# Patient Record
Sex: Female | Born: 1955 | Race: White | Hispanic: No | Marital: Single | Smoking: Current every day smoker
Health system: Southern US, Community
[De-identification: ages and names within clinical notes are randomized; demographics above are authoritative.]

## PROBLEM LIST (undated history)

## (undated) DIAGNOSIS — D649 Anemia, unspecified: Secondary | ICD-10-CM

## (undated) DIAGNOSIS — N189 Chronic kidney disease, unspecified: Secondary | ICD-10-CM

## (undated) DIAGNOSIS — E119 Type 2 diabetes mellitus without complications: Secondary | ICD-10-CM

## (undated) DIAGNOSIS — M1990 Unspecified osteoarthritis, unspecified site: Secondary | ICD-10-CM

## (undated) DIAGNOSIS — C801 Malignant (primary) neoplasm, unspecified: Secondary | ICD-10-CM

## (undated) DIAGNOSIS — M4800 Spinal stenosis, site unspecified: Secondary | ICD-10-CM

## (undated) DIAGNOSIS — Z87442 Personal history of urinary calculi: Secondary | ICD-10-CM

## (undated) DIAGNOSIS — J449 Chronic obstructive pulmonary disease, unspecified: Secondary | ICD-10-CM

## (undated) DIAGNOSIS — E039 Hypothyroidism, unspecified: Secondary | ICD-10-CM

## (undated) DIAGNOSIS — I639 Cerebral infarction, unspecified: Secondary | ICD-10-CM

## (undated) DIAGNOSIS — F319 Bipolar disorder, unspecified: Secondary | ICD-10-CM

## (undated) DIAGNOSIS — F4310 Post-traumatic stress disorder, unspecified: Secondary | ICD-10-CM

## (undated) DIAGNOSIS — I10 Essential (primary) hypertension: Secondary | ICD-10-CM

## (undated) DIAGNOSIS — F209 Schizophrenia, unspecified: Secondary | ICD-10-CM

## (undated) DIAGNOSIS — R0600 Dyspnea, unspecified: Secondary | ICD-10-CM

## (undated) DIAGNOSIS — E079 Disorder of thyroid, unspecified: Secondary | ICD-10-CM

## (undated) DIAGNOSIS — K219 Gastro-esophageal reflux disease without esophagitis: Secondary | ICD-10-CM

## (undated) DIAGNOSIS — E785 Hyperlipidemia, unspecified: Secondary | ICD-10-CM

## (undated) DIAGNOSIS — J189 Pneumonia, unspecified organism: Secondary | ICD-10-CM

## (undated) HISTORY — DX: Hyperlipidemia, unspecified: E78.5

## (undated) HISTORY — PX: TUBAL LIGATION: SHX77

## (undated) HISTORY — DX: Spinal stenosis, site unspecified: M48.00

## (undated) HISTORY — DX: Type 2 diabetes mellitus without complications: E11.9

## (undated) HISTORY — DX: Essential (primary) hypertension: I10

## (undated) HISTORY — PX: BREAST BIOPSY: SHX20

## (undated) HISTORY — DX: Bipolar disorder, unspecified: F31.9

## (undated) HISTORY — DX: Schizophrenia, unspecified: F20.9

## (undated) HISTORY — DX: Disorder of thyroid, unspecified: E07.9

## (undated) HISTORY — PX: CHOLECYSTECTOMY: SHX55

## (undated) HISTORY — DX: Post-traumatic stress disorder, unspecified: F43.10

## (undated) HISTORY — PX: ABDOMINAL HYSTERECTOMY: SHX81

## (undated) HISTORY — PX: BACK SURGERY: SHX140

---

## 1986-04-23 DIAGNOSIS — Z9889 Other specified postprocedural states: Secondary | ICD-10-CM

## 1986-04-23 HISTORY — PX: BREAST EXCISIONAL BIOPSY: SUR124

## 1986-04-23 HISTORY — DX: Other specified postprocedural states: Z98.890

## 2004-06-09 ENCOUNTER — Emergency Department: Payer: Self-pay | Admitting: Emergency Medicine

## 2004-09-12 ENCOUNTER — Emergency Department: Payer: Self-pay | Admitting: Emergency Medicine

## 2005-05-27 ENCOUNTER — Emergency Department: Payer: Self-pay | Admitting: Emergency Medicine

## 2005-05-31 ENCOUNTER — Ambulatory Visit: Payer: Self-pay | Admitting: Family Medicine

## 2005-08-13 ENCOUNTER — Ambulatory Visit: Payer: Self-pay | Admitting: Unknown Physician Specialty

## 2005-08-21 ENCOUNTER — Ambulatory Visit: Payer: Self-pay | Admitting: Unknown Physician Specialty

## 2006-02-11 ENCOUNTER — Emergency Department: Payer: Self-pay | Admitting: Emergency Medicine

## 2008-06-09 ENCOUNTER — Emergency Department: Payer: Self-pay | Admitting: Unknown Physician Specialty

## 2008-06-16 ENCOUNTER — Ambulatory Visit: Payer: Self-pay | Admitting: General Practice

## 2008-12-16 ENCOUNTER — Inpatient Hospital Stay: Payer: Self-pay | Admitting: Specialist

## 2009-09-20 ENCOUNTER — Ambulatory Visit: Payer: Self-pay | Admitting: Internal Medicine

## 2010-02-25 ENCOUNTER — Emergency Department: Payer: Self-pay | Admitting: Emergency Medicine

## 2013-10-27 ENCOUNTER — Ambulatory Visit: Payer: Self-pay | Admitting: Family Medicine

## 2013-10-27 DIAGNOSIS — I517 Cardiomegaly: Secondary | ICD-10-CM

## 2013-10-28 ENCOUNTER — Encounter: Payer: Self-pay | Admitting: Cardiovascular Disease

## 2013-10-29 ENCOUNTER — Ambulatory Visit: Payer: Self-pay | Admitting: Family Medicine

## 2013-11-04 ENCOUNTER — Ambulatory Visit: Payer: Self-pay | Admitting: Family Medicine

## 2014-01-04 ENCOUNTER — Emergency Department: Payer: Self-pay | Admitting: Emergency Medicine

## 2014-03-23 ENCOUNTER — Ambulatory Visit: Payer: Self-pay

## 2014-09-13 DIAGNOSIS — I10 Essential (primary) hypertension: Secondary | ICD-10-CM | POA: Insufficient documentation

## 2014-09-13 DIAGNOSIS — E039 Hypothyroidism, unspecified: Secondary | ICD-10-CM | POA: Insufficient documentation

## 2014-09-13 DIAGNOSIS — E1140 Type 2 diabetes mellitus with diabetic neuropathy, unspecified: Secondary | ICD-10-CM | POA: Insufficient documentation

## 2014-11-28 DIAGNOSIS — J309 Allergic rhinitis, unspecified: Secondary | ICD-10-CM | POA: Insufficient documentation

## 2014-12-13 ENCOUNTER — Other Ambulatory Visit: Payer: Self-pay | Admitting: Orthopedic Surgery

## 2014-12-13 DIAGNOSIS — M79605 Pain in left leg: Secondary | ICD-10-CM

## 2014-12-13 DIAGNOSIS — G8929 Other chronic pain: Secondary | ICD-10-CM

## 2014-12-13 DIAGNOSIS — M545 Low back pain: Principal | ICD-10-CM

## 2014-12-20 ENCOUNTER — Ambulatory Visit
Admission: RE | Admit: 2014-12-20 | Discharge: 2014-12-20 | Disposition: A | Discharge status: Home / Self Care | Payer: Medicare Other | Source: Ambulatory Visit | Attending: Orthopedic Surgery | Admitting: Orthopedic Surgery

## 2014-12-20 DIAGNOSIS — M419 Scoliosis, unspecified: Secondary | ICD-10-CM | POA: Insufficient documentation

## 2014-12-20 DIAGNOSIS — M545 Low back pain: Secondary | ICD-10-CM

## 2014-12-20 DIAGNOSIS — M79605 Pain in left leg: Secondary | ICD-10-CM

## 2014-12-20 DIAGNOSIS — M47896 Other spondylosis, lumbar region: Secondary | ICD-10-CM | POA: Diagnosis not present

## 2014-12-20 DIAGNOSIS — M5136 Other intervertebral disc degeneration, lumbar region: Secondary | ICD-10-CM | POA: Diagnosis not present

## 2014-12-20 DIAGNOSIS — G8929 Other chronic pain: Secondary | ICD-10-CM

## 2014-12-24 ENCOUNTER — Other Ambulatory Visit: Payer: Self-pay | Admitting: Family Medicine

## 2014-12-24 DIAGNOSIS — Z1231 Encounter for screening mammogram for malignant neoplasm of breast: Secondary | ICD-10-CM

## 2014-12-29 ENCOUNTER — Ambulatory Visit
Admission: RE | Admit: 2014-12-29 | Discharge: 2014-12-29 | Disposition: A | Discharge status: Home / Self Care | Payer: Medicare Other | Source: Ambulatory Visit | Attending: Family Medicine | Admitting: Family Medicine

## 2014-12-29 ENCOUNTER — Other Ambulatory Visit: Payer: Self-pay | Admitting: Family Medicine

## 2014-12-29 DIAGNOSIS — Z1231 Encounter for screening mammogram for malignant neoplasm of breast: Secondary | ICD-10-CM | POA: Insufficient documentation

## 2014-12-29 HISTORY — DX: Malignant (primary) neoplasm, unspecified: C80.1

## 2015-05-04 DIAGNOSIS — M48061 Spinal stenosis, lumbar region without neurogenic claudication: Secondary | ICD-10-CM | POA: Insufficient documentation

## 2015-06-10 ENCOUNTER — Other Ambulatory Visit: Payer: Self-pay | Admitting: Neurosurgery

## 2015-06-10 DIAGNOSIS — M545 Low back pain: Secondary | ICD-10-CM

## 2015-06-10 DIAGNOSIS — M5412 Radiculopathy, cervical region: Secondary | ICD-10-CM

## 2015-06-22 ENCOUNTER — Ambulatory Visit
Admission: RE | Admit: 2015-06-22 | Discharge: 2015-06-22 | Disposition: A | Discharge status: Home / Self Care | Payer: Medicare Other | Source: Ambulatory Visit | Attending: Neurosurgery | Admitting: Neurosurgery

## 2015-06-22 ENCOUNTER — Other Ambulatory Visit: Payer: Self-pay | Admitting: Neurosurgery

## 2015-06-22 DIAGNOSIS — M545 Low back pain: Secondary | ICD-10-CM

## 2015-06-22 DIAGNOSIS — M5412 Radiculopathy, cervical region: Secondary | ICD-10-CM

## 2015-09-23 DIAGNOSIS — K219 Gastro-esophageal reflux disease without esophagitis: Secondary | ICD-10-CM | POA: Insufficient documentation

## 2016-02-08 ENCOUNTER — Other Ambulatory Visit: Payer: Self-pay | Admitting: Family Medicine

## 2016-02-08 DIAGNOSIS — N644 Mastodynia: Secondary | ICD-10-CM

## 2016-02-08 DIAGNOSIS — Z1239 Encounter for other screening for malignant neoplasm of breast: Secondary | ICD-10-CM

## 2018-12-19 DIAGNOSIS — M25542 Pain in joints of left hand: Secondary | ICD-10-CM | POA: Insufficient documentation

## 2018-12-19 DIAGNOSIS — R197 Diarrhea, unspecified: Secondary | ICD-10-CM | POA: Insufficient documentation

## 2018-12-19 DIAGNOSIS — J302 Other seasonal allergic rhinitis: Secondary | ICD-10-CM | POA: Insufficient documentation

## 2018-12-19 DIAGNOSIS — R87619 Unspecified abnormal cytological findings in specimens from cervix uteri: Secondary | ICD-10-CM | POA: Insufficient documentation

## 2018-12-19 DIAGNOSIS — M25541 Pain in joints of right hand: Secondary | ICD-10-CM | POA: Insufficient documentation

## 2018-12-19 DIAGNOSIS — N644 Mastodynia: Secondary | ICD-10-CM | POA: Insufficient documentation

## 2018-12-19 DIAGNOSIS — R1310 Dysphagia, unspecified: Secondary | ICD-10-CM | POA: Insufficient documentation

## 2018-12-19 DIAGNOSIS — R202 Paresthesia of skin: Secondary | ICD-10-CM | POA: Insufficient documentation

## 2019-02-25 LAB — HM COLONOSCOPY

## 2019-05-20 DIAGNOSIS — K644 Residual hemorrhoidal skin tags: Secondary | ICD-10-CM | POA: Insufficient documentation

## 2019-05-20 DIAGNOSIS — K5790 Diverticulosis of intestine, part unspecified, without perforation or abscess without bleeding: Secondary | ICD-10-CM | POA: Insufficient documentation

## 2019-05-22 DIAGNOSIS — M720 Palmar fascial fibromatosis [Dupuytren]: Secondary | ICD-10-CM | POA: Insufficient documentation

## 2019-05-22 DIAGNOSIS — Z8673 Personal history of transient ischemic attack (TIA), and cerebral infarction without residual deficits: Secondary | ICD-10-CM | POA: Insufficient documentation

## 2019-05-22 DIAGNOSIS — G252 Other specified forms of tremor: Secondary | ICD-10-CM | POA: Insufficient documentation

## 2019-07-30 DIAGNOSIS — R200 Anesthesia of skin: Secondary | ICD-10-CM | POA: Insufficient documentation

## 2019-08-13 DIAGNOSIS — M5441 Lumbago with sciatica, right side: Secondary | ICD-10-CM | POA: Insufficient documentation

## 2019-08-13 DIAGNOSIS — M5416 Radiculopathy, lumbar region: Secondary | ICD-10-CM | POA: Insufficient documentation

## 2019-08-13 DIAGNOSIS — G8929 Other chronic pain: Secondary | ICD-10-CM | POA: Insufficient documentation

## 2019-08-13 DIAGNOSIS — M542 Cervicalgia: Secondary | ICD-10-CM | POA: Insufficient documentation

## 2019-08-13 DIAGNOSIS — M5412 Radiculopathy, cervical region: Secondary | ICD-10-CM | POA: Insufficient documentation

## 2019-09-09 DIAGNOSIS — Z90710 Acquired absence of both cervix and uterus: Secondary | ICD-10-CM | POA: Insufficient documentation

## 2019-09-09 DIAGNOSIS — N1832 Chronic kidney disease, stage 3b: Secondary | ICD-10-CM | POA: Insufficient documentation

## 2019-09-16 DIAGNOSIS — R200 Anesthesia of skin: Secondary | ICD-10-CM | POA: Insufficient documentation

## 2019-09-23 ENCOUNTER — Other Ambulatory Visit: Payer: Self-pay | Admitting: Physician Assistant

## 2019-09-23 DIAGNOSIS — Z780 Asymptomatic menopausal state: Secondary | ICD-10-CM

## 2019-11-11 ENCOUNTER — Other Ambulatory Visit: Payer: Self-pay

## 2019-11-11 DIAGNOSIS — I700 Atherosclerosis of aorta: Secondary | ICD-10-CM | POA: Diagnosis present

## 2019-11-11 DIAGNOSIS — E1142 Type 2 diabetes mellitus with diabetic polyneuropathy: Secondary | ICD-10-CM | POA: Diagnosis present

## 2019-11-11 DIAGNOSIS — Z20822 Contact with and (suspected) exposure to covid-19: Secondary | ICD-10-CM | POA: Diagnosis present

## 2019-11-11 DIAGNOSIS — L03311 Cellulitis of abdominal wall: Principal | ICD-10-CM | POA: Diagnosis present

## 2019-11-11 DIAGNOSIS — F209 Schizophrenia, unspecified: Secondary | ICD-10-CM | POA: Diagnosis present

## 2019-11-11 DIAGNOSIS — I10 Essential (primary) hypertension: Secondary | ICD-10-CM | POA: Diagnosis present

## 2019-11-11 DIAGNOSIS — L02211 Cutaneous abscess of abdominal wall: Secondary | ICD-10-CM | POA: Diagnosis not present

## 2019-11-11 DIAGNOSIS — Z90710 Acquired absence of both cervix and uterus: Secondary | ICD-10-CM

## 2019-11-11 DIAGNOSIS — E785 Hyperlipidemia, unspecified: Secondary | ICD-10-CM | POA: Diagnosis present

## 2019-11-11 DIAGNOSIS — F319 Bipolar disorder, unspecified: Secondary | ICD-10-CM | POA: Diagnosis present

## 2019-11-11 DIAGNOSIS — Z833 Family history of diabetes mellitus: Secondary | ICD-10-CM

## 2019-11-11 DIAGNOSIS — E039 Hypothyroidism, unspecified: Secondary | ICD-10-CM | POA: Diagnosis present

## 2019-11-11 DIAGNOSIS — Z9049 Acquired absence of other specified parts of digestive tract: Secondary | ICD-10-CM

## 2019-11-11 DIAGNOSIS — Z8673 Personal history of transient ischemic attack (TIA), and cerebral infarction without residual deficits: Secondary | ICD-10-CM

## 2019-11-11 DIAGNOSIS — E1165 Type 2 diabetes mellitus with hyperglycemia: Secondary | ICD-10-CM | POA: Diagnosis present

## 2019-11-11 DIAGNOSIS — Z8249 Family history of ischemic heart disease and other diseases of the circulatory system: Secondary | ICD-10-CM

## 2019-11-11 DIAGNOSIS — F17210 Nicotine dependence, cigarettes, uncomplicated: Secondary | ICD-10-CM | POA: Diagnosis present

## 2019-11-11 DIAGNOSIS — F4310 Post-traumatic stress disorder, unspecified: Secondary | ICD-10-CM | POA: Diagnosis present

## 2019-11-11 NOTE — ED Triage Notes (Signed)
Pt with daughter who reports pt had several wounds drained on Monday and pt to ED tonight due to wound drainage and possible infection to the right lower abdomen.

## 2019-11-12 ENCOUNTER — Inpatient Hospital Stay
Admission: EM | Admit: 2019-11-12 | Discharge: 2019-11-14 | DRG: 603 | Disposition: A | Discharge status: Home / Self Care | Payer: Medicare Other | Attending: Internal Medicine | Admitting: Internal Medicine

## 2019-11-12 ENCOUNTER — Encounter: Payer: Self-pay | Admitting: Family Medicine

## 2019-11-12 ENCOUNTER — Inpatient Hospital Stay: Payer: Medicare Other

## 2019-11-12 DIAGNOSIS — Z90710 Acquired absence of both cervix and uterus: Secondary | ICD-10-CM | POA: Diagnosis not present

## 2019-11-12 DIAGNOSIS — Z8249 Family history of ischemic heart disease and other diseases of the circulatory system: Secondary | ICD-10-CM | POA: Diagnosis not present

## 2019-11-12 DIAGNOSIS — E1140 Type 2 diabetes mellitus with diabetic neuropathy, unspecified: Secondary | ICD-10-CM | POA: Diagnosis not present

## 2019-11-12 DIAGNOSIS — I10 Essential (primary) hypertension: Secondary | ICD-10-CM

## 2019-11-12 DIAGNOSIS — E039 Hypothyroidism, unspecified: Secondary | ICD-10-CM

## 2019-11-12 DIAGNOSIS — E1142 Type 2 diabetes mellitus with diabetic polyneuropathy: Secondary | ICD-10-CM | POA: Diagnosis present

## 2019-11-12 DIAGNOSIS — E785 Hyperlipidemia, unspecified: Secondary | ICD-10-CM | POA: Diagnosis present

## 2019-11-12 DIAGNOSIS — Z833 Family history of diabetes mellitus: Secondary | ICD-10-CM | POA: Diagnosis not present

## 2019-11-12 DIAGNOSIS — E1165 Type 2 diabetes mellitus with hyperglycemia: Secondary | ICD-10-CM | POA: Diagnosis present

## 2019-11-12 DIAGNOSIS — F209 Schizophrenia, unspecified: Secondary | ICD-10-CM | POA: Diagnosis present

## 2019-11-12 DIAGNOSIS — F319 Bipolar disorder, unspecified: Secondary | ICD-10-CM | POA: Diagnosis present

## 2019-11-12 DIAGNOSIS — L03311 Cellulitis of abdominal wall: Secondary | ICD-10-CM | POA: Diagnosis present

## 2019-11-12 DIAGNOSIS — Z20822 Contact with and (suspected) exposure to covid-19: Secondary | ICD-10-CM | POA: Diagnosis present

## 2019-11-12 DIAGNOSIS — I700 Atherosclerosis of aorta: Secondary | ICD-10-CM | POA: Diagnosis present

## 2019-11-12 DIAGNOSIS — Z8673 Personal history of transient ischemic attack (TIA), and cerebral infarction without residual deficits: Secondary | ICD-10-CM | POA: Diagnosis not present

## 2019-11-12 DIAGNOSIS — L02211 Cutaneous abscess of abdominal wall: Secondary | ICD-10-CM | POA: Insufficient documentation

## 2019-11-12 DIAGNOSIS — F4310 Post-traumatic stress disorder, unspecified: Secondary | ICD-10-CM | POA: Diagnosis present

## 2019-11-12 DIAGNOSIS — Z9049 Acquired absence of other specified parts of digestive tract: Secondary | ICD-10-CM | POA: Diagnosis not present

## 2019-11-12 DIAGNOSIS — F17210 Nicotine dependence, cigarettes, uncomplicated: Secondary | ICD-10-CM | POA: Diagnosis present

## 2019-11-12 LAB — COMPREHENSIVE METABOLIC PANEL
ALT: 21 U/L (ref 0–44)
AST: 18 U/L (ref 15–41)
Albumin: 4.8 g/dL (ref 3.5–5.0)
Alkaline Phosphatase: 109 U/L (ref 38–126)
Anion gap: 8 (ref 5–15)
BUN: 13 mg/dL (ref 8–23)
CO2: 28 mmol/L (ref 22–32)
Calcium: 9.9 mg/dL (ref 8.9–10.3)
Chloride: 101 mmol/L (ref 98–111)
Creatinine, Ser: 0.97 mg/dL (ref 0.44–1.00)
GFR calc Af Amer: >60 mL/min (ref >60)
GFR calc non Af Amer: >60 mL/min (ref >60)
Glucose, Bld: 107 mg/dL — ABNORMAL HIGH (ref 70–99)
Potassium: 3.8 mmol/L (ref 3.5–5.1)
Sodium: 137 mmol/L (ref 135–145)
Total Bilirubin: 0.6 mg/dL (ref 0.3–1.2)
Total Protein: 8.2 g/dL — ABNORMAL HIGH (ref 6.5–8.1)

## 2019-11-12 LAB — BASIC METABOLIC PANEL
Anion gap: 7 (ref 5–15)
BUN: 12 mg/dL (ref 8–23)
CO2: 28 mmol/L (ref 22–32)
Calcium: 9.3 mg/dL (ref 8.9–10.3)
Chloride: 104 mmol/L (ref 98–111)
Creatinine, Ser: 0.84 mg/dL (ref 0.44–1.00)
GFR calc Af Amer: >60 mL/min (ref >60)
GFR calc non Af Amer: >60 mL/min (ref >60)
Glucose, Bld: 113 mg/dL — ABNORMAL HIGH (ref 70–99)
Potassium: 4.2 mmol/L (ref 3.5–5.1)
Sodium: 139 mmol/L (ref 135–145)

## 2019-11-12 LAB — GLUCOSE, CAPILLARY
Glucose-Capillary: 81 mg/dL (ref 70–99)
Glucose-Capillary: 178 mg/dL — ABNORMAL HIGH (ref 70–99)
Glucose-Capillary: 91 mg/dL (ref 70–99)
Glucose-Capillary: 154 mg/dL — ABNORMAL HIGH (ref 70–99)

## 2019-11-12 LAB — CBC WITH DIFFERENTIAL/PLATELET
Abs Immature Granulocytes: 0.02 10*3/uL (ref 0.00–0.07)
Basophils Absolute: 0 10*3/uL (ref 0.0–0.1)
Basophils Relative: 0 %
Eosinophils Absolute: 0 10*3/uL (ref 0.0–0.5)
Eosinophils Relative: 0 %
HCT: 39.5 % (ref 36.0–46.0)
Hemoglobin: 13.6 g/dL (ref 12.0–15.0)
Immature Granulocytes: 0 %
Lymphocytes Relative: 43 %
Lymphs Abs: 3.8 10*3/uL (ref 0.7–4.0)
MCH: 30.4 pg (ref 26.0–34.0)
MCHC: 34.4 g/dL (ref 30.0–36.0)
MCV: 88.2 fL (ref 80.0–100.0)
Monocytes Absolute: 0.6 10*3/uL (ref 0.1–1.0)
Monocytes Relative: 7 %
Neutro Abs: 4.4 10*3/uL (ref 1.7–7.7)
Neutrophils Relative %: 50 %
Platelets: 321 10*3/uL (ref 150–400)
RBC: 4.48 MIL/uL (ref 3.87–5.11)
RDW: 13.9 % (ref 11.5–15.5)
WBC: 8.9 10*3/uL (ref 4.0–10.5)
nRBC: 0 % (ref 0.0–0.2)

## 2019-11-12 LAB — CBC
HCT: 36.8 % (ref 36.0–46.0)
Hemoglobin: 12 g/dL (ref 12.0–15.0)
MCH: 29.7 pg (ref 26.0–34.0)
MCHC: 32.6 g/dL (ref 30.0–36.0)
MCV: 91.1 fL (ref 80.0–100.0)
Platelets: 297 10*3/uL (ref 150–400)
RBC: 4.04 MIL/uL (ref 3.87–5.11)
RDW: 13.7 % (ref 11.5–15.5)
WBC: 7.4 10*3/uL (ref 4.0–10.5)
nRBC: 0 % (ref 0.0–0.2)

## 2019-11-12 LAB — HEMOGLOBIN A1C
Hgb A1c MFr Bld: 8.4 % — ABNORMAL HIGH (ref 4.8–5.6)
Mean Plasma Glucose: 194.38 mg/dL

## 2019-11-12 LAB — LACTIC ACID, PLASMA: Lactic Acid, Venous: 1 mmol/L (ref 0.5–1.9)

## 2019-11-12 LAB — TSH: TSH: 3.352 u[IU]/mL (ref 0.350–4.500)

## 2019-11-12 LAB — HIV ANTIBODY (ROUTINE TESTING W REFLEX): HIV Screen 4th Generation wRfx: NONREACTIVE

## 2019-11-12 LAB — SARS CORONAVIRUS 2 BY RT PCR (HOSPITAL ORDER, PERFORMED IN ~~LOC~~ HOSPITAL LAB): SARS Coronavirus 2: NEGATIVE

## 2019-11-12 LAB — MRSA PCR SCREENING: MRSA by PCR: NEGATIVE

## 2019-11-12 MED ORDER — VANCOMYCIN HCL IN DEXTROSE 1-5 GM/200ML-% IV SOLN: 1000.0000 mg | Freq: Once | INTRAVENOUS | Status: DC

## 2019-11-12 MED ORDER — ENOXAPARIN SODIUM 40 MG/0.4ML ~~LOC~~ SOLN
40.0000 mg | SUBCUTANEOUS | Status: DC
  Administered 2019-11-12 – 2019-11-13 (×2): 40 mg via SUBCUTANEOUS
  Filled 2019-11-12 (×3): qty 0.4

## 2019-11-12 MED ORDER — ACETAMINOPHEN 650 MG RE SUPP: 650.0000 mg | Freq: Four times a day (QID) | RECTAL | Status: DC | PRN

## 2019-11-12 MED ORDER — ATORVASTATIN CALCIUM 20 MG PO TABS
80.0000 mg | ORAL_TABLET | Freq: Every day | ORAL | Status: DC
  Administered 2019-11-12 – 2019-11-14 (×3): 80 mg via ORAL
  Filled 2019-11-12 (×3): qty 4

## 2019-11-12 MED ORDER — TRAZODONE HCL 50 MG PO TABS: 25.0000 mg | ORAL_TABLET | Freq: Every evening | ORAL | Status: DC | PRN

## 2019-11-12 MED ORDER — GLIPIZIDE ER 5 MG PO TB24
5.0000 mg | ORAL_TABLET | Freq: Every day | ORAL | Status: DC
  Administered 2019-11-13 – 2019-11-14 (×2): 5 mg via ORAL
  Filled 2019-11-12 (×2): qty 1

## 2019-11-12 MED ORDER — MORPHINE SULFATE (PF) 2 MG/ML IV SOLN
2.0000 mg | INTRAVENOUS | Status: DC | PRN
  Administered 2019-11-12 – 2019-11-13 (×3): 2 mg via INTRAVENOUS
  Filled 2019-11-12 (×3): qty 1

## 2019-11-12 MED ORDER — ONDANSETRON HCL 4 MG/2ML IJ SOLN: 4.0000 mg | Freq: Four times a day (QID) | INTRAMUSCULAR | Status: DC | PRN

## 2019-11-12 MED ORDER — METOPROLOL SUCCINATE ER 50 MG PO TB24
50.0000 mg | ORAL_TABLET | Freq: Every day | ORAL | Status: DC
  Administered 2019-11-12 – 2019-11-14 (×3): 50 mg via ORAL
  Filled 2019-11-12 (×3): qty 1

## 2019-11-12 MED ORDER — FAMOTIDINE 20 MG PO TABS
40.0000 mg | ORAL_TABLET | Freq: Every day | ORAL | Status: DC
  Administered 2019-11-12 – 2019-11-14 (×3): 40 mg via ORAL
  Filled 2019-11-12 (×3): qty 2

## 2019-11-12 MED ORDER — ACETAMINOPHEN 325 MG PO TABS: 650.0000 mg | ORAL_TABLET | Freq: Four times a day (QID) | ORAL | Status: DC | PRN

## 2019-11-12 MED ORDER — TRAZODONE HCL 100 MG PO TABS
100.0000 mg | ORAL_TABLET | Freq: Every day | ORAL | Status: DC
  Administered 2019-11-12 – 2019-11-13 (×2): 100 mg via ORAL
  Filled 2019-11-12 (×2): qty 1

## 2019-11-12 MED ORDER — PIPERACILLIN-TAZOBACTAM 3.375 G IVPB 30 MIN
3.3750 g | Freq: Once | INTRAVENOUS | Status: AC
  Administered 2019-11-12: 3.375 g via INTRAVENOUS
  Filled 2019-11-12: qty 50

## 2019-11-12 MED ORDER — LAMOTRIGINE 100 MG PO TABS
200.0000 mg | ORAL_TABLET | Freq: Two times a day (BID) | ORAL | Status: DC
  Administered 2019-11-12 – 2019-11-14 (×5): 200 mg via ORAL
  Filled 2019-11-12 (×5): qty 2

## 2019-11-12 MED ORDER — GABAPENTIN 400 MG PO CAPS
800.0000 mg | ORAL_CAPSULE | Freq: Three times a day (TID) | ORAL | Status: DC
  Administered 2019-11-12 – 2019-11-14 (×5): 800 mg via ORAL
  Filled 2019-11-12 (×5): qty 2

## 2019-11-12 MED ORDER — LEVOTHYROXINE SODIUM 100 MCG PO TABS
100.0000 ug | ORAL_TABLET | Freq: Every day | ORAL | Status: DC
  Administered 2019-11-12 – 2019-11-14 (×3): 100 ug via ORAL
  Filled 2019-11-12: qty 2
  Filled 2019-11-12 (×2): qty 1

## 2019-11-12 MED ORDER — VANCOMYCIN HCL IN DEXTROSE 1-5 GM/200ML-% IV SOLN: 1000.0000 mg | INTRAVENOUS | Status: DC

## 2019-11-12 MED ORDER — VENLAFAXINE HCL ER 75 MG PO CP24
75.0000 mg | ORAL_CAPSULE | Freq: Every day | ORAL | Status: DC
  Administered 2019-11-13: 75 mg via ORAL
  Filled 2019-11-12: qty 1

## 2019-11-12 MED ORDER — SODIUM CHLORIDE 0.9 % IV SOLN
1.0000 g | Freq: Once | INTRAVENOUS | Status: AC
  Administered 2019-11-12: 1 g via INTRAVENOUS
  Filled 2019-11-12: qty 10

## 2019-11-12 MED ORDER — VARENICLINE TARTRATE 1 MG PO TABS
1.0000 mg | ORAL_TABLET | Freq: Two times a day (BID) | ORAL | Status: DC
  Administered 2019-11-12 – 2019-11-14 (×4): 1 mg via ORAL
  Filled 2019-11-12 (×5): qty 1

## 2019-11-12 MED ORDER — ONDANSETRON HCL 4 MG/2ML IJ SOLN
4.0000 mg | Freq: Once | INTRAMUSCULAR | Status: AC
  Administered 2019-11-12: 4 mg via INTRAVENOUS
  Filled 2019-11-12: qty 2

## 2019-11-12 MED ORDER — PIPERACILLIN-TAZOBACTAM 3.375 G IVPB
3.3750 g | Freq: Three times a day (TID) | INTRAVENOUS | Status: DC
  Administered 2019-11-12 – 2019-11-14 (×5): 3.375 g via INTRAVENOUS
  Filled 2019-11-12 (×5): qty 50

## 2019-11-12 MED ORDER — INSULIN ASPART 100 UNIT/ML ~~LOC~~ SOLN
0.0000 [IU] | Freq: Three times a day (TID) | SUBCUTANEOUS | Status: DC
  Administered 2019-11-12 (×2): 3 [IU] via SUBCUTANEOUS
  Administered 2019-11-13: 2 [IU] via SUBCUTANEOUS
  Administered 2019-11-13: 3 [IU] via SUBCUTANEOUS
  Administered 2019-11-14: 2 [IU] via SUBCUTANEOUS
  Filled 2019-11-12 (×5): qty 1

## 2019-11-12 MED ORDER — VANCOMYCIN HCL 1250 MG/250ML IV SOLN
1250.0000 mg | INTRAVENOUS | Status: DC
  Administered 2019-11-12 – 2019-11-13 (×2): 1250 mg via INTRAVENOUS
  Filled 2019-11-12 (×3): qty 250

## 2019-11-12 MED ORDER — MORPHINE SULFATE (PF) 4 MG/ML IV SOLN
4.0000 mg | Freq: Once | INTRAVENOUS | Status: AC
  Administered 2019-11-12: 4 mg via INTRAVENOUS
  Filled 2019-11-12: qty 1

## 2019-11-12 MED ORDER — SODIUM CHLORIDE 0.9 % IV SOLN: INTRAVENOUS | Status: DC

## 2019-11-12 MED ORDER — ONDANSETRON HCL 4 MG PO TABS: 4.0000 mg | ORAL_TABLET | Freq: Four times a day (QID) | ORAL | Status: DC | PRN

## 2019-11-12 MED ORDER — ALBUTEROL SULFATE (2.5 MG/3ML) 0.083% IN NEBU: 3.0000 mL | INHALATION_SOLUTION | Freq: Four times a day (QID) | RESPIRATORY_TRACT | Status: DC | PRN

## 2019-11-12 MED ORDER — FLUTICASONE PROPIONATE 50 MCG/ACT NA SUSP
2.0000 | Freq: Every day | NASAL | Status: DC
  Administered 2019-11-12 – 2019-11-14 (×3): 2 via NASAL
  Filled 2019-11-12: qty 16

## 2019-11-12 MED ORDER — IOHEXOL 300 MG/ML  SOLN
100.0000 mL | Freq: Once | INTRAMUSCULAR | Status: AC | PRN
  Administered 2019-11-12: 100 mL via INTRAVENOUS
  Filled 2019-11-12: qty 100

## 2019-11-12 MED ORDER — BUSPIRONE HCL 15 MG PO TABS
15.0000 mg | ORAL_TABLET | Freq: Three times a day (TID) | ORAL | Status: DC
  Administered 2019-11-12 – 2019-11-14 (×6): 15 mg via ORAL
  Filled 2019-11-12 (×6): qty 1
  Filled 2019-11-12: qty 3
  Filled 2019-11-12 (×2): qty 1

## 2019-11-12 MED ORDER — MAGNESIUM HYDROXIDE 400 MG/5ML PO SUSP: 30.0000 mL | Freq: Every day | ORAL | Status: DC | PRN

## 2019-11-12 MED ORDER — VANCOMYCIN HCL IN DEXTROSE 1-5 GM/200ML-% IV SOLN
1000.0000 mg | Freq: Once | INTRAVENOUS | Status: AC
  Administered 2019-11-12: 1000 mg via INTRAVENOUS
  Filled 2019-11-12: qty 200

## 2019-11-12 MED ORDER — OXYCODONE HCL 5 MG PO TABS
5.0000 mg | ORAL_TABLET | ORAL | Status: DC | PRN
  Administered 2019-11-12 – 2019-11-14 (×3): 5 mg via ORAL
  Filled 2019-11-12 (×3): qty 1

## 2019-11-12 NOTE — H&P (Addendum)
[place] [facility] [place] NAME: Mary Nicholson    MR#:  [id number]  DATE OF BIRTH:  [birth year]  DATE OF ADMISSION:  11/12/2019  PRIMARY CARE PHYSICIAN: [facility]   REQUESTING/REFERRING PHYSICIAN: Gonzella Lex, MD  CHIEF COMPLAINT:   Chief Complaint  Patient presents with  . Wound Infection    HISTORY OF PRESENT ILLNESS:  Mary Nicholson  is a 64 y.o. Caucasian female with a known history of type diabetes mellitus, hypertension, dyslipidemia and TIA, presented to the emergency room with acute onset of worsening abdominal wall cellulitis with erythema, warmth, tenderness, pain which have started several days ago and she underwent incision and drainage of an associated abscess on 7/19 with wound packing.  She denies any fever or chills.  She is given prescription for Bactrim DS that she has been taking.  No nausea or vomiting or diarrhea.  No chest pain or dyspnea palpitations or cough or wheezing.  Upon presentation to the emergency room, blood pressure was 188/87 and otherwise vital signs are within normal.  CMP is unremarkable.  COVID-19 PCR came back negative.  Blood cultures were drawn.  The patient was given IV ceftriaxone and vancomycin as well as 4 mg of IV morphine sulfate and 4 mg IV Zofran.  She will be admitted to a medical bed for further evaluation and management. PAST MEDICAL HISTORY:   Past Medical History:  Diagnosis Date  . Cancer    cervical  Type 2 diabetes mellitus, TIA, hypertension, dyslipidemia, hypothyroidism, PTSD, bipolar disorder and schizophrenia.  PAST SURGICAL HISTORY:   Past Surgical History:  Procedure Laterality Date  . BREAST BIOPSY Right    negative 1988  Bilateral tubal ligation, hysterectomy, cholecystectomy and back surgery.  SOCIAL HISTORY:   Social History   Tobacco Use  . Smoking status: Not on file  Substance Use Topics  . Alcohol use: Not on file  She occasionally  drinks beer.  She smokes 1 to 2 cigarettes/day using Chantix for cessation  FAMILY HISTORY:   Positive  for hypertension, diabetes mellitus, CVA, MI and cancer.  DRUG ALLERGIES:  No Known Allergies  REVIEW OF SYSTEMS:   ROS As per history of present illness. All pertinent systems were reviewed above. Constitutional, HEENT, cardiovascular, respiratory, GI, GU, musculoskeletal, neuro, psychiatric, endocrine, integumentary and hematologic systems were reviewed and are otherwise negative/unremarkable except for positive findings mentioned above in the HPI.   MEDICATIONS AT HOME:   Prior to Admission medications   Not on File      VITAL SIGNS:  Blood pressure (!) 188/87, pulse 78, temperature 97.7 F (36.5 C), temperature source Oral, resp. rate 16, SpO2 99 %.  PHYSICAL EXAMINATION:  Physical Exam  GENERAL:  64 y.o.-year-old Caucasian female patient lying in the bed with no acute distress.  EYES: Pupils equal, round, reactive to light and accommodation. No scleral icterus. Extraocular muscles intact.  HEENT: Head atraumatic, normocephalic. Oropharynx and nasopharynx clear.  NECK:  Supple, no jugular venous distention. No thyroid enlargement, no tenderness.  LUNGS: Normal breath sounds bilaterally, no wheezing, rales, rhonchi or crepitation. No use of accessory muscles of respiration.  CARDIOVASCULAR: Regular rate and rhythm, S1, S2 normal. No murmurs, rubs, or gallops.  ABDOMEN/skin: Soft, with lower abdominal wall erythema, induration, warmth and tenderness with abdominal wound status post I&D of an abscess that is currently packed.  She continues to have significant induration and tenderness around her abscess wound.  Bowel sounds present.  No organomegaly or mass.  EXTREMITIES: No pedal edema, cyanosis, or clubbing.  NEUROLOGIC: Cranial nerves II through XII are intact. Muscle strength 5/5 in all extremities. Sensation intact. Gait not checked.  PSYCHIATRIC: The patient is alert  and oriented x 3.  Normal affect and good eye contact. SKIN: As above.    LABORATORY PANEL:   CBC Recent Labs  Lab 11/12/19 0015  WBC 8.9  HGB 13.6  HCT 39.5  PLT 321   ------------------------------------------------------------------------------------------------------------------  Chemistries  Recent Labs  Lab 11/12/19 0015  NA 137  K 3.8  CL 101  CO2 28  GLUCOSE 107*  BUN 13  CREATININE 0.97  CALCIUM 9.9  AST 18  ALT 21  ALKPHOS 109  BILITOT 0.6   ------------------------------------------------------------------------------------------------------------------  Cardiac Enzymes No results for input(s): TROPONINI in the last 168 hours. ------------------------------------------------------------------------------------------------------------------  RADIOLOGY:  No results found.    IMPRESSION AND PLAN:   1.  Abdominal wall nonpurulent cellulitis which has been extending despite outpatient treatment with associated abscess status post incision and drainage. -The patient will be admitted to a medical bed. -Continue antibiotic therapy with IV vancomycin and Zosyn given her diabetes and immunosuppression. -Pain management to be provided. -Warm compresses will be utilized. -Wound care consult to be obtained. -General surgery consultation will be obtained. -I notified Dr. Celine Ahr about the patient.  2.  Type 2 diabetes mellitus with peripheral neuropathy. -We will continue Glucotrol XL and place the patient on supplemental coverage with NovoLog. -We will continue Neurontin. -Metformin will be held off.  3.  Hypertension. -We will continue her antihypertensives.  4.  Hypothyroidism. -Continue Synthroid and check TSH level.  5.  Bipolar disorder. -We will continue Lamictal.  6.  DVT prophylaxis. -Subtenons Lovenox   All the records are reviewed and case discussed with ED provider. The plan of care was discussed in details with the patient (and  family). I answered all questions. The patient agreed to proceed with the above mentioned plan. Further management will depend upon hospital course.   CODE STATUS: Full code  Status is: Inpatient  Remains inpatient appropriate because:Ongoing active pain requiring inpatient pain management, Ongoing diagnostic testing needed not appropriate for outpatient work up, Unsafe d/c plan, IV treatments appropriate due to intensity of illness or inability to take PO and Inpatient level of care appropriate due to severity of illness   Dispo: The patient is from: Home              Anticipated d/c is to: Home              Anticipated d/c date is: 2 days              Patient currently is not medically stable to d/c.   TOTAL TIME TAKING CARE OF THIS PATIENT: 55 minutes.    Christel Mormon M.D on 11/12/2019 at 3:33 AM  [facility]   From 7 PM-7 AM, contact night-coverage [email or web address]  CC: Primary care physician; Shari Prows, [facility]   Note: This dictation was prepared with Dragon dictation along with smaller phrase technology. Any transcriptional typo errors that result from this process are unintentional.

## 2019-11-12 NOTE — Progress Notes (Addendum)
Pharmacy Antibiotic Note  Mary Nicholson is a 64 y.o. female admitted on 11/12/2019 with cellulitis.  Pharmacy has been consulted for Vancomycin and Zosyn dosing.  Plan:  Continue Zosyn 3.375g IV q8h (4 hour infusion).   Will start Vancomycin 1250 mg IV Q 24 hrs based on [place] vancomycin nomogram.  Height: 5' (152.4 cm) Weight: 65.9 kg (145 lb 4.5 oz) IBW/kg (Calculated) : 45.5  Temp (24hrs), Avg:97.7 F (36.5 C), Min:97.7 F (36.5 C), Max:97.7 F (36.5 C)  Recent Labs  Lab 11/12/19 0015 11/12/19 0441  WBC 8.9 7.4  CREATININE 0.97 0.84  LATICACIDVEN 1.0  --     Estimated Creatinine Clearance: 55.8 mL/min (by C-G formula based on SCr of 0.84 mg/dL).    No Known Allergies  Antimicrobials this admission:  7/22 vancomycin >>   7/22 Zosyn >>   7/22 ceftriaxone x 1  Dose adjustments this admission:   Microbiology results:  BCx: NGTD  Thank you for allowing pharmacy to be a part of this patient's care.  Benn Moulder, PharmD Pharmacy Resident  11/12/2019 8:46 AM

## 2019-11-12 NOTE — ED Notes (Signed)
PT assisted to the bathroom. Able to stand with assistance.

## 2019-11-12 NOTE — ED Notes (Signed)
Family showed RN pictures of wound infection. Redness around lower abd appears similar to when wound was opened and packed. Immediately around wound opening appears a darker color of red with yellow drainage that was not present yesterday. Pt reports she has been compliant with her antibiotics. No fevers at home but pain has been worsening.

## 2019-11-12 NOTE — ED Notes (Signed)
Pharmacy tech informed of need for med rec.

## 2019-11-12 NOTE — Progress Notes (Signed)
Patient ID: Mary Nicholson, female   DOB: 03-23-1956, 64 y.o.   MRN: [id number] [facility] PROGRESS NOTE  Mary Nicholson WUJ:[id number] DOB: February 28, 1956 DOA: 11/12/2019 PCP: Hortencia Pilar, MD  HPI/Subjective: The patient coming in for worsening pain and redness spreading around the initial site of incision and drainage done on the 19th.  Patient stated this started off as a single bump and they tried to take care of her at home for a few weeks but then got worse and had it drained at the urgent care center.  It worsened over the last couple days and came into the hospital last night.  IV antibiotics were started.  Patient also noticed a couple spots under her breast and on her neck.  Objective: Vitals:   11/12/19 0730 11/12/19 0758  BP: (!) 124/56 (!) 128/49  Pulse: 75 72  Resp:    Temp:  97.7 F (36.5 C)  SpO2: 94% 95%    Intake/Output Summary (Last 24 hours) at 11/12/2019 1241 Last data filed at 11/12/2019 0420 Gross per 24 hour  Intake 291.01 ml  Output --  Net 291.01 ml   Filed Weights   11/12/19 0500  Weight: 65.9 kg    ROS: Review of Systems  Respiratory: Negative for shortness of breath.   Cardiovascular: Negative for chest pain.  Gastrointestinal: Negative for abdominal pain.  Musculoskeletal: Positive for joint pain.   Exam: Physical Exam HENT:     Nose: No mucosal edema.     Mouth/Throat:     Pharynx: No oropharyngeal exudate.  Eyes:     General: Lids are normal.     Conjunctiva/sclera: Conjunctivae normal.     Pupils: Pupils are equal, round, and reactive to light.  Cardiovascular:     Rate and Rhythm: Normal rate and regular rhythm.     Heart sounds: Normal heart sounds, S1 normal and S2 normal.  Pulmonary:     Breath sounds: No decreased breath sounds, wheezing, rhonchi or rales.  Abdominal:     Palpations: Abdomen is soft.     Tenderness: There is no abdominal tenderness.  Musculoskeletal:     Right ankle: No swelling.     Left  ankle: No swelling.  Skin:    General: Skin is warm.     Comments: Right lower abdomen area of incision and drainage with surrounding erythema.  Erythema inside the purple outline. One small little boil on the back of her neck.  Quite a few small boils on under her left breast but very small.  Neurological:     Mental Status: She is alert and oriented to person, place, and time.       Data Reviewed: Basic Metabolic Panel: Recent Labs  Lab 11/12/19 0015 11/12/19 0441  NA 137 139  K 3.8 4.2  CL 101 104  CO2 28 28  GLUCOSE 107* 113*  BUN 13 12  CREATININE 0.97 0.84  CALCIUM 9.9 9.3   Liver Function Tests: Recent Labs  Lab 11/12/19 0015  AST 18  ALT 21  ALKPHOS 109  BILITOT 0.6  PROT 8.2*  ALBUMIN 4.8   CBC: Recent Labs  Lab 11/12/19 0015 11/12/19 0441  WBC 8.9 7.4  NEUTROABS 4.4  --   HGB 13.6 12.0  HCT 39.5 36.8  MCV 88.2 91.1  PLT 321 297    CBG: Recent Labs  Lab 11/12/19 0806 11/12/19 1221  GLUCAP 81 178*    Recent Results (from the past 240 hour(s))  Blood culture (routine x  2)     Status: None (Preliminary result)   Collection Time: 11/12/19 12:15 AM   Specimen: Right Antecubital; Blood  Result Value Ref Range Status   Specimen Description RIGHT ANTECUBITAL  Final   Special Requests   Final    BOTTLES DRAWN AEROBIC ONLY Blood Culture adequate volume   Culture   Final    NO GROWTH <12 HOURS Performed at [facility], [address] [place], [place] [address]    Report Status PENDING  Incomplete  SARS Coronavirus 2 by RT PCR (hospital order, performed in [place] hospital lab) Nasopharyngeal Nasopharyngeal Swab     Status: None   Collection Time: 11/12/19  4:41 AM   Specimen: Nasopharyngeal Swab  Result Value Ref Range Status   SARS Coronavirus 2 NEGATIVE NEGATIVE Final    Comment: (NOTE) SARS-CoV-2 target nucleic acids are NOT DETECTED.  The SARS-CoV-2 RNA is generally detectable in upper and lower respiratory specimens  during the acute phase of infection. The lowest concentration of SARS-CoV-2 viral copies this assay can detect is 250 copies / mL. A negative result does not preclude SARS-CoV-2 infection and should not be used as the sole basis for treatment or other patient management decisions.  A negative result may occur with improper specimen collection / handling, submission of specimen other than nasopharyngeal swab, presence of viral mutation(s) within the areas targeted by this assay, and inadequate number of viral copies (<250 copies / mL). A negative result must be combined with clinical observations, patient history, and epidemiological information.  Fact Sheet for Patients:   StrictlyIdeas.no  Fact Sheet for Healthcare Providers: BankingDealers.co.za  This test is not yet approved or  cleared by the [place] FDA and has been authorized for detection and/or diagnosis of SARS-CoV-2 by FDA under an Emergency Use Authorization (EUA).  This EUA will remain in effect (meaning this test can be used) for the duration of the COVID-19 declaration under Section 564(b)(1) of the Act, 21 U.S.C. section 360bbb-3(b)(1), unless the authorization is terminated or revoked sooner.  Performed at [facility], [address], [place], [place] [address]      Studies: CT ABDOMEN PELVIS W CONTRAST  Result Date: 11/12/2019 CLINICAL DATA:  Abdominal pain. Wound infection EXAM: CT ABDOMEN AND PELVIS WITH CONTRAST TECHNIQUE: Multidetector CT imaging of the abdomen and pelvis was performed using the standard protocol following bolus administration of intravenous contrast. CONTRAST:  161mL OMNIPAQUE IOHEXOL 300 MG/ML  SOLN COMPARISON:  None. FINDINGS: Lower chest: The lung bases are clear of an acute process. No pleural effusions or pulmonary lesions. The heart is normal in size. No pericardial effusion. Hepatobiliary: No hepatic lesions are identified.  The gallbladder is surgically absent. No intra or extrahepatic biliary dilatation. Pancreas: No mass, inflammation or ductal dilatation. Spleen: Normal size. No focal lesions. Adrenals/Urinary Tract: Adrenal glands and kidneys are unremarkable. The bladder is unremarkable. Stomach/Bowel: The stomach, duodenum, small bowel and colon are unremarkable. No acute inflammatory changes, mass lesions or obstructive findings. The terminal ileum and appendix are normal. Moderate colonic diverticulosis but no findings for acute diverticulitis. Vascular/Lymphatic: Age advanced atherosclerotic calcifications involving the aorta and iliac arteries. No aneurysm. The branch vessels are patent. The major venous structures are patent. No mesenteric or retroperitoneal mass or adenopathy. Reproductive: Surgically absent. Other: No pelvic mass or adenopathy. No free pelvic fluid collections. No inguinal mass or adenopathy. No abdominal wall hernia or subcutaneous lesions. There is an open wound noted involving the lower right abdominal wall at  the level of the mid iliac crest. Associated surrounding skin thickening and underlying cellulitis. No discrete rim enhancing fluid collection to suggest a drainable abscess. I do not see any obvious changes of myofasciitis involving the underlying oblique abdominal muscles. Musculoskeletal: No significant bony findings. 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 IMPRESSION: 1. Open wound involving the lower right abdominal wall at the level of the mid iliac crest with surrounding skin thickening and underlying cellulitis. No discrete rim enhancing fluid collection to suggest a drainable abscess. 2. No acute abdominal/pelvic findings, mass lesions or adenopathy. 3. Status post cholecystectomy. No biliary dilatation. 4. Age advanced atherosclerotic calcifications involving the aorta and iliac arteries. Aortic Atherosclerosis (ICD10-I70.0). Electronically  Signed   By: Marijo Sanes M.D.   On: 11/12/2019 09:18    Scheduled Meds: . atorvastatin  80 mg Oral Daily  . busPIRone  15 mg Oral TID  . enoxaparin (LOVENOX) injection  40 mg Subcutaneous Q24H  . famotidine  40 mg Oral Daily  . fluticasone  2 spray Each Nare Daily  . gabapentin  800 mg Oral TID  . insulin aspart  0-15 Units Subcutaneous TID PC & HS  . lamoTRIgine  200 mg Oral BID  . levothyroxine  100 mcg Oral Daily  . metoprolol succinate  50 mg Oral Daily  . traZODone  100 mg Oral QHS  . varenicline  1 mg Oral BID  . venlafaxine XR  75 mg Oral Q breakfast   Continuous Infusions: . sodium chloride 100 mL/hr at 11/12/19 0907  . piperacillin-tazobactam (ZOSYN)  IV    . vancomycin      Assessment/Plan:  1. Abdominal wall abscess and cellulitis.  Failed outpatient treatment.  Since the patient does have some smaller lesions on her neck and under her left breast I am thinking that this may be MRSA.  Patient on vancomycin and Zosyn.  Continue to monitor wounds.  Appreciate surgical consultation.  2. Type 2 diabetes mellitus with peripheral neuropathy.  Patient on sliding scale insulin.  We will continue Glucotrol.  Patient also on gabapentin. 3. Hyperlipidemia unspecified on atorvastatin 4. Hypothyroidism unspecified on levothyroxine 5. Bipolar disorder continue psychiatric medication     Code Status:     Code Status Orders  (From admission, onward)         Start     Ordered   11/12/19 0331  Full code  Continuous        11/12/19 0333        Code Status History    This patient has a current code status but no historical code status.   Advance Care Planning Activity     Family Communication: Family at bedside Disposition Plan: Status is: Inpatient  Dispo: The patient is from: Home              Anticipated d/c is to: Home              Anticipated d/c date is: Likely will need another day or so of IV antibiotics since failed outpatient therapy              Patient  currently receiving IV antibiotics for abscess and cellulitis.  Consultants:  General surgery  Antibiotics:  Vancomycin  Zosyn  Time spent: 27 minutes  [facility]

## 2019-11-12 NOTE — ED Provider Notes (Addendum)
[facility] Emergency Department Provider Note  ____________________________________________  Time seen: Approximately 3:07 AM  I have reviewed the triage vital signs and the nursing notes.   HISTORY  Chief Complaint Wound Infection   HPI Mary Nicholson is a 64 y.o. female with a history of type 2 diabetes who presents for evaluation of cellulitis.  Patient reports having an abscess in her abdominal wall for 2-1/2 weeks  which was drained 2 days ago at urgent care.  Patient was placed on Bactrim.  She reports that the infection has gotten worse, the redness is spreading, she is complaining of 10 out of 10 sharp pain that is constant and nonradiating.  Her sugars have been normal, no fever or chills, no nausea or vomiting.  Past Medical History:  Diagnosis Date  . Cancer    cervical    There are no problems to display for this patient.   Past Surgical History:  Procedure Laterality Date  . BREAST BIOPSY Right    negative 1988    Prior to Admission medications   Not on File    Allergies Patient has no known allergies.  No family history on file.  Social History Social History   Tobacco Use  . Smoking status: Not on file  Substance Use Topics  . Alcohol use: Not on file  . Drug use: Not on file    Review of Systems  Constitutional: Negative for fever. Eyes: Negative for visual changes. ENT: Negative for sore throat. Neck: No neck pain  Cardiovascular: Negative for chest pain. Respiratory: Negative for shortness of breath. Gastrointestinal: Negative for abdominal pain, vomiting or diarrhea. Genitourinary: Negative for dysuria. Musculoskeletal: Negative for back pain. Skin: Negative for rash. + abscess Neurological: Negative for headaches, weakness or numbness. Psych: No SI or HI  ____________________________________________   PHYSICAL EXAM:  VITAL SIGNS: ED Triage Vitals [11/12/19 0241]  Enc Vitals Group     BP  (!) 188/87     Pulse Rate 78     Resp 16     Temp 97.7 F (36.5 C)     Temp Source Oral     SpO2 99 %     Weight      Height      Head Circumference      Peak Flow      Pain Score      Pain Loc      Pain Edu?      Excl. in [place]?     Constitutional: Alert and oriented. Well appearing and in no apparent distress. HEENT:      Head: Normocephalic and atraumatic.         Eyes: Conjunctivae are normal. Sclera is non-icteric.       Mouth/Throat: Mucous membranes are moist.       Neck: Supple with no signs of meningismus. Cardiovascular: Regular rate and rhythm. No murmurs, gallops, or rubs. 2+ symmetrical distal pulses are present in all extremities. No JVD. Respiratory: Normal respiratory effort. Lungs are clear to auscultation bilaterally. No wheezes, crackles, or rhonchi.  Gastrointestinal: Soft, and non distended. There is a large area of erythema, induration, and warmth on the lower abdomen, there is a packed drained abscess in the center, very tender to palpation, no crepitus or bullae. Musculoskeletal: No edema, cyanosis, or erythema of extremities. Neurologic: Normal speech and language. Face is symmetric. Moving all extremities. No gross focal neurologic deficits are appreciated. Skin: Skin is warm, dry and intact. No rash noted. Psychiatric:  Mood and affect are normal. Speech and behavior are normal.  ____________________________________________   LABS (all labs ordered are listed, but only abnormal results are displayed)  Labs Reviewed  COMPREHENSIVE METABOLIC PANEL - Abnormal; Notable for the following components:      Result Value   Glucose, Bld 107 (*)    Total Protein 8.2 (*)    All other components within normal limits  CULTURE, BLOOD (ROUTINE X 2)  CULTURE, BLOOD (ROUTINE X 2)  LACTIC ACID, PLASMA  CBC WITH DIFFERENTIAL/PLATELET   ____________________________________________  EKG  none  ____________________________________________  RADIOLOGY  none   ____________________________________________   PROCEDURES  Procedure(s) performed:yes .1-3 Lead EKG Interpretation Performed by: Rudene Re, MD Authorized by: Rudene Re, MD     Interpretation: normal     ECG rate assessment: normal     Rhythm: sinus rhythm     Ectopy: none     Critical Care performed:  None ____________________________________________   INITIAL IMPRESSION / ASSESSMENT AND PLAN / ED COURSE   64 y.o. female with a history of type 2 diabetes who presents for evaluation of abdominal wall cellulitis which resulted from an abscess which was drained 2 days ago.  Patient has failed outpatient antibiotics and the infection is getting progressively worse.  No signs of sepsis with no tachycardia, no fever, no leukocytosis and normal lactate.  Area was demarcated.  No fluid collection requiring drainage at this time.  The previous abscess is still packed.  There is no crepitus or bulla with no signs of necrotizing infection.  Will cover broadly with Rocephin and vancomycin.  Patient given IV morphine for pain.  Will discuss with the hospitalist for admission.  History gathered from patient and her daughter who is at bedside.  Plan discussed with both of them.  Old medical records reviewed.      _____________________________________________ Please note:  Patient was evaluated in Emergency Department today for the symptoms described in the history of present illness. Patient was evaluated in the context of the global COVID-19 pandemic, which necessitated consideration that the patient might be at risk for infection with the SARS-CoV-2 virus that causes COVID-19. Institutional protocols and algorithms that pertain to the evaluation of patients at risk for COVID-19 are in a state of rapid change based on information released by regulatory bodies including the CDC and federal and state organizations. These policies and algorithms were followed during the patient's care  in the ED.  Some ED evaluations and interventions may be delayed as a result of limited staffing during the pandemic.   [place] Controlled Substance Database was reviewed by me. ____________________________________________   FINAL CLINICAL IMPRESSION(S) / ED DIAGNOSES   Final diagnoses:  Abdominal wall cellulitis  Abdominal wall abscess      NEW MEDICATIONS STARTED DURING THIS VISIT:  ED Discharge Orders    None       Note:  This document was prepared using Dragon voice recognition software and may include unintentional dictation errors.    Alfred Levins, [place], MD 11/12/19 [place], [place], MD 11/12/19 [phone]

## 2019-11-12 NOTE — Consult Note (Addendum)
WOC consult requested for abd abscess site prior to surgical team involvement. Plan: Topical treatment orders provided for bedside nurses to perform daily as follows until further input is available for the surgical team: Apply moist gauze packing to abd wound Q day, using swab to fill, then cover with ABD and and tape. Surgical team is now following for assessment and plan of care; please refer to their team for further questions. Please re-consult if further assistance is needed.  Thank-you,  Julien Girt MSN, [place], [place], [place], [place]

## 2019-11-12 NOTE — Progress Notes (Signed)
°   11/12/19 1800  Clinical Encounter Type  Visited With Patient and family together  Visit Type Initial  Referral From Nurse  Consult/Referral To Chaplain  Spiritual Encounters  Spiritual Needs Brochure  Chaplain received OR for AD and visit patient. Family member was at bedside. Chaplain educated patient and ask patient to have Chaplain page if she wanted to complete the AD.

## 2019-11-12 NOTE — ED Notes (Signed)
No second culture needed, per Alfred Levins MD.

## 2019-11-12 NOTE — Progress Notes (Signed)
Pharmacy Antibiotic Note  Carah Barrientes is a 64 y.o. female admitted on 11/12/2019 with cellulitis.  Pharmacy has been consulted for Vancomycin and Zosyn dosing.  Plan: Zosyn 3.375g IV q8h (4 hour infusion).   Vancomycin 1000 mg IV Q 24 hrs. Goal AUC 400-550. Expected AUC: 482.5, Css min 11.9 SCr used: 0.84      Temp (24hrs), Avg:97.7 F (36.5 C), Min:97.7 F (36.5 C), Max:97.7 F (36.5 C)  Recent Labs  Lab 11/12/19 0015 11/12/19 0441  WBC 8.9 7.4  CREATININE 0.97 0.84  LATICACIDVEN 1.0  --     CrCl cannot be calculated (Unknown ideal weight.).    No Known Allergies  Antimicrobials this admission:   >>    >>   Dose adjustments this admission:   Microbiology results:  BCx:   UCx:    Sputum:    MRSA PCR:   Thank you for allowing pharmacy to be a part of this patient's care.  Hart Robinsons A 11/12/2019 5:38 AM

## 2019-11-12 NOTE — ED Notes (Signed)
MD at bedside. 

## 2019-11-12 NOTE — Consult Note (Addendum)
ADDENDUM 9:21 AM  CT Abdomen/Pelvis personally reviewed, inflammatory changes to right abdominal wall, no abscess or evidence of necrotizing infection present, and radiologist report reviewed below:  IMPRESSION: 1. Open wound involving the lower right abdominal wall at the level of the mid iliac crest with surrounding skin thickening and underlying cellulitis. No discrete rim enhancing fluid collection to suggest a drainable abscess. 2. No acute abdominal/pelvic findings, mass lesions or adenopathy. 3. Status post cholecystectomy. No biliary dilatation. 4. Age advanced atherosclerotic calcifications involving the aorta and iliac arteries.  Updated Plan:  -- Reviewed CT with patient and family -- No need for emergent surgical intervention -- Okay for diet -- I will obtain wound culture, irrigate with NS, and pack with 1/2 inch iodoform gauze. This should be done daily + prn -- Continue ABx -- Okay to resume DVT prophylaxis     [facility] SURGICAL CONSULTATION NOTE (initial) - cpt: [id number]  HISTORY OF PRESENT ILLNESS (HPI):  64 y.o. female presented to [facility] ED today for evaluation of abdominal wound infection. Patient reports that she has had a persistent abscess in her abdominal wall over the last ~2 weeks. This seemed to gradually progress over that time frame. Nothing seemed to make this better. No fever at home. She was seen at an UC for this on 07/19. Incision and drainage was preformed there and she was started on 10 days of Bactrim and Doxycycline. No cultures were obtained. However, she feels this area has continued to worsen and progress. She still reports significant 10/10 sharp pain and progressive erythema. No other associated symptoms. She does have a history of T2DM but reports sugars are controlled. Work up in the ED was reassuring and she was without fever, leukocytosis, lactic acidosis, or significant hyperglycemia. BCx were obtained and there was no growth to  date. She was admitted to the medicine service and started on Vancomycin/Zosyn.   Surgery is consulted by hospitalist physician Dr. Eugenie Norrie, MD in this context for evaluation and management of abdominal wound infection.   PAST MEDICAL HISTORY ([facility]):  Past Medical History:  Diagnosis Date   Cancer    cervical     PAST SURGICAL HISTORY ([place]):  Past Surgical History:  Procedure Laterality Date   BREAST BIOPSY Right    negative 1988     MEDICATIONS:  Prior to Admission medications   Not on File     ALLERGIES:  No Known Allergies   SOCIAL HISTORY:  Social History   Socioeconomic History   Marital status: Single    Spouse name: Not on file   Number of children: Not on file   Years of education: Not on file   Highest education level: Not on file  Occupational History   Not on file  Tobacco Use   Smoking status: Not on file  Substance and Sexual Activity   Alcohol use: Not on file   Drug use: Not on file   Sexual activity: Not on file  Other Topics Concern   Not on file  Social History Narrative   Not on file   Social Determinants of Health   Financial Resource Strain:    Difficulty of Paying Living Expenses:   Food Insecurity:    Worried About Running Out of Food in the Last Year:    Ran Out of Food in the Last Year:   Transportation Needs:    Lack of Transportation (Medical):    Lack of Transportation (Non-Medical):   Physical Activity:  Days of Exercise per Week:    Minutes of Exercise per Session:   Stress:    Feeling of Stress :   Social Connections:    Frequency of Communication with Friends and Family:    Frequency of Social Gatherings with Friends and Family:    Attends Religious Services:    Active Member of Clubs or Organizations:    Attends Music therapist:    Marital Status:   Intimate Partner Violence:    Fear of Current or Ex-Partner:    Emotionally Abused:    Physically Abused:    Sexually Abused:      FAMILY  HISTORY:  No family history on file.    REVIEW OF SYSTEMS:  Review of Systems  Constitutional: Negative for chills and fever.  HENT: Negative for congestion and sore throat.   Respiratory: Negative for cough and shortness of breath.   Cardiovascular: Negative for chest pain and palpitations.  Gastrointestinal: Negative for abdominal pain, diarrhea, nausea and vomiting.  Genitourinary: Negative for dysuria and urgency.  Skin:       + Abdominal Wound  All other systems reviewed and are negative.   VITAL SIGNS:  Temp:  [97.7 F (36.5 C)] 97.7 F (36.5 C) (07/22 0241) Pulse Rate:  [76-78] 76 (07/22 0530) Resp:  [16] 16 (07/22 0530) BP: (142-188)/(75-87) 142/75 (07/22 0530) SpO2:  [94 %-99 %] 94 % (07/22 0530) Weight:  [65.9 kg] 65.9 kg (07/22 0500)     Height: 5' (152.4 cm) Weight: 65.9 kg BMI (Calculated): 28.37   INTAKE/OUTPUT:  07/21 0701 - 07/22 0700 In: 291 [IV Piggyback:291] Out: -   PHYSICAL EXAM:  Physical Exam Vitals and nursing note reviewed. Exam conducted with a chaperone present.  Constitutional:      General: She is not in acute distress.    Appearance: Normal appearance. She is obese. She is not ill-appearing.  HENT:     Head: Normocephalic and atraumatic.  Eyes:     General: No scleral icterus.    Conjunctiva/sclera: Conjunctivae normal.  Cardiovascular:     Rate and Rhythm: Normal rate and regular rhythm.     Pulses: Normal pulses.     Heart sounds: No murmur heard.   Pulmonary:     Effort: Pulmonary effort is normal. No respiratory distress.     Breath sounds: Normal breath sounds.  Abdominal:       Comments: Incision and Drainage site to the right mid-abdomen, there is marked surrounding blanchable erythema which is outline, there is surrounding induration, I do not appreciate any areas of gross crepitus or fluctuance. The tissue in the wound bed does appear somewhat compromised but no gross necrosis appreciable.   Genitourinary:    Comments:  Deferred Musculoskeletal:     Right lower leg: No edema.     Left lower leg: No edema.  Skin:    General: Skin is warm and dry.     Coloration: Skin is not pale.     Findings: Erythema (Right mid abdomen extending medially) present.  Neurological:     General: No focal deficit present.     Mental Status: She is alert and oriented to person, place, and time.  Psychiatric:        Mood and Affect: Mood normal.        Behavior: Behavior normal.      Labs:  CBC Latest Ref Rng & Units 11/12/2019 11/12/2019  WBC 4.0 - 10.5 K/uL 7.4 8.9  Hemoglobin 12.0 - 15.0 g/dL  12.0 13.6  Hematocrit 36 - 46 % 36.8 39.5  Platelets 150 - 400 K/uL 297 321   CMP Latest Ref Rng & Units 11/12/2019 11/12/2019  Glucose 70 - 99 mg/dL 113(H) 107(H)  BUN 8 - 23 mg/dL 12 13  Creatinine 0.44 - 1.00 mg/dL 0.84 0.97  Sodium 135 - 145 mmol/L 139 137  Potassium 3.5 - 5.1 mmol/L 4.2 3.8  Chloride 98 - 111 mmol/L 104 101  CO2 22 - 32 mmol/L 28 28  Calcium 8.9 - 10.3 mg/dL 9.3 9.9  Total Protein 6.5 - 8.1 g/dL - 8.2(H)  Total Bilirubin 0.3 - 1.2 mg/dL - 0.6  Alkaline Phos 38 - 126 U/L - 109  AST 15 - 41 U/L - 18  ALT 0 - 44 U/L - 21     Imaging studies:  No new pertinent imaging studies   Assessment/Plan: (ICD-10's: L03.311) 64 y.o. female with right abdominal wall cellulitis s/p I&D at urgent care on 07/19 who has failed outpatient ABx and continues to have progressively worsening erythema and pain, complicated by pertinent comorbidities including T2DM.   - I will order a CT Abdomen/Pelvis with IV contrast to better evaluate this wound for more severe infection  - I will tentatively make her NPO pending CT results  - She may require more formal debridement in the OR pending clinical condition and CT result  - Continue broad spectrum ABx (Vancomycin/Zosyn); follow up BCx  - Continue to monitor for leukocytosis / fever curve  - Hold DVT prophylaxis for now   - Further management per primary service; we will  follow  All of the above findings and recommendations were discussed with the patient and her family, and all of their questions were answered to their expressed satisfaction.  Thank you for the opportunity to participate in this patient's care.   -- Edison Simon, PA-C [facility] 11/12/2019, 7:33 AM [phone] M-F: 7am - 4pm   I saw and evaluated the patient.  I agree with the above documentation, exam, and plan, which I have edited where appropriate. Fredirick Maudlin  12:39 PM

## 2019-11-13 DIAGNOSIS — E1140 Type 2 diabetes mellitus with diabetic neuropathy, unspecified: Secondary | ICD-10-CM

## 2019-11-13 LAB — BASIC METABOLIC PANEL
Anion gap: 13 (ref 5–15)
BUN: 14 mg/dL (ref 8–23)
CO2: 25 mmol/L (ref 22–32)
Calcium: 9.7 mg/dL (ref 8.9–10.3)
Chloride: 103 mmol/L (ref 98–111)
Creatinine, Ser: 0.96 mg/dL (ref 0.44–1.00)
GFR calc Af Amer: >60 mL/min (ref >60)
GFR calc non Af Amer: >60 mL/min (ref >60)
Glucose, Bld: 206 mg/dL — ABNORMAL HIGH (ref 70–99)
Potassium: 4.2 mmol/L (ref 3.5–5.1)
Sodium: 141 mmol/L (ref 135–145)

## 2019-11-13 LAB — GLUCOSE, CAPILLARY
Glucose-Capillary: 149 mg/dL — ABNORMAL HIGH (ref 70–99)
Glucose-Capillary: 116 mg/dL — ABNORMAL HIGH (ref 70–99)
Glucose-Capillary: 116 mg/dL — ABNORMAL HIGH (ref 70–99)
Glucose-Capillary: 169 mg/dL — ABNORMAL HIGH (ref 70–99)

## 2019-11-13 LAB — CBC
HCT: 37.9 % (ref 36.0–46.0)
Hemoglobin: 12.3 g/dL (ref 12.0–15.0)
MCH: 29.9 pg (ref 26.0–34.0)
MCHC: 32.5 g/dL (ref 30.0–36.0)
MCV: 92 fL (ref 80.0–100.0)
Platelets: 318 10*3/uL (ref 150–400)
RBC: 4.12 MIL/uL (ref 3.87–5.11)
RDW: 14.3 % (ref 11.5–15.5)
WBC: 6.8 10*3/uL (ref 4.0–10.5)
nRBC: 0 % (ref 0.0–0.2)

## 2019-11-13 MED ORDER — VENLAFAXINE HCL ER 75 MG PO CP24
150.0000 mg | ORAL_CAPSULE | Freq: Two times a day (BID) | ORAL | Status: DC
  Administered 2019-11-13 – 2019-11-14 (×2): 150 mg via ORAL
  Filled 2019-11-13 (×2): qty 2

## 2019-11-13 NOTE — Progress Notes (Addendum)
[facility] SURGICAL PROGRESS NOTE (cpt [phone])  Hospital Day(s): 1.   Interval History: Patient seen and examined, no acute events or new complaints overnight. Patient reports she feels somewhat better but she is still having soreness in her RLQ, denies fever, or chills. No leukocytosis, WBC is 6.8K and labs are otherwise reassuring aside from hyperglycemia consistent with history of T2DM. Wound cultures growing gram (+) cocci in pairs. Continues on Vancomycin and Zosyn  Review of Systems:  Constitutional: denies fever, chills  HEENT: denies cough or congestion  Respiratory: denies any shortness of breath  Cardiovascular: denies chest pain or palpitations  Gastrointestinal: denies abdominal pain, N/V, or diarrhea/and bowel function as per interval history Genitourinary: denies burning with urination or urinary frequency Musculoskeletal: denies pain, decreased motor or sensation Integumentary: + RLQ abdominal wall cellulitis   Vital signs in last 24 hours: [min-max] current  Temp:  [97.9 F (36.6 C)-98.9 F (37.2 C)] 98.9 F (37.2 C) (07/23 0424) Pulse Rate:  [66-76] 76 (07/23 0424) Resp:  [16] 16 (07/23 0424) BP: (109-139)/(52-72) 109/52 (07/23 0424) SpO2:  [91 %-93 %] 92 % (07/23 0424)     Height: 5' (152.4 cm) Weight: 65.9 kg BMI (Calculated): 28.37   Intake/Output last 2 shifts:  07/22 0701 - 07/23 0700 In: -  Out: 1400 [Urine:1400]   Physical Exam:  Constitutional: alert, cooperative and no distress  HENT: normocephalic without obvious abnormality  Eyes: PERRL, EOM's grossly intact and symmetric  Respiratory: breathing non-labored at rest  Cardiovascular: regular rate and sinus rhythm  Gastrointestinal: Soft, soreness over RLQ wound, and non-distended Musculoskeletal: no edema or wounds, motor and sensation grossly intact, NT  Integumentary: Previous I&D site to the RLQ is healing well, surrounding erythema is markedly improved this morning, still somewhat  tender as expected, but no fluctuance or crepitus. Multiple pustules under the left breast, no drainage, no fluctuance.    Labs:  CBC Latest Ref Rng & Units 11/13/2019 11/12/2019 11/12/2019  WBC 4.0 - 10.5 K/uL 6.8 7.4 8.9  Hemoglobin 12.0 - 15.0 g/dL 12.3 12.0 13.6  Hematocrit 36 - 46 % 37.9 36.8 39.5  Platelets 150 - 400 K/uL 318 297 321   CMP Latest Ref Rng & Units 11/13/2019 11/12/2019 11/12/2019  Glucose 70 - 99 mg/dL 206(H) 113(H) 107(H)  BUN 8 - 23 mg/dL 14 12 13   Creatinine 0.44 - 1.00 mg/dL 0.96 0.84 0.97  Sodium 135 - 145 mmol/L 141 139 137  Potassium 3.5 - 5.1 mmol/L 4.2 4.2 3.8  Chloride 98 - 111 mmol/L 103 104 101  CO2 22 - 32 mmol/L 25 28 28   Calcium 8.9 - 10.3 mg/dL 9.7 9.3 9.9  Total Protein 6.5 - 8.1 g/dL - - 8.2(H)  Total Bilirubin 0.3 - 1.2 mg/dL - - 0.6  Alkaline Phos 38 - 126 U/L - - 109  AST 15 - 41 U/L - - 18  ALT 0 - 44 U/L - - 21     Imaging studies: No new pertinent imaging studies   Assessment/Plan: (ICD-10's: ICD-10's: L03.311) 64 y.o. female with improving right abdominal wall cellulitis s/p I&D at urgent care on 07/19 who has failed outpatient ABx and continues to have progressively worsening erythema and pain, complicated by pertinent comorbidities including T2DM   - Recommend at least 48 hours broad spectrum IV ABx (Vancomycin/Zosyn); follow up Wound Cx; growing gram (+) cocci in pairs  - Continue local wound care; pack with iodoform gauze daily; cover and secure  - She should consider chlorhexidine  washes at home             - Continue to monitor for leukocytosis / fever curve   - No surgical intervention indicated  - Further management per primary service   All of the above findings and recommendations were discussed with the patient, and the medical team, and all of patient's questions were answered to her expressed satisfaction.   -- Edison Simon, PA-C [facility] 11/13/2019, 8:28 AM [phone] M-F: 7am - 4pm  I saw  and evaluated the patient.  I agree with the above documentation, exam, and plan, which I have edited where appropriate. Fredirick Maudlin  11:43 AM

## 2019-11-13 NOTE — Progress Notes (Signed)
Pharmacy Antibiotic Note  Mary Nicholson is a 64 y.o. female admitted on 11/12/2019 with abdominal wall cellulitis s/p I&D at urgent care on 07/19 who has failed outpatient ABx . Wound cultures growing gram (+) cocci in pairs (pending final results). Pharmacy was consulted for Vancomycin and Zosyn dosing. This is day # 2 of broad-spectrum IV antibiotics, renal function has been stable at her apparent baseline.  Plan:  1) Zosyn   Continue 3.375 grams IV every 8 hours (4 hour infusion).   2) vancomycin   Continue vancomycin 1000 mg IV every 24 hrs  T1/2: 17.9 h, Ke 0.039 h-1  Css (calculated): 34.7/14.2 mcg/mL  Daily SCr to assess renal function  Vancomycin level as clinically indicated  Height: 5' (152.4 cm) Weight: 65.9 kg (145 lb 4.5 oz) IBW/kg (Calculated) : 45.5  Temp (24hrs), Avg:98.2 F (36.8 C), Min:97.7 F (36.5 C), Max:98.9 F (37.2 C)  Recent Labs  Lab 11/12/19 0015 11/12/19 0441 11/13/19 0447  WBC 8.9 7.4 6.8  CREATININE 0.97 0.84 0.96  LATICACIDVEN 1.0  --   --     Estimated Creatinine Clearance: 48.9 mL/min (by C-G formula based on SCr of 0.96 mg/dL).    No Known Allergies  Antimicrobials this admission: 7/22 Zosyn  >>  7/22 vancomycin  >>   Microbiology results: 7/22 BCx: NG x 1 day 7/22 WCx: S aureus (pending final results)  7/22 SARS CoV-2: negative   7/22 MRSA PCR: negative   Thank you for allowing pharmacy to be a part of this patient's care.  Dallie Piles 11/13/2019 7:05 AM

## 2019-11-13 NOTE — Progress Notes (Addendum)
Patient ID: Mary Nicholson, female   DOB: [birth year], 64 y.o.   MRN: [id number] [facility] PROGRESS NOTE  Mary Nicholson BSW:[id number] DOB: 1955/11/06 DOA: 11/12/2019 PCP: Mary Pilar, MD  HPI/Subjective: The patient is feeling a little bit better.  Still having some discomfort in her right abdomen.  The pain medications have helped.  The redness has improved from yesterday.  The spots under her left breast and neck have also improved a little bit.  Objective: Vitals:   11/13/19 0424 11/13/19 1126  BP: (!) 109/52 (!) 149/77  Pulse: 76 61  Resp: 16 16  Temp: 98.9 F (37.2 C) 98.3 F (36.8 C)  SpO2: 92% 97%    Intake/Output Summary (Last 24 hours) at 11/13/2019 1330 Last data filed at 11/13/2019 0618 Gross per 24 hour  Intake --  Output 1400 ml  Net -1400 ml   Filed Weights   11/12/19 0500  Weight: 65.9 kg    ROS: Review of Systems  Respiratory: Negative for shortness of breath.   Cardiovascular: Negative for chest pain.  Gastrointestinal: Positive for abdominal pain.   Exam: Physical Exam HENT:     Head: Normocephalic.     Mouth/Throat:     Pharynx: No oropharyngeal exudate.  Eyes:     General: Lids are normal.     Conjunctiva/sclera: Conjunctivae normal.     Pupils: Pupils are equal, round, and reactive to light.  Cardiovascular:     Rate and Rhythm: Normal rate and regular rhythm.     Heart sounds: Normal heart sounds, S1 normal and S2 normal.  Pulmonary:     Breath sounds: No decreased breath sounds, wheezing, rhonchi or rales.  Abdominal:     Palpations: Abdomen is soft.     Tenderness: There is abdominal tenderness in the right lower quadrant.  Musculoskeletal:     Right ankle: No swelling.     Left ankle: No swelling.  Skin:    General: Skin is warm.     Comments: Right lower abdominal wound packed.  Still has some surrounding erythema but less than yesterday.  The erythema as a lot better than the initial marker to  outline. Small areas in the left breast and neck have improved since yesterday also.  Just slight erythema.  Neurological:     Mental Status: She is alert and oriented to person, place, and time.       Data Reviewed: Basic Metabolic Panel: Recent Labs  Lab 11/12/19 0015 11/12/19 0441 11/13/19 0447  NA 137 139 141  K 3.8 4.2 4.2  CL 101 104 103  CO2 28 28 25   GLUCOSE 107* 113* 206*  BUN 13 12 14   CREATININE 0.97 0.84 0.96  CALCIUM 9.9 9.3 9.7   Liver Function Tests: Recent Labs  Lab 11/12/19 0015  AST 18  ALT 21  ALKPHOS 109  BILITOT 0.6  PROT 8.2*  ALBUMIN 4.8   CBC: Recent Labs  Lab 11/12/19 0015 11/12/19 0441 11/13/19 0447  WBC 8.9 7.4 6.8  NEUTROABS 4.4  --   --   HGB 13.6 12.0 12.3  HCT 39.5 36.8 37.9  MCV 88.2 91.1 92.0  PLT 321 297 318    CBG: Recent Labs  Lab 11/12/19 1221 11/12/19 1730 11/12/19 2113 11/13/19 0737 11/13/19 1123  GLUCAP 178* 91 154* 149* 116*    Recent Results (from the past 240 hour(s))  Blood culture (routine x 2)     Status: None (Preliminary result)   Collection Time: 11/12/19 12:15 AM  Specimen: Right Antecubital; Blood  Result Value Ref Range Status   Specimen Description RIGHT ANTECUBITAL  Final   Special Requests   Final    BOTTLES DRAWN AEROBIC ONLY Blood Culture adequate volume   Culture   Final    NO GROWTH 1 DAY Performed at [facility], [address] [place], [place] [address]    Report Status PENDING  Incomplete  SARS Coronavirus 2 by RT PCR (hospital order, performed in [facility] hospital lab) Nasopharyngeal Nasopharyngeal Swab     Status: None   Collection Time: 11/12/19  4:41 AM   Specimen: Nasopharyngeal Swab  Result Value Ref Range Status   SARS Coronavirus 2 NEGATIVE NEGATIVE Final    Comment: (NOTE) SARS-CoV-2 target nucleic acids are NOT DETECTED.  The SARS-CoV-2 RNA is generally detectable in upper and lower respiratory specimens during the acute phase of infection. The  lowest concentration of SARS-CoV-2 viral copies this assay can detect is 250 copies / mL. A negative result does not preclude SARS-CoV-2 infection and should not be used as the sole basis for treatment or other patient management decisions.  A negative result may occur with improper specimen collection / handling, submission of specimen other than nasopharyngeal swab, presence of viral mutation(s) within the areas targeted by this assay, and inadequate number of viral copies (<250 copies / mL). A negative result must be combined with clinical observations, patient history, and epidemiological information.  Fact Sheet for Patients:   StrictlyIdeas.no  Fact Sheet for Healthcare Providers: BankingDealers.co.za  This test is not yet approved or  cleared by the [place] FDA and has been authorized for detection and/or diagnosis of SARS-CoV-2 by FDA under an Emergency Use Authorization (EUA).  This EUA will remain in effect (meaning this test can be used) for the duration of the COVID-19 declaration under Section 564(b)(1) of the Act, 21 U.S.C. section 360bbb-3(b)(1), unless the authorization is terminated or revoked sooner.  Performed at [facility], [address] [place], [place] [address]   Aerobic/Anaerobic Culture (surgical/deep wound)     Status: None (Preliminary result)   Collection Time: 11/12/19  9:36 AM   Specimen: Abdomen; Wound  Result Value Ref Range Status   Specimen Description   Final    ABDOMEN Performed at [facility], [address] [place], [place] [address]    Special Requests   Final    NONE Performed at [facility], [place], [place] [address]    Gram Stain NO WBC SEEN RARE GRAM POSITIVE COCCI IN PAIRS   Final   Culture   Final    ABUNDANT STAPHYLOCOCCUS AUREUS SUSCEPTIBILITIES TO FOLLOW Performed at [facility], [address], [place],  [place] [address]    Report Status PENDING  Incomplete  MRSA PCR Screening     Status: None   Collection Time: 11/12/19 12:40 PM   Specimen: Nasal Mucosa; Nasopharyngeal  Result Value Ref Range Status   MRSA by PCR NEGATIVE NEGATIVE Final    Comment:        The GeneXpert MRSA Assay (FDA approved for NASAL specimens only), is one component of a comprehensive MRSA colonization surveillance program. It is not intended to diagnose MRSA infection nor to guide or monitor treatment for MRSA infections. Performed at [facility], [address], [place], [place] [address]      Studies: CT ABDOMEN PELVIS W CONTRAST  Result Date: 11/12/2019 CLINICAL DATA:  Abdominal pain. Wound infection EXAM: CT  ABDOMEN AND PELVIS WITH CONTRAST TECHNIQUE: Multidetector CT imaging of the abdomen and pelvis was performed using the standard protocol following bolus administration of intravenous contrast. CONTRAST:  168mL OMNIPAQUE IOHEXOL 300 MG/ML  SOLN COMPARISON:  None. FINDINGS: Lower chest: The lung bases are clear of an acute process. No pleural effusions or pulmonary lesions. The heart is normal in size. No pericardial effusion. Hepatobiliary: No hepatic lesions are identified. The gallbladder is surgically absent. No intra or extrahepatic biliary dilatation. Pancreas: No mass, inflammation or ductal dilatation. Spleen: Normal size. No focal lesions. Adrenals/Urinary Tract: Adrenal glands and kidneys are unremarkable. The bladder is unremarkable. Stomach/Bowel: The stomach, duodenum, small bowel and colon are unremarkable. No acute inflammatory changes, mass lesions or obstructive findings. The terminal ileum and appendix are normal. Moderate colonic diverticulosis but no findings for acute diverticulitis. Vascular/Lymphatic: Age advanced atherosclerotic calcifications involving the aorta and iliac arteries. No aneurysm. The branch vessels are patent. The major venous structures are patent. No mesenteric or  retroperitoneal mass or adenopathy. Reproductive: Surgically absent. Other: No pelvic mass or adenopathy. No free pelvic fluid collections. No inguinal mass or adenopathy. No abdominal wall hernia or subcutaneous lesions. There is an open wound noted involving the lower right abdominal wall at the level of the mid iliac crest. Associated surrounding skin thickening and underlying cellulitis. No discrete rim enhancing fluid collection to suggest a drainable abscess. I do not see any obvious changes of myofasciitis involving the underlying oblique abdominal muscles. Musculoskeletal: No significant bony findings. 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 IMPRESSION: 1. Open wound involving the lower right abdominal wall at the level of the mid iliac crest with surrounding skin thickening and underlying cellulitis. No discrete rim enhancing fluid collection to suggest a drainable abscess. 2. No acute abdominal/pelvic findings, mass lesions or adenopathy. 3. Status post cholecystectomy. No biliary dilatation. 4. Age advanced atherosclerotic calcifications involving the aorta and iliac arteries. Aortic Atherosclerosis (ICD10-I70.0). Electronically Signed   By: Marijo Sanes M.D.   On: 11/12/2019 09:18    Scheduled Meds: . atorvastatin  80 mg Oral Daily  . busPIRone  15 mg Oral TID  . enoxaparin (LOVENOX) injection  40 mg Subcutaneous Q24H  . famotidine  40 mg Oral Daily  . fluticasone  2 spray Each Nare Daily  . gabapentin  800 mg Oral TID  . glipiZIDE  5 mg Oral Q breakfast  . insulin aspart  0-15 Units Subcutaneous TID PC & HS  . lamoTRIgine  200 mg Oral BID  . levothyroxine  100 mcg Oral Daily  . metoprolol succinate  50 mg Oral Daily  . traZODone  100 mg Oral QHS  . varenicline  1 mg Oral BID  . venlafaxine XR  150 mg Oral BID   Continuous Infusions: . sodium chloride 50 mL/hr at 11/12/19 1300  . piperacillin-tazobactam (ZOSYN)  IV 3.375 g (11/13/19 0549)   . vancomycin 1,250 mg (11/12/19 2021)    Assessment/Plan:  1. Abdominal wall abscess and cellulitis.  Patient had an incision and drainage as outpatient at the acute care.  Failed outpatient treatment.  Continue IV antibiotics today.  Hopefully things will continue to improve tomorrow and be ready for disposition.  Patient on vancomycin and Zosyn.  Continue to follow wound culture.  Will prescribe chlorhexidine rinse upon disposition also 2. 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  Continue Glucotrol and gabapentin.  Hemoglobin A1c elevated 8.4. 3. Hyperlipidemia unspecified on atorvastatin.   4.  Hypothyroidism unspecified on levothyroxine.  5. Bipolar disorder.  Continue psychiatric medication.     Code Status:     Code Status Orders  (From admission, onward)         Start     Ordered   11/12/19 0331  Full code  Continuous        11/12/19 0333        Code Status History    This patient has a current code status but no historical code status.   Advance Care Planning Activity     Family Communication: Left message for daughter. Disposition Plan: Status is: Inpatient  Dispo: The patient is from: Home              Anticipated d/c is to: Home              Anticipated d/c date is: Potential disposition 11/14/2019              Patient currently receiving IV antibiotics for abscess and cellulitis.  Consultants:  General surgery  Antibiotics:  Vancomycin  Zosyn  Time spent: 26 minutes, case discussed with general surgery  [place]

## 2019-11-14 LAB — GLUCOSE, CAPILLARY: Glucose-Capillary: 133 mg/dL — ABNORMAL HIGH (ref 70–99)

## 2019-11-14 LAB — CREATININE, SERUM
Creatinine, Ser: 0.93 mg/dL (ref 0.44–1.00)
GFR calc Af Amer: >60 mL/min (ref >60)
GFR calc non Af Amer: >60 mL/min (ref >60)

## 2019-11-14 MED ORDER — OXYCODONE HCL 5 MG PO TABS: 5.0000 mg | ORAL_TABLET | Freq: Four times a day (QID) | ORAL | 0 refills | Status: DC | PRN

## 2019-11-14 MED ORDER — CHLORHEXIDINE GLUCONATE 4 % EX LIQD: Freq: Every day | CUTANEOUS | 0 refills | Status: DC | PRN

## 2019-11-14 MED ORDER — DOXYCYCLINE HYCLATE 100 MG PO TABS
100.0000 mg | ORAL_TABLET | Freq: Two times a day (BID) | ORAL | Status: DC
  Administered 2019-11-14: 100 mg via ORAL
  Filled 2019-11-14: qty 1

## 2019-11-14 MED ORDER — DOXYCYCLINE HYCLATE 100 MG PO TABS: 100.0000 mg | ORAL_TABLET | Freq: Two times a day (BID) | ORAL | 0 refills | Status: AC

## 2019-11-14 NOTE — Discharge Instructions (Signed)
Pack open wound daily with xeroform and cover with gauze  Skin Abscess  A skin abscess is an infected area of your skin that contains pus and other material. An abscess can happen in any part of your body. Some abscesses break open (rupture) on their own. Most continue to get worse unless they are treated. The infection can spread deeper into the body and into your blood, which can make you feel sick. A skin abscess is caused by germs that enter the skin through a cut or scrape. It can also be caused by blocked oil and sweat glands or infected hair follicles. This condition is usually treated by:  Draining the pus.  Taking antibiotic medicines.  Placing a warm, wet washcloth over the abscess. Follow these instructions at home: Medicines   Take over-the-counter and prescription medicines only as told by your doctor.  If you were prescribed an antibiotic medicine, take it as told by your doctor. Do not stop taking the antibiotic even if you start to feel better. Abscess care   If you have an abscess that has not drained, place a warm, clean, wet washcloth over the abscess several times a day. Do this as told by your doctor.  Follow instructions from your doctor about how to take care of your abscess. Make sure you: ? Cover the abscess with a bandage (dressing). ? Change your bandage or gauze as told by your doctor. ? Wash your hands with soap and water before you change the bandage or gauze. If you cannot use soap and water, use hand sanitizer.  Check your abscess every day for signs that the infection is getting worse. Check for: ? More redness, swelling, or pain. ? More fluid or blood. ? Warmth. ? More pus or a bad smell. General instructions  To avoid spreading the infection: ? Do not share personal care items, towels, or hot tubs with others. ? Avoid making skin-to-skin contact with other people.  Keep all follow-up visits as told by your doctor. This is important. Contact  a doctor if:  You have more redness, swelling, or pain around your abscess.  You have more fluid or blood coming from your abscess.  Your abscess feels warm when you touch it.  You have more pus or a bad smell coming from your abscess.  You have a fever.  Your muscles ache.  You have chills.  You feel sick. Get help right away if:  You have very bad (severe) pain.  You see red streaks on your skin spreading away from the abscess. Summary  A skin abscess is an infected area of your skin that contains pus and other material.  The abscess is caused by germs that enter the skin through a cut or scrape. It can also be caused by blocked oil and sweat glands or infected hair follicles.  Follow your doctor's instructions on caring for your abscess, taking medicines, preventing infections, and keeping follow-up visits. This information is not intended to replace advice given to you by your health care provider. Make sure you discuss any questions you have with your health care provider. Document Revised: 11/13/2018 Document Reviewed: 05/23/2017 Elsevier Patient Education  2020 [employer].

## 2019-11-14 NOTE — Progress Notes (Signed)
[place] Lucianne Lei Nostrand  A and O x 4. VSS. Pt tolerating diet well. No complaints of pain or nausea. IV removed intact, prescriptions given. Pt voiced understanding of discharge instructions with no further questions. Pt discharged via wheelchair with RN.   Allergies as of 11/14/2019   No Known Allergies     Medication List    STOP taking these medications   hydrochlorothiazide 25 MG tablet Commonly known as: HYDRODIURIL   sulfamethoxazole-trimethoprim 800-160 MG tablet Commonly known as: BACTRIM DS     TAKE these medications   albuterol 108 (90 Base) MCG/ACT inhaler Commonly known as: VENTOLIN HFA Inhale into the lungs every 6 (six) hours as needed for wheezing or shortness of breath.   aspirin 81 MG chewable tablet Chew by mouth.   atorvastatin 80 MG tablet Commonly known as: LIPITOR Take 80 mg by mouth daily.   busPIRone 15 MG tablet Commonly known as: BUSPAR Take 15 mg by mouth 3 (three) times daily.   Chantix 1 MG tablet Generic drug: varenicline Take 1 mg by mouth 2 (two) times daily.   chlorhexidine 4 % external liquid Commonly known as: HIBICLENS Apply topically daily as needed.   cyanocobalamin 1000 MCG/ML injection Commonly known as: (VITAMIN B-12) Inject into the muscle.   doxycycline 100 MG tablet Commonly known as: VIBRA-TABS Take 1 tablet (100 mg total) by mouth every 12 (twelve) hours for 8 days.   famotidine 40 MG tablet Commonly known as: PEPCID Take 40 mg by mouth daily.   fluticasone 50 MCG/ACT nasal spray Commonly known as: FLONASE Place 2 sprays into both nostrils daily.   gabapentin 800 MG tablet Commonly known as: NEURONTIN Take 800 mg by mouth 3 (three) times daily.   glipiZIDE 5 MG 24 hr tablet Commonly known as: GLUCOTROL XL Take 5 mg by mouth daily.   lamoTRIgine 200 MG tablet Commonly known as: LAMICTAL Take 200 mg by mouth 2 (two) times daily.   levothyroxine 100 MCG tablet Commonly known as: SYNTHROID Take 100 mcg by  mouth daily.   meloxicam 15 MG tablet Commonly known as: MOBIC Take 15 mg by mouth daily.   metFORMIN 500 MG 24 hr tablet Commonly known as: GLUCOPHAGE-XR Take 1,000 mg by mouth 2 (two) times daily.   metoprolol succinate 50 MG 24 hr tablet Commonly known as: TOPROL-XL Take 50 mg by mouth daily.   oxyCODONE 5 MG immediate release tablet Commonly known as: Oxy IR/ROXICODONE Take 1 tablet (5 mg total) by mouth every 6 (six) hours as needed for severe pain.   traZODone 100 MG tablet Commonly known as: DESYREL Take 100 mg by mouth at bedtime as needed.   venlafaxine XR 75 MG 24 hr capsule Commonly known as: EFFEXOR-XR Take 150 mg by mouth in the morning and at bedtime.       Vitals:   11/13/19 2122 11/14/19 0853  BP: (!) 132/73 (!) 122/55  Pulse: 67 70  Resp: 20 16  Temp: 98 F (36.7 C) 98 F (36.7 C)  SpO2: 95% 95%    Francesco Sor

## 2019-11-14 NOTE — Progress Notes (Signed)
[place] Hospital Day(s): 2.   Post op day(s):  Marland Kitchen   Interval History: Patient seen and examined, no acute events or new complaints overnight. Patient reports feeling great today.  She reported that the pain has significantly resolved.  She says that she feels 100% better than when she came here.  She feels more comfortable.  The patient reports minimal pain around the wound area.  There is no pain radiation.  There is no alleviating or aggravating factor.  Denies any fever or chills.  Vital signs in last 24 hours: [min-max] current  Temp:  [98 F (36.7 C)-98.3 F (36.8 C)] 98 F (36.7 C) (07/24 0853) Pulse Rate:  [61-70] 70 (07/24 0853) Resp:  [16-20] 16 (07/24 0853) BP: (122-149)/(55-77) 122/55 (07/24 0853) SpO2:  [95 %-97 %] 95 % (07/24 0853)     Height: 5' (152.4 cm) Weight: 65.9 kg BMI (Calculated): 28.37   Physical Exam:  Constitutional: alert, cooperative and no distress  Respiratory: breathing non-labored at rest  Cardiovascular: regular rate and sinus rhythm  Gastrointestinal: soft, non-tender, and non-distended. Minimal redness around the open wound without purulent discharge.  Iodoform packing in place changing this morning.  Labs:  CBC Latest Ref Rng & Units 11/13/2019 11/12/2019 11/12/2019  WBC 4.0 - 10.5 K/uL 6.8 7.4 8.9  Hemoglobin 12.0 - 15.0 g/dL 12.3 12.0 13.6  Hematocrit 36 - 46 % 37.9 36.8 39.5  Platelets 150 - 400 K/uL 318 297 321   CMP Latest Ref Rng & Units 11/14/2019 11/13/2019 11/12/2019  Glucose 70 - 99 mg/dL - 206(H) 113(H)  BUN 8 - 23 mg/dL - 14 12  Creatinine 0.44 - 1.00 mg/dL 0.93 0.96 0.84  Sodium 135 - 145 mmol/L - 141 139  Potassium 3.5 - 5.1 mmol/L - 4.2 4.2  Chloride 98 - 111 mmol/L - 103 104  CO2 22 - 32 mmol/L - 25 28  Calcium 8.9 - 10.3 mg/dL - 9.7 9.3  Total Protein 6.5 - 8.1 g/dL - - -  Total Bilirubin 0.3 - 1.2 mg/dL - - -  Alkaline Phos 38 - 126 U/L - - -  AST 15 - 41 U/L - - -  ALT 0 - 44 U/L - - -    Imaging  studies: No new pertinent imaging studies   Assessment/Plan:  64 y.o. female with abdominal wall abscess and cellulitis s/p incision and drainage, complicated by pertinent comorbidities including diabetes, hypertension, dyslipidemia.  Patient responding adequately to IV antibiotic therapy.  The cellulitis is almost resolved.  There is no purulent drainage and no fluctuance area for need of more incision and drainage.  I agree the patient can be transition to oral antibiotic therapy and discharged home.  Patient will be follow at the surgery clinic by Dr. Celine Ahr as outpatient for reassurance that the patient continue healing adequately.  Arnold Long, MD

## 2019-11-14 NOTE — Discharge Summary (Signed)
[facility] [place] NAME: Mary Nicholson    MR#:  [id number]  DATE OF BIRTH:  Mar 08, 1956  DATE OF ADMISSION:  11/12/2019 ADMITTING PHYSICIAN: Christel Mormon, MD  DATE OF DISCHARGE: 11/14/2019 12:40 PM  PRIMARY CARE PHYSICIAN: Hortencia Pilar, MD    ADMISSION DIAGNOSIS:  Abdominal wall abscess [L02.211] Abdominal wall cellulitis [U63.335]  DISCHARGE DIAGNOSIS:  Active Problems:   Abdominal wall cellulitis   Type 2 diabetes mellitus with diabetic neuropathy, without long-term current use of insulin ([place])   SECONDARY DIAGNOSIS:   Past Medical History:  Diagnosis Date  . Cancer [facility])    cervical    HOSPITAL COURSE:   1.  Abdominal wall abscess and cellulitis.  Patient had an incision and drainage as outpatient at the acute care center.  Failed outpatient treatment with Bactrim.  Cellulitis had worsened on the abdominal wall.  The patient was initially started on vancomycin and Zosyn.  Culture sent off growing staph aureus which is sensitive.  Patient was switched over to doxycycline.  The patient did have some other areas of small pustules one on her neck and quite a few under her left breast.  Hibiclens chlorhexidine solution prescribed.  Small amount of pain medication prescribed.  Xeroform packing for wounds on a daily basis until wound closed.  This will close from the inside out.  Family okay doing dressing changes.  Keep wound covered while open. 2.  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  Continue Glucotrol, Metformin and gabapentin.  Hemoglobin A1c elevated 8.4.  Consider third agent as outpatient. 3.  Hyperlipidemia unspecified on atorvastatin 4.  Hypothyroidism unspecified on levothyroxine 5.  Bipolar disorder.  Continue psychiatric medication  DISCHARGE CONDITIONS:  Satisfactory  CONSULTS OBTAINED:  Treatment Team:  Fredirick Maudlin, MD  DRUG ALLERGIES:  No Known Allergies  DISCHARGE  MEDICATIONS:   Allergies as of 11/14/2019   No Known Allergies     Medication List    STOP taking these medications   hydrochlorothiazide 25 MG tablet Commonly known as: HYDRODIURIL   sulfamethoxazole-trimethoprim 800-160 MG tablet Commonly known as: BACTRIM DS     TAKE these medications   albuterol 108 (90 Base) MCG/ACT inhaler Commonly known as: VENTOLIN HFA Inhale into the lungs every 6 (six) hours as needed for wheezing or shortness of breath.   aspirin 81 MG chewable tablet Chew by mouth.   atorvastatin 80 MG tablet Commonly known as: LIPITOR Take 80 mg by mouth daily.   busPIRone 15 MG tablet Commonly known as: BUSPAR Take 15 mg by mouth 3 (three) times daily.   Chantix 1 MG tablet Generic drug: varenicline Take 1 mg by mouth 2 (two) times daily.   chlorhexidine 4 % external liquid Commonly known as: HIBICLENS Apply topically daily as needed.   cyanocobalamin 1000 MCG/ML injection Commonly known as: (VITAMIN B-12) Inject into the muscle.   doxycycline 100 MG tablet Commonly known as: VIBRA-TABS Take 1 tablet (100 mg total) by mouth every 12 (twelve) hours for 8 days.   famotidine 40 MG tablet Commonly known as: PEPCID Take 40 mg by mouth daily.   fluticasone 50 MCG/ACT nasal spray Commonly known as: FLONASE Place 2 sprays into both nostrils daily.   gabapentin 800 MG tablet Commonly known as: NEURONTIN Take 800 mg by mouth 3 (three) times daily.   glipiZIDE 5 MG 24 hr tablet Commonly known as: GLUCOTROL XL Take 5 mg by mouth daily.   lamoTRIgine 200 MG  tablet Commonly known as: LAMICTAL Take 200 mg by mouth 2 (two) times daily.   levothyroxine 100 MCG tablet Commonly known as: SYNTHROID Take 100 mcg by mouth daily.   meloxicam 15 MG tablet Commonly known as: MOBIC Take 15 mg by mouth daily.   metFORMIN 500 MG 24 hr tablet Commonly known as: GLUCOPHAGE-XR Take 1,000 mg by mouth 2 (two) times daily.   metoprolol succinate 50 MG 24 hr  tablet Commonly known as: TOPROL-XL Take 50 mg by mouth daily.   oxyCODONE 5 MG immediate release tablet Commonly known as: Oxy IR/ROXICODONE Take 1 tablet (5 mg total) by mouth every 6 (six) hours as needed for severe pain.   traZODone 100 MG tablet Commonly known as: DESYREL Take 100 mg by mouth at bedtime as needed.   venlafaxine XR 75 MG 24 hr capsule Commonly known as: EFFEXOR-XR Take 150 mg by mouth in the morning and at bedtime.        DISCHARGE INSTRUCTIONS:   Follow-up PMD 5 days Follow-up Dr. Celine Ahr general surgery as outpatient  If you experience worsening of your admission symptoms, develop shortness of breath, life threatening emergency, suicidal or homicidal thoughts you must seek medical attention immediately by calling 911 or calling your MD immediately  if symptoms less severe.  You Must read complete instructions/literature along with all the possible adverse reactions/side effects for all the Medicines you take and that have been prescribed to you. Take any new Medicines after you have completely understood and accept all the possible adverse reactions/side effects.   Please note  You were cared for by a hospitalist during your hospital stay. If you have any questions about your discharge medications or the care you received while you were in the hospital after you are discharged, you can call the unit and asked to speak with the hospitalist on call if the hospitalist that took care of you is not available. Once you are discharged, your primary care physician will handle any further medical issues. Please note that NO REFILLS for any discharge medications will be authorized once you are discharged, as it is imperative that you return to your primary care physician (or establish a relationship with a primary care physician if you do not have one) for your aftercare needs so that they can reassess your need for medications and monitor your lab values.    Today    CHIEF COMPLAINT:   Chief Complaint  Patient presents with  . Wound Infection    HISTORY OF PRESENT ILLNESS:  Mary Nicholson  is a 64 y.o. female came in with wound infection.   VITAL SIGNS:  Blood pressure (!) 122/55, pulse 70, temperature 98 F (36.7 C), temperature source Oral, resp. rate 16, height 5' (1.524 m), weight 65.9 kg, SpO2 95 %.  I/O:    Intake/Output Summary (Last 24 hours) at 11/14/2019 1410 Last data filed at 11/14/2019 1043 Gross per 24 hour  Intake 1318.3 ml  Output 700 ml  Net 618.3 ml    PHYSICAL EXAMINATION:  GENERAL:  64 y.o.-year-old patient lying in the bed with no acute distress.  EYES: Pupils equal, round, reactive to light and accommodation. No scleral icterus.  HEENT: Head atraumatic, normocephalic. Oropharynx and nasopharynx clear.  LUNGS: Normal breath sounds bilaterally, no wheezing, rales,rhonchi or crepitation. No use of accessory muscles of respiration.  CARDIOVASCULAR: S1, S2 normal. No murmurs, rubs, or gallops.  ABDOMEN: Soft, right abdomen tender.  EXTREMITIES: No pedal edema.  NEUROLOGIC: Cranial nerves II  through XII are intact. Muscle strength 5/5 in all extremities. Sensation intact. Gait not checked.  PSYCHIATRIC: The patient is alert and oriented x 3.  SKIN: No obvious rash, lesion, or ulcer.   DATA REVIEW:   CBC Recent Labs  Lab 11/13/19 0447  WBC 6.8  HGB 12.3  HCT 37.9  PLT 318    Chemistries  Recent Labs  Lab 11/12/19 0015 11/12/19 0441 11/13/19 0447 11/13/19 0447 11/14/19 0603  NA 137   < > 141  --   --   K 3.8   < > 4.2  --   --   CL 101   < > 103  --   --   CO2 28   < > 25  --   --   GLUCOSE 107*   < > 206*  --   --   BUN 13   < > 14  --   --   CREATININE 0.97   < > 0.96   < > 0.93  CALCIUM 9.9   < > 9.7  --   --   AST 18  --   --   --   --   ALT 21  --   --   --   --   ALKPHOS 109  --   --   --   --   BILITOT 0.6  --   --   --   --    < > = values in this interval not displayed.      Microbiology Results  Results for orders placed or performed during the hospital encounter of 11/12/19  Blood culture (routine x 2)     Status: None (Preliminary result)   Collection Time: 11/12/19 12:15 AM   Specimen: Right Antecubital; Blood  Result Value Ref Range Status   Specimen Description RIGHT ANTECUBITAL  Final   Special Requests   Final    BOTTLES DRAWN AEROBIC ONLY Blood Culture adequate volume   Culture   Final    NO GROWTH 2 DAYS Performed at [facility], [address] [place], [place] [address]    Report Status PENDING  Incomplete  SARS Coronavirus 2 by RT PCR (hospital order, performed in [place] hospital lab) Nasopharyngeal Nasopharyngeal Swab     Status: None   Collection Time: 11/12/19  4:41 AM   Specimen: Nasopharyngeal Swab  Result Value Ref Range Status   SARS Coronavirus 2 NEGATIVE NEGATIVE Final    Comment: (NOTE) SARS-CoV-2 target nucleic acids are NOT DETECTED.  The SARS-CoV-2 RNA is generally detectable in upper and lower respiratory specimens during the acute phase of infection. The lowest concentration of SARS-CoV-2 viral copies this assay can detect is 250 copies / mL. A negative result does not preclude SARS-CoV-2 infection and should not be used as the sole basis for treatment or other patient management decisions.  A negative result may occur with improper specimen collection / handling, submission of specimen other than nasopharyngeal swab, presence of viral mutation(s) within the areas targeted by this assay, and inadequate number of viral copies (<250 copies / mL). A negative result must be combined with clinical observations, patient history, and epidemiological information.  Fact Sheet for Patients:   StrictlyIdeas.no  Fact Sheet for Healthcare Providers: BankingDealers.co.za  This test is not yet approved or  cleared by the [place] FDA and has been authorized  for detection and/or diagnosis of SARS-CoV-2 by FDA under an Emergency Use Authorization (EUA).  This EUA will  remain in effect (meaning this test can be used) for the duration of the COVID-19 declaration under Section 564(b)(1) of the Act, 21 U.S.C. section 360bbb-3(b)(1), unless the authorization is terminated or revoked sooner.  Performed at [facility], [address] [place], [place] [address]   Aerobic/Anaerobic Culture (surgical/deep wound)     Status: None (Preliminary result)   Collection Time: 11/12/19  9:36 AM   Specimen: Abdomen; Wound  Result Value Ref Range Status   Specimen Description   Final    ABDOMEN Performed at [facility], [address] [place], [place] [address]    Special Requests   Final    NONE Performed at [facility], [address] [place], [place] [address]    Gram Stain   Final    NO WBC SEEN RARE GRAM POSITIVE COCCI IN PAIRS Performed at [facility], [place] [address], [place], [place] [address]    Culture   Final    ABUNDANT STAPHYLOCOCCUS AUREUS NO ANAEROBES ISOLATED; CULTURE IN PROGRESS FOR 5 DAYS    Report Status PENDING  Incomplete   Organism ID, Bacteria STAPHYLOCOCCUS AUREUS  Final      Susceptibility   Staphylococcus aureus - MIC*    CIPROFLOXACIN >=8 RESISTANT Resistant     ERYTHROMYCIN >=8 RESISTANT Resistant     GENTAMICIN <=0.5 SENSITIVE Sensitive     OXACILLIN 0.5 SENSITIVE Sensitive     TETRACYCLINE <=1 SENSITIVE Sensitive     VANCOMYCIN <=0.5 SENSITIVE Sensitive     TRIMETH/SULFA <=10 SENSITIVE Sensitive     CLINDAMYCIN <=0.25 SENSITIVE Sensitive     RIFAMPIN <=0.5 SENSITIVE Sensitive     Inducible Clindamycin NEGATIVE Sensitive     * ABUNDANT STAPHYLOCOCCUS AUREUS  MRSA PCR Screening     Status: None   Collection Time: 11/12/19 12:40 PM   Specimen: Nasal Mucosa; Nasopharyngeal  Result Value Ref Range Status   MRSA by PCR NEGATIVE NEGATIVE Final    Comment:        The GeneXpert  MRSA Assay (FDA approved for NASAL specimens only), is one component of a comprehensive MRSA colonization surveillance program. It is not intended to diagnose MRSA infection nor to guide or monitor treatment for MRSA infections. Performed at [facility], [address] [place], [place] [address]     Management plans discussed with the patient, family and they are in agreement.  CODE STATUS:     Code Status Orders  (From admission, onward)         Start     Ordered   11/12/19 0331  Full code  Continuous        11/12/19 0333        Code Status History    This patient has a current code status but no historical code status.   Advance Care Planning Activity      TOTAL TIME TAKING CARE OF THIS PATIENT: 34 minutes.    Loletha Grayer M.D on 11/14/2019 at 2:10 PM  Between 7am to 6pm - Pager - [phone]  After 6pm go to [email or web address] - password [email or web address]  [facility]  CC: Primary care physician; Hortencia Pilar, MD

## 2019-11-17 LAB — CULTURE, BLOOD (ROUTINE X 2)
Culture: NO GROWTH
Special Requests: ADEQUATE

## 2019-11-17 LAB — AEROBIC/ANAEROBIC CULTURE W GRAM STAIN (SURGICAL/DEEP WOUND): Gram Stain: NONE SEEN

## 2019-11-24 ENCOUNTER — Inpatient Hospital Stay: Admission: RE | Admit: 2019-11-24 | Payer: Medicare Other | Source: Ambulatory Visit

## 2019-11-26 ENCOUNTER — Other Ambulatory Visit: Payer: Self-pay

## 2019-11-26 ENCOUNTER — Encounter: Payer: Self-pay | Admitting: General Surgery

## 2019-11-26 ENCOUNTER — Ambulatory Visit (INDEPENDENT_AMBULATORY_CARE_PROVIDER_SITE_OTHER): Payer: Medicare Other | Admitting: General Surgery

## 2019-11-26 VITALS — BP 176/83 | HR 99 | Temp 99.0°F | Resp 16 | Ht 60.0 in | Wt 145.0 lb

## 2019-11-26 DIAGNOSIS — J449 Chronic obstructive pulmonary disease, unspecified: Secondary | ICD-10-CM | POA: Insufficient documentation

## 2019-11-26 DIAGNOSIS — L02211 Cutaneous abscess of abdominal wall: Secondary | ICD-10-CM

## 2019-11-26 DIAGNOSIS — F4310 Post-traumatic stress disorder, unspecified: Secondary | ICD-10-CM | POA: Insufficient documentation

## 2019-11-26 DIAGNOSIS — F3160 Bipolar disorder, current episode mixed, unspecified: Secondary | ICD-10-CM | POA: Insufficient documentation

## 2019-11-26 DIAGNOSIS — F209 Schizophrenia, unspecified: Secondary | ICD-10-CM | POA: Insufficient documentation

## 2019-11-26 NOTE — Progress Notes (Signed)
Patient ID: Mary Nicholson, female   DOB: 04-08-1956, 64 y.o.   MRN: [id number]  Chief Complaint  Patient presents with  . Follow-up    staph infection    HPI Mary Nicholson is a 64 y.o. female.   She is here today to follow-up from her recent hospital admission.  She had undergone I&D of a cutaneous abscess on her abdomen, however it failed to improve and she had spreading erythema.  She was admitted to the hospitalist service with general surgery in consultation.  She did not require any further surgical debridement, responding well to IV antibiotics.  She is here today to have the site evaluated.  She states that wound care is no longer packing the site.  They are washing the area and applying a dry dressing.  She said that this morning, when the dressing was removed, there was a fair amount of bleeding.  This stopped with direct pressure applied by her daughter.  She denies any fevers or chills.  No nausea or vomiting.  She is not experiencing any significant pain.  She has completed her outpatient course of oral antibiotics.   Past Medical History:  Diagnosis Date  . Cancer ([place])    cervical  . Diabetes mellitus without complication [facility])     Past Surgical History:  Procedure Laterality Date  . ABDOMINAL HYSTERECTOMY    . BACK SURGERY    . BREAST BIOPSY Right    negative 1988  . CHOLECYSTECTOMY      Family History  Problem Relation Age of Onset  . Diabetes Mother   . Heart failure Mother   . Alzheimer's disease Father   . COPD Father     Social History Social History   Tobacco Use  . Smoking status: Former Smoker    Quit date: 10/26/2019    Years since quitting: 0.0  . Smokeless tobacco: Never Used  Substance Use Topics  . Alcohol use: Never  . Drug use: Never    No Known Allergies  Current Outpatient Medications  Medication Sig Dispense Refill  . albuterol (VENTOLIN HFA) 108 (90 Base) MCG/ACT inhaler Inhale into the lungs every 6 (six) hours as  needed for wheezing or shortness of breath.    Marland Kitchen aspirin 81 MG chewable tablet Chew by mouth.    Marland Kitchen atorvastatin (LIPITOR) 80 MG tablet Take 80 mg by mouth daily.    . busPIRone (BUSPAR) 15 MG tablet Take 15 mg by mouth 3 (three) times daily.    . chlorhexidine (HIBICLENS) 4 % external liquid Apply topically daily as needed. 946 mL 0  . CREON 24000-76000 units CPEP Take by mouth.    . cyanocobalamin (,VITAMIN B-12,) 1000 MCG/ML injection Inject into the muscle.    . cyclobenzaprine (FLEXERIL) 10 MG tablet Take 10 mg by mouth 3 (three) times daily as needed.    . famotidine (PEPCID) 40 MG tablet Take 40 mg by mouth daily.    . fluconazole (DIFLUCAN) 150 MG tablet Take 150 mg by mouth once.    . fluticasone (FLONASE) 50 MCG/ACT nasal spray Place 2 sprays into both nostrils daily.     . Fluticasone-Umeclidin-Vilant (TRELEGY ELLIPTA) 100-62.5-25 MCG/INH AEPB Inhale into the lungs.    . gabapentin (NEURONTIN) 800 MG tablet Take 800 mg by mouth 3 (three) times daily.    Marland Kitchen glipiZIDE (GLUCOTROL XL) 5 MG 24 hr tablet Take 5 mg by mouth daily.    . hydrOXYzine (VISTARIL) 25 MG capsule Take by mouth.    Marland Kitchen  lamoTRIgine (LAMICTAL) 200 MG tablet Take 200 mg by mouth 2 (two) times daily.    Marland Kitchen levothyroxine (SYNTHROID) 100 MCG tablet Take 100 mcg by mouth daily.    . meloxicam (MOBIC) 15 MG tablet Take 15 mg by mouth daily.    . metFORMIN (GLUCOPHAGE-XR) 500 MG 24 hr tablet Take 1,000 mg by mouth 2 (two) times daily.    . metoprolol succinate (TOPROL-XL) 50 MG 24 hr tablet Take 50 mg by mouth daily.    Glory Rosebush Delica Lancets 65L MISC Apply 1 each topically 3 (three) times daily.    Glory Rosebush ULTRA test strip 3 (three) times daily.    Marland Kitchen SM IRON 325 (65 Fe) MG tablet Take by mouth.    . traZODone (DESYREL) 100 MG tablet Take 100 mg by mouth at bedtime as needed.     . TRELEGY ELLIPTA 100-62.5-25 MCG/INH AEPB Inhale 1 puff into the lungs daily.    Marland Kitchen venlafaxine XR (EFFEXOR-XR) 75 MG 24 hr capsule Take 150 mg  by mouth in the morning and at bedtime.      No current facility-administered medications for this visit.    Review of Systems Review of Systems  HENT: Positive for tinnitus.   All other systems reviewed and are negative.   Blood pressure (!) 176/83, pulse 99, temperature 99 F (37.2 C), temperature source Oral, resp. rate 16, height 5' (1.524 m), weight 145 lb (65.8 kg), SpO2 94 %. Body mass index is 28.32 kg/m.  Physical Exam Physical Exam Constitutional:      General: She is not in acute distress.    Appearance: Normal appearance.  HENT:     Head: Normocephalic and atraumatic.     Nose:     Comments: Covered with a mask    Mouth/Throat:     Comments: Covered with a mask Eyes:     General:        Right eye: No discharge.        Left eye: No discharge.  Cardiovascular:     Rate and Rhythm: Normal rate.  Pulmonary:     Effort: Pulmonary effort is normal. No respiratory distress.  Abdominal:       Comments: Superficial wound that has started to scab over in the area of the prior abscess.  All of the previously present surrounding erythema and induration has completely resolved.  Genitourinary:    Comments: Deferred Musculoskeletal:        General: No deformity or signs of injury.  Skin:    General: Skin is warm and dry.  Neurological:     General: No focal deficit present.     Mental Status: She is alert and oriented to person, place, and time.  Psychiatric:        Mood and Affect: Mood normal.        Behavior: Behavior normal.     Data Reviewed The culture taken during her hospitalization demonstrated methicillin sensitive staph aureus.  Assessment This is a 64 year old woman whom I saw in the emergency department due to cellulitis.  She did not require any surgical intervention.  Plan At this point, I think her wound can simply be covered with a Band-Aid to prevent any soilage of her clothing.  She should continue to wash it daily, pat it dry, and cover.   I will see her on an as-needed basis.    Fredirick Maudlin 11/26/2019, 11:39 AM

## 2019-11-26 NOTE — Patient Instructions (Signed)
Please continue to wash the area with soap and water and rinse well. You may place a large band-aid over the area. Please call if you have question or concerns.

## 2020-01-13 ENCOUNTER — Other Ambulatory Visit: Payer: Medicare Other

## 2020-03-24 LAB — HEMOGLOBIN A1C: Hemoglobin A1C: 7.4

## 2020-04-20 ENCOUNTER — Emergency Department
Admission: EM | Admit: 2020-04-20 | Discharge: 2020-04-20 | Disposition: A | Discharge status: Home / Self Care | Payer: Medicare Other | Attending: Emergency Medicine | Admitting: Emergency Medicine

## 2020-04-20 ENCOUNTER — Other Ambulatory Visit: Payer: Self-pay

## 2020-04-20 ENCOUNTER — Emergency Department: Payer: Medicare Other

## 2020-04-20 DIAGNOSIS — J449 Chronic obstructive pulmonary disease, unspecified: Secondary | ICD-10-CM | POA: Diagnosis not present

## 2020-04-20 DIAGNOSIS — Z8541 Personal history of malignant neoplasm of cervix uteri: Secondary | ICD-10-CM | POA: Diagnosis not present

## 2020-04-20 DIAGNOSIS — U071 COVID-19: Secondary | ICD-10-CM

## 2020-04-20 DIAGNOSIS — Z7982 Long term (current) use of aspirin: Secondary | ICD-10-CM | POA: Diagnosis not present

## 2020-04-20 DIAGNOSIS — E039 Hypothyroidism, unspecified: Secondary | ICD-10-CM | POA: Diagnosis not present

## 2020-04-20 DIAGNOSIS — Z87891 Personal history of nicotine dependence: Secondary | ICD-10-CM | POA: Insufficient documentation

## 2020-04-20 DIAGNOSIS — Z79899 Other long term (current) drug therapy: Secondary | ICD-10-CM | POA: Diagnosis not present

## 2020-04-20 DIAGNOSIS — Z716 Tobacco abuse counseling: Secondary | ICD-10-CM

## 2020-04-20 DIAGNOSIS — E860 Dehydration: Secondary | ICD-10-CM

## 2020-04-20 DIAGNOSIS — N1832 Chronic kidney disease, stage 3b: Secondary | ICD-10-CM | POA: Diagnosis not present

## 2020-04-20 DIAGNOSIS — Z7984 Long term (current) use of oral hypoglycemic drugs: Secondary | ICD-10-CM | POA: Diagnosis not present

## 2020-04-20 DIAGNOSIS — E1122 Type 2 diabetes mellitus with diabetic chronic kidney disease: Secondary | ICD-10-CM | POA: Insufficient documentation

## 2020-04-20 DIAGNOSIS — E1140 Type 2 diabetes mellitus with diabetic neuropathy, unspecified: Secondary | ICD-10-CM | POA: Diagnosis not present

## 2020-04-20 DIAGNOSIS — Z7951 Long term (current) use of inhaled steroids: Secondary | ICD-10-CM | POA: Diagnosis not present

## 2020-04-20 DIAGNOSIS — I129 Hypertensive chronic kidney disease with stage 1 through stage 4 chronic kidney disease, or unspecified chronic kidney disease: Secondary | ICD-10-CM | POA: Diagnosis not present

## 2020-04-20 DIAGNOSIS — R059 Cough, unspecified: Secondary | ICD-10-CM | POA: Diagnosis present

## 2020-04-20 LAB — CBC WITH DIFFERENTIAL/PLATELET
Abs Immature Granulocytes: 0.03 10*3/uL (ref 0.00–0.07)
Basophils Absolute: 0 10*3/uL (ref 0.0–0.1)
Basophils Relative: 0 %
Eosinophils Absolute: 0.1 10*3/uL (ref 0.0–0.5)
Eosinophils Relative: 1 %
HCT: 40.9 % (ref 36.0–46.0)
Hemoglobin: 13.6 g/dL (ref 12.0–15.0)
Immature Granulocytes: 0 %
Lymphocytes Relative: 33 %
Lymphs Abs: 2.9 10*3/uL (ref 0.7–4.0)
MCH: 29.8 pg (ref 26.0–34.0)
MCHC: 33.3 g/dL (ref 30.0–36.0)
MCV: 89.5 fL (ref 80.0–100.0)
Monocytes Absolute: 0.7 10*3/uL (ref 0.1–1.0)
Monocytes Relative: 8 %
Neutro Abs: 5.1 10*3/uL (ref 1.7–7.7)
Neutrophils Relative %: 58 %
Platelets: 317 10*3/uL (ref 150–400)
RBC: 4.57 MIL/uL (ref 3.87–5.11)
RDW: 14.6 % (ref 11.5–15.5)
WBC: 8.9 10*3/uL (ref 4.0–10.5)
nRBC: 0 % (ref 0.0–0.2)

## 2020-04-20 LAB — COMPREHENSIVE METABOLIC PANEL
ALT: 15 U/L (ref 0–44)
AST: 18 U/L (ref 15–41)
Albumin: 4.3 g/dL (ref 3.5–5.0)
Alkaline Phosphatase: 95 U/L (ref 38–126)
Anion gap: 10 (ref 5–15)
BUN: 13 mg/dL (ref 8–23)
CO2: 23 mmol/L (ref 22–32)
Calcium: 9.6 mg/dL (ref 8.9–10.3)
Chloride: 103 mmol/L (ref 98–111)
Creatinine, Ser: 0.85 mg/dL (ref 0.44–1.00)
GFR, Estimated: >60 mL/min (ref >60)
Glucose, Bld: 121 mg/dL — ABNORMAL HIGH (ref 70–99)
Potassium: 3.9 mmol/L (ref 3.5–5.1)
Sodium: 136 mmol/L (ref 135–145)
Total Bilirubin: 0.6 mg/dL (ref 0.3–1.2)
Total Protein: 7.6 g/dL (ref 6.5–8.1)

## 2020-04-20 LAB — TROPONIN I (HIGH SENSITIVITY): Troponin I (High Sensitivity): 5 ng/L (ref <18)

## 2020-04-20 LAB — MAGNESIUM: Magnesium: 1.8 mg/dL (ref 1.7–2.4)

## 2020-04-20 LAB — LIPASE, BLOOD: Lipase: 30 U/L (ref 11–51)

## 2020-04-20 MED ORDER — ONDANSETRON HCL 4 MG PO TABS: 4.0000 mg | ORAL_TABLET | Freq: Three times a day (TID) | ORAL | 0 refills | Status: DC | PRN

## 2020-04-20 MED ORDER — LACTATED RINGERS IV BOLUS
1000.0000 mL | Freq: Once | INTRAVENOUS | Status: AC
  Administered 2020-04-20: 15:00:00 1000 mL via INTRAVENOUS

## 2020-04-20 NOTE — ED Provider Notes (Signed)
[facility] Emergency Department Provider Note  ____________________________________________   Event Date/Time   First MD Initiated Contact with Patient 04/20/20 1454     (approximate)  I have reviewed the triage vital signs and the nursing notes.   HISTORY  Chief Complaint URI   HPI Mary Nicholson is a 64 y.o. female with past medical history of COPD, ongoing tobacco abuse, DM, GERD, PTSD, acquired hypothyroidism, HTN, bipolar disorder, schizophrenia, and recent diagnosis of COVID-19 on 12/18 who presents for assessment of persistent symptoms that she feels have not improved including cough, chest tightness that is primarily posttussive, shortness of breath with exertion, nausea, diarrhea, aches, headache and sore throat.  She states she has had very poor appetite and minimal p.o. intake.  Has been taking over-the-counter cold medicines without significant relief of her symptoms.  No acute changes in symptoms today although patient is frustrated she is not getting better.  Denies EtOH or illicit drug use         Past Medical History:  Diagnosis Date  . Cancer (HCC)    cervical  . Diabetes mellitus without complication [facility])     Patient Active Problem List   Diagnosis Date Noted  . Bipolar affective disorder, mixed (HCC) 11/26/2019  . COPD (chronic obstructive pulmonary disease) (HCC) 11/26/2019  . PTSD (post-traumatic stress disorder) 11/26/2019  . Schizophrenia (HCC) 11/26/2019  . Abdominal wall cellulitis 11/12/2019  . Abdominal wall abscess   . Type 2 diabetes mellitus with diabetic neuropathy, without long-term current use of insulin (HCC)   . Hyperlipidemia   . Bilateral hand numbness 09/16/2019  . S/P hysterectomy 09/09/2019  . Stage 3b chronic kidney disease (HCC) 09/09/2019  . Cervical radiculitis 08/13/2019  . Chronic bilateral low back pain with right-sided sciatica 08/13/2019  . Lumbar disc herniation with radiculopathy  08/13/2019  . Neck pain 08/13/2019  . Numbness and tingling of both feet 07/30/2019  . Dupuytren contracture 05/22/2019  . Personal history of transient ischemic attack (TIA), and cerebral infarction without residual deficits 05/22/2019  . Resting tremor 05/22/2019  . Diverticulosis 05/20/2019  . External hemorrhoid 05/20/2019  . Abnormal cervical Papanicolaou smear 12/19/2018  . Arthralgia of both hands 12/19/2018  . Breast pain, right 12/19/2018  . Diarrhea 12/19/2018  . Dysphagia 12/19/2018  . Paresthesias 12/19/2018  . Seasonal allergies 12/19/2018  . Gastroesophageal reflux disease 09/23/2015  . Spinal stenosis of lumbar region 05/04/2015  . Allergic rhinitis 11/28/2014  . Acquired hypothyroidism 09/13/2014  . Essential hypertension 09/13/2014  . Type 2 diabetes mellitus with diabetic neuropathy (HCC) 09/13/2014    Past Surgical History:  Procedure Laterality Date  . ABDOMINAL HYSTERECTOMY    . BACK SURGERY    . BREAST BIOPSY Right    negative 1988  . CHOLECYSTECTOMY      Prior to Admission medications   Medication Sig Start Date End Date Taking? Authorizing Provider  ondansetron (ZOFRAN) 4 MG tablet Take 1 tablet (4 mg total) by mouth every 8 (eight) hours as needed for up to 10 doses for nausea or vomiting. 04/20/20  Yes Gilles Chiquito, MD  albuterol (VENTOLIN HFA) 108 (90 Base) MCG/ACT inhaler Inhale into the lungs every 6 (six) hours as needed for wheezing or shortness of breath.    [provider]  aspirin 81 MG chewable tablet Chew by mouth.    [provider]  atorvastatin (LIPITOR) 80 MG tablet Take 80 mg by mouth daily. 10/23/19   [provider]  busPIRone (BUSPAR)  15 MG tablet Take 15 mg by mouth 3 (three) times daily. 08/27/19   [provider]  chlorhexidine (HIBICLENS) 4 % external liquid Apply topically daily as needed. 11/14/19   Alford HighlandWieting, Richard, MD  CREON 24000-76000 units CPEP Take by mouth. 06/24/19   [provider]  cyclobenzaprine (FLEXERIL) 10 MG tablet Take 10 mg by mouth 3 (three) times daily as needed. 07/31/19   [provider]  famotidine (PEPCID) 40 MG tablet Take 40 mg by mouth daily. 08/27/19   [provider]  fluconazole (DIFLUCAN) 150 MG tablet Take 150 mg by mouth once. 11/19/19   [provider]  fluticasone (FLONASE) 50 MCG/ACT nasal spray Place 2 sprays into both nostrils daily.  08/27/19   [provider]  Fluticasone-Umeclidin-Vilant (TRELEGY ELLIPTA) 100-62.5-25 MCG/INH AEPB Inhale into the lungs. 11/19/19   [provider]  gabapentin (NEURONTIN) 800 MG tablet Take 800 mg by mouth 3 (three) times daily. 08/13/19   [provider]  glipiZIDE (GLUCOTROL XL) 5 MG 24 hr tablet Take 5 mg by mouth daily. 10/29/19   [provider]  hydrOXYzine (VISTARIL) 25 MG capsule Take by mouth. 06/24/19   [provider]  lamoTRIgine (LAMICTAL) 200 MG tablet Take 200 mg by mouth 2 (two) times daily. 10/30/19   [provider]  levothyroxine (SYNTHROID) 100 MCG tablet Take 100 mcg by mouth daily. 08/27/19   [provider]  meloxicam (MOBIC) 15 MG tablet Take 15 mg by mouth daily. 11/09/19   [provider]  metFORMIN (GLUCOPHAGE-XR) 500 MG 24 hr tablet Take 1,000 mg by mouth 2 (two) times daily. 09/16/19   [provider]  metoprolol succinate (TOPROL-XL) 50 MG 24 hr tablet Take 50 mg by mouth daily. 10/17/19   [provider]  OneTouch Delica Lancets 33G MISC Apply 1 each topically 3 (three) times daily. 10/15/19   [provider]  [facility] Corp[facility] ULTRA test strip 3 (three) times daily. 11/01/19   [provider]  SM IRON 325 (65 Fe) MG tablet Take by mouth. 06/24/19   [provider]  traZODone (DESYREL) 100 MG tablet Take 100 mg by mouth at bedtime as needed.  11/11/19   [provider]  TRELEGY ELLIPTA 100-62.5-25 MCG/INH AEPB Inhale 1 puff into the lungs daily.  11/19/19   [provider]  venlafaxine XR (EFFEXOR-XR) 75 MG 24 hr capsule Take 150 mg by mouth in the morning and at bedtime.  10/02/19   [provider]    Allergies Patient has no known allergies.  Family History  Problem Relation Age of Onset  . Diabetes Mother   . Heart failure Mother   . Alzheimer's disease Father   . COPD Father     Social History Social History   Tobacco Use  . Smoking status: Former Smoker    Quit date: 10/26/2019    Years since quitting: 0.4  . Smokeless tobacco: Never Used  Substance Use Topics  . Alcohol use: Never  . Drug use: Never    Review of Systems  Review of Systems  Constitutional: Positive for malaise/fatigue. Negative for chills and fever.  HENT: Positive for congestion. Negative for sore throat.   Eyes: Negative for pain.  Respiratory: Positive for cough. Negative for stridor.   Cardiovascular: Positive for chest pain ( when coughing).  Gastrointestinal: Positive for diarrhea and nausea. Negative for vomiting.  Genitourinary: Negative for dysuria.  Musculoskeletal: Positive for myalgias.  Skin: Negative for rash.  Neurological: Positive for headaches.  Negative for seizures and loss of consciousness.  Psychiatric/Behavioral: Negative for suicidal ideas.  All other systems reviewed and are negative.     ____________________________________________   PHYSICAL EXAM:  VITAL SIGNS: ED Triage Vitals  Enc Vitals Group     BP 04/20/20 1412 (!) 150/78     Pulse Rate 04/20/20 1412 91     Resp 04/20/20 1412 18     Temp 04/20/20 1412 99.6 F (37.6 C)     Temp Source 04/20/20 1412 Oral     SpO2 04/20/20 1412 98 %     Weight 04/20/20 1413 146 lb (66.2 kg)     Height 04/20/20 1413 5' (1.524 m)     Head Circumference --      Peak Flow --      Pain Score 04/20/20 1412 9     Pain Loc --      Pain Edu? --      Excl. in [place]? --    Vitals:   04/20/20 1412  BP: (!) 150/78  Pulse: 91  Resp: 18  Temp: 99.6 F  (37.6 C)  SpO2: 98%   Physical Exam Vitals and nursing note reviewed.  Constitutional:      General: She is not in acute distress.    Appearance: She is well-developed and well-nourished.  HENT:     Head: Normocephalic and atraumatic.     Right Ear: External ear normal.     Left Ear: External ear normal.     Nose: Nose normal.     Mouth/Throat:     Mouth: Mucous membranes are dry.  Eyes:     Conjunctiva/sclera: Conjunctivae normal.  Cardiovascular:     Rate and Rhythm: Normal rate and regular rhythm.     Heart sounds: No murmur heard.   Pulmonary:     Effort: Pulmonary effort is normal. No respiratory distress.     Breath sounds: Normal breath sounds.  Abdominal:     Palpations: Abdomen is soft.     Tenderness: There is no abdominal tenderness.  Musculoskeletal:        General: No edema.     Cervical back: Neck supple. No rigidity.     Right lower leg: No edema.     Left lower leg: No edema.  Skin:    General: Skin is warm and dry.     Capillary Refill: Capillary refill takes 2 to 3 seconds.  Neurological:     Mental Status: She is alert and oriented to person, place, and time.  Psychiatric:        Mood and Affect: Mood and affect and mood normal.     Cranial nerves II through XII grossly intact.  Patient has symmetric strength in all extremities.  Sensation intact light touch of all extremities. ____________________________________________   LABS (all labs ordered are listed, but only abnormal results are displayed)  Labs Reviewed  COMPREHENSIVE METABOLIC PANEL - Abnormal; Notable for the following components:      Result Value   Glucose, Bld 121 (*)    All other components within normal limits  CBC WITH DIFFERENTIAL/PLATELET  MAGNESIUM  LIPASE, BLOOD  TROPONIN I (HIGH SENSITIVITY)   ____________________________________________  EKG  Sinus rhythm with ventricular rate of 80, normal axis, unremarkable intervals, some artifact in leads I and lead II  without any clear evidence of acute ischemia or other significant delay arrhythmia ____________________________________________  RADIOLOGY  ED MD interpretation: No focal consolidation, large effusion, overt edema, already mediastinum, pneumothorax or other acute  intrathoracic process.   Official radiology report(s): DG Chest 2 View  Result Date: 04/20/2020 CLINICAL DATA:  Cough, shortness of breath. EXAM: CHEST - 2 VIEW COMPARISON:  None. FINDINGS: The heart size and mediastinal contours are within normal limits. Both lungs are clear. No pneumothorax or pleural effusion is noted. The visualized skeletal structures are unremarkable. IMPRESSION: No active cardiopulmonary disease. Electronically Signed   By: Marijo Conception M.D.   On: 04/20/2020 14:43    ____________________________________________   PROCEDURES  Procedure(s) performed (including Critical Care):  Procedures   ____________________________________________   INITIAL IMPRESSION / ASSESSMENT AND PLAN / ED COURSE      Patient presents with above to history exam for assessment of constellation of symptoms including nausea, diarrhea, myalgias, poor appetite, cough, posttussive chest tightness and general malaise and weakness that have not been getting better after she was recently diagnosed with COVID-19 on 12/18.  On arrival she is afebrile and hemodynamically stable.  She is slightly hypertensive with BP of 150/78 and I advised her to have this rechecked by her PCP in the next couple of days.  Suspect patient symptoms are likely from persistent COVID-19 infection.  Lipase of 30 not consistent with acute pancreatitis.  CMP shows no significant electrolyte or metabolic derangements.  Kidney function is within normal limits.  No evidence of cholestasis or hepatitis.  Exam is not consistent with acute cholecystitis.  EKG and troponin are not consistent with ACS or arthritis.  Presentation is not consistent with dissection and  chest x-ray shows no evidence of bacterial pneumonia, heart failure, or symptomatic effusion.  Very low suspicion for PE at this time patient has no acute symptoms and she is not hypoxic, tachycardic or tachypneic and has no evidence of right heart strain on EKG or other concerning historical exam factors.  Magnesium is within normal notes.  Patient was given some IV fluids and Zofran for some mild dehydration.  Counseled extensively importance of tobacco cessation.  Discharged stable condition.  Strict return precautions advised discussed.  Rx written for Zofran.       ____________________________________________   FINAL CLINICAL IMPRESSION(S) / ED DIAGNOSES  Final diagnoses:  [id number]  Encounter for tobacco use cessation counseling  Mild dehydration    Medications  lactated ringers bolus 1,000 mL (1,000 mLs Intravenous New Bag/Given 04/20/20 1523)     ED Discharge Orders         Ordered    ondansetron (ZOFRAN) 4 MG tablet  Every 8 hours PRN        04/20/20 1558           Note:  This document was prepared using Dragon voice recognition software and may include unintentional dictation errors.   Lucrezia Starch, MD 04/20/20 [phone]

## 2020-04-20 NOTE — ED Triage Notes (Signed)
Pt states she was tested positive for covid 12/18 and is not feeling any better, having cough with chest congestion and HA. States she sleeps sitting up due to the facial pain/pressure.

## 2020-07-14 ENCOUNTER — Encounter: Payer: Self-pay | Admitting: Family Medicine

## 2020-07-14 ENCOUNTER — Ambulatory Visit (INDEPENDENT_AMBULATORY_CARE_PROVIDER_SITE_OTHER): Payer: Medicare Other | Admitting: Family Medicine

## 2020-07-14 ENCOUNTER — Other Ambulatory Visit: Payer: Self-pay

## 2020-07-14 VITALS — BP 146/70 | HR 85 | Ht 60.0 in | Wt 148.0 lb

## 2020-07-14 DIAGNOSIS — Z9889 Other specified postprocedural states: Secondary | ICD-10-CM | POA: Diagnosis not present

## 2020-07-14 DIAGNOSIS — E1140 Type 2 diabetes mellitus with diabetic neuropathy, unspecified: Secondary | ICD-10-CM

## 2020-07-14 DIAGNOSIS — F209 Schizophrenia, unspecified: Secondary | ICD-10-CM

## 2020-07-14 DIAGNOSIS — F4310 Post-traumatic stress disorder, unspecified: Secondary | ICD-10-CM | POA: Diagnosis not present

## 2020-07-14 DIAGNOSIS — G8929 Other chronic pain: Secondary | ICD-10-CM

## 2020-07-14 DIAGNOSIS — I10 Essential (primary) hypertension: Secondary | ICD-10-CM

## 2020-07-14 DIAGNOSIS — M5441 Lumbago with sciatica, right side: Secondary | ICD-10-CM

## 2020-07-14 DIAGNOSIS — Z7689 Persons encountering health services in other specified circumstances: Secondary | ICD-10-CM | POA: Diagnosis not present

## 2020-07-14 DIAGNOSIS — F3160 Bipolar disorder, current episode mixed, unspecified: Secondary | ICD-10-CM | POA: Diagnosis not present

## 2020-07-14 NOTE — Progress Notes (Signed)
Date:  07/14/2020   Name:  Mary Nicholson   DOB:  1955-07-25   MRN:  [id number]   Chief Complaint: Establish Care  Patient is a 65 year old femle who presents for a establish care exam. The patient reports the following problems: multiple medical concerns. Health maintenance has been reviewed colonoscopy/mammogram/    Lab Results  Component Value Date   CREATININE 0.85 04/20/2020   BUN 13 04/20/2020   NA 136 04/20/2020   K 3.9 04/20/2020   CL 103 04/20/2020   CO2 23 04/20/2020   No results found for: CHOL, HDL, LDLCALC, LDLDIRECT, TRIG, CHOLHDL Lab Results  Component Value Date   TSH 3.352 11/12/2019   Lab Results  Component Value Date   HGBA1C 7.4 03/24/2020   Lab Results  Component Value Date   WBC 8.9 04/20/2020   HGB 13.6 04/20/2020   HCT 40.9 04/20/2020   MCV 89.5 04/20/2020   PLT 317 04/20/2020   Lab Results  Component Value Date   ALT 15 04/20/2020   AST 18 04/20/2020   ALKPHOS 95 04/20/2020   BILITOT 0.6 04/20/2020     Review of Systems  Constitutional: Negative.  Negative for chills, fatigue, fever and unexpected weight change.  HENT: Negative for congestion, ear discharge, ear pain, rhinorrhea, sinus pressure, sneezing and sore throat.   Eyes: Negative for photophobia, pain, discharge, redness and itching.  Respiratory: Negative for cough, shortness of breath, wheezing and stridor.   Gastrointestinal: Negative for abdominal pain, blood in stool, constipation, diarrhea, nausea and vomiting.  Endocrine: Negative for cold intolerance, heat intolerance, polydipsia, polyphagia and polyuria.  Genitourinary: Negative for dysuria, flank pain, frequency, hematuria, menstrual problem, pelvic pain, urgency, vaginal bleeding and vaginal discharge.  Musculoskeletal: Negative for arthralgias, back pain and myalgias.  Skin: Negative for rash.  Allergic/Immunologic: Negative for environmental allergies and food allergies.  Neurological: Negative for  dizziness, weakness, light-headedness, numbness and headaches.  Hematological: Negative for adenopathy. Does not bruise/bleed easily.  Psychiatric/Behavioral: Negative for dysphoric mood. The patient is not nervous/anxious.     Patient Active Problem List   Diagnosis Date Noted  . Bipolar affective disorder, mixed ([place]) 11/26/2019  . COPD (chronic obstructive pulmonary disease) ([place]) 11/26/2019  . PTSD (post-traumatic stress disorder) 11/26/2019  . Schizophrenia ([place]) 11/26/2019  . Abdominal wall cellulitis 11/12/2019  . Abdominal wall abscess   . Type 2 diabetes mellitus with diabetic neuropathy, without long-term current use of insulin ([place])   . Hyperlipidemia   . Bilateral hand numbness 09/16/2019  . S/P hysterectomy 09/09/2019  . Stage 3b chronic kidney disease ([place]) 09/09/2019  . Cervical radiculitis 08/13/2019  . Chronic bilateral low back pain with right-sided sciatica 08/13/2019  . 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 Neck pain 08/13/2019  . Numbness and tingling of both feet 07/30/2019  . Dupuytren contracture 05/22/2019  . Personal history of transient ischemic attack (TIA), and cerebral infarction without residual deficits 05/22/2019  . Resting tremor 05/22/2019  . Diverticulosis 05/20/2019  . External hemorrhoid 05/20/2019  . Abnormal cervical Papanicolaou smear 12/19/2018  . Arthralgia of both hands 12/19/2018  . Breast pain, right 12/19/2018  . Diarrhea 12/19/2018  . Dysphagia 12/19/2018  . Paresthesias 12/19/2018  . Seasonal allergies 12/19/2018  . Gastroesophageal reflux disease 09/23/2015  . Spinal stenosis of lumbar region 05/04/2015  . Allergic rhinitis 11/28/2014  . Acquired hypothyroidism 09/13/2014  . Essential hypertension 09/13/2014  . Type 2 diabetes mellitus with diabetic neuropathy ([place]) 09/13/2014  No Known Allergies  Past Surgical History:  Procedure Laterality Date  . ABDOMINAL HYSTERECTOMY    . BACK SURGERY    .  BREAST BIOPSY Right    negative 1988  . CHOLECYSTECTOMY      Social History   Tobacco Use  . Smoking status: Former Smoker    Quit date: 10/26/2019    Years since quitting: 0.7  . Smokeless tobacco: Never Used  Substance Use Topics  . Alcohol use: Never  . Drug use: Never     Medication list has been reviewed and updated.  Current Meds  Medication Sig  . Acetaminophen (TYLENOL ARTHRITIS EXT RELIEF PO) Take 1 capsule by mouth 3 (three) times daily.  Marland Kitchen albuterol (VENTOLIN HFA) 108 (90 Base) MCG/ACT inhaler Inhale into the lungs every 6 (six) hours as needed for wheezing or shortness of breath.  Marland Kitchen aspirin 81 MG chewable tablet Chew by mouth.  Marland Kitchen atorvastatin (LIPITOR) 80 MG tablet Take 80 mg by mouth daily.  Marland Kitchen b complex vitamins capsule Take 1 capsule by mouth daily.  . Biotin (BIOTIN 5000) 5 MG CAPS Take 1 capsule by mouth daily.  . busPIRone (BUSPAR) 15 MG tablet Take 15 mg by mouth 3 (three) times daily.  . Cetirizine HCl (ALLERGY RELIEF) 10 MG CAPS Take 1 capsule by mouth daily.  . cyclobenzaprine (FLEXERIL) 10 MG tablet Take 10 mg by mouth 3 (three) times daily as needed.  . famotidine (PEPCID) 40 MG tablet Take 40 mg by mouth daily.  . Fluticasone-Umeclidin-Vilant (TRELEGY ELLIPTA) 100-62.5-25 MCG/INH AEPB Inhale into the lungs.  . gabapentin (NEURONTIN) 800 MG tablet Take 800 mg by mouth 3 (three) times daily.  Marland Kitchen glipiZIDE (GLUCOTROL XL) 10 MG 24 hr tablet Take 10 mg by mouth daily.  . hydrochlorothiazide (HYDRODIURIL) 25 MG tablet Take 1 tablet by mouth daily.  Marland Kitchen levothyroxine (SYNTHROID) 100 MCG tablet Take 100 mcg by mouth daily.  . meloxicam (MOBIC) 15 MG tablet Take 15 mg by mouth daily.  . metFORMIN (GLUCOPHAGE-XR) 500 MG 24 hr tablet Take 1,000 mg by mouth 2 (two) times daily.  . metoprolol succinate (TOPROL-XL) 50 MG 24 hr tablet Take 50 mg by mouth daily.  . Multiple Vitamins-Minerals (CENTRUM SILVER PO) Take 1 each by mouth daily.  . Multiple Vitamins-Minerals  (PRESERVISION AREDS) CAPS Take 1 capsule by mouth 2 (two) times daily.  . ondansetron (ZOFRAN) 4 MG tablet Take 1 tablet (4 mg total) by mouth every 8 (eight) hours as needed for up to 10 doses for nausea or vomiting.  Glory Rosebush Delica Lancets 18E MISC Apply 1 each topically 3 (three) times daily.  Glory Rosebush ULTRA test strip 3 (three) times daily.  Marland Kitchen SM IRON 325 (65 Fe) MG tablet Take by mouth.  . traZODone (DESYREL) 50 MG tablet Take 50 mg by mouth at bedtime as needed.  . TRELEGY ELLIPTA 100-62.5-25 MCG/INH AEPB Inhale 1 puff into the lungs daily.  Marland Kitchen venlafaxine XR (EFFEXOR-XR) 75 MG 24 hr capsule Take 150 mg by mouth in the morning and at bedtime.     PHQ 2/9 Scores 07/14/2020  PHQ - 2 Score 1  PHQ- 9 Score 5    GAD 7 : Generalized Anxiety Score 07/14/2020  Nervous, Anxious, on Edge 2  Control/stop worrying 3  Worry too much - different things 3  Trouble relaxing 2  Restless 1  Easily annoyed or irritable 2  Afraid - awful might happen 0  Total GAD 7 Score 13  Anxiety Difficulty Very  difficult    BP Readings from Last 3 Encounters:  07/14/20 (!) 146/70  04/20/20 (!) 150/78  11/26/19 (!) 176/83    Physical Exam Vitals and nursing note reviewed.  Constitutional:      Appearance: She is well-developed.  HENT:     Head: Normocephalic.     Right Ear: External ear normal.     Left Ear: External ear normal.  Eyes:     General: Lids are everted, no foreign bodies appreciated. No scleral icterus.       Left eye: No foreign body or hordeolum.     Conjunctiva/sclera: Conjunctivae normal.     Right eye: Right conjunctiva is not injected.     Left eye: Left conjunctiva is not injected.     Pupils: Pupils are equal, round, and reactive to light.  Neck:     Thyroid: No thyromegaly.     Vascular: No JVD.     Trachea: No tracheal deviation.  Cardiovascular:     Rate and Rhythm: Normal rate and regular rhythm.     Heart sounds: Normal heart sounds. No murmur heard. No friction  rub. No gallop.   Pulmonary:     Effort: Pulmonary effort is normal. No respiratory distress.     Breath sounds: Normal breath sounds. No wheezing or rales.  Abdominal:     General: Bowel sounds are normal.     Palpations: Abdomen is soft. There is no mass.     Tenderness: There is no abdominal tenderness. There is no guarding or rebound.  Musculoskeletal:        General: No tenderness. Normal range of motion.     Cervical back: Normal range of motion and neck supple.  Lymphadenopathy:     Cervical: No cervical adenopathy.  Skin:    General: Skin is warm.     Findings: No rash.  Neurological:     Mental Status: She is alert and oriented to person, place, and time.     Cranial Nerves: No cranial nerve deficit.     Deep Tendon Reflexes: Reflexes normal.  Psychiatric:        Mood and Affect: Mood is not anxious or depressed.     Wt Readings from Last 3 Encounters:  07/14/20 148 lb (67.1 kg)  04/20/20 146 lb (66.2 kg)  11/26/19 145 lb (65.8 kg)    BP (!) 146/70   Pulse 85   Ht 5' (1.524 m)   Wt 148 lb (67.1 kg)   SpO2 96%   BMI 28.90 kg/m   Assessment and Plan:  1. Establishing care with new doctor, encounter for Patient establishing care with new physician.  Patient has multiple medical concerns including diabetes, hypertension, hyperlipidemia, hypothyroid, and allergic rhinitis among others.  Below will be several specialties that we have consulted.  2. Schizophrenia, unspecified type ([place]) She has a history of schizophrenia which is on multiple medications and will refer to psychiatry for evaluation and continuing care - Ambulatory referral to Psychiatry  3. PTSD (post-traumatic stress disorder) She has a history of PTSD and patient is on multiple medications and will be followed by psychiatry as well. - Ambulatory referral to Psychiatry  4. Bipolar affective disorder, mixed ([place]) Patient has bipolar affective disorder for which she is on multiple medications and  this will be referred to psychiatry for evaluation and continued therapy. - Ambulatory referral to Psychiatry  5. Chronic bilateral low back pain with right-sided sciatica Patient is on high-dose gabapentin for her spinal stenosis.  We will refer to orthopedics for her spinal stenosis and continued pain management. - Ambulatory referral to Orthopedic Surgery  6. Type 2 diabetes mellitus with diabetic neuropathy, without long-term current use of insulin (HCC) Chronic.  Uncontrolled.  A1c on last evaluation was 7.4 previously that was greater than 8.  Patient will return in 4 weeks and will do evaluation for diabetes and continuance of her medical regimen.  7. Essential hypertension Chronic.  Uncontrolled.  Blood pressure is 146/70.  Patient will return in 4 weeks for recheck and readjustment of medication if necessary.  8. History of gynecologic surgery Patient desires to be referred to gynecology for her Pap smears and mammograms and continuance of well woman care.  - Ambulatory referral to Gynecology

## 2020-07-22 ENCOUNTER — Other Ambulatory Visit: Payer: Self-pay

## 2020-07-22 ENCOUNTER — Encounter: Payer: Self-pay | Admitting: Family Medicine

## 2020-07-22 ENCOUNTER — Ambulatory Visit (INDEPENDENT_AMBULATORY_CARE_PROVIDER_SITE_OTHER): Payer: Medicare Other | Admitting: Family Medicine

## 2020-07-22 VITALS — BP 120/80 | HR 72 | Ht 60.0 in | Wt 146.0 lb

## 2020-07-22 DIAGNOSIS — I10 Essential (primary) hypertension: Secondary | ICD-10-CM

## 2020-07-22 DIAGNOSIS — S30860A Insect bite (nonvenomous) of lower back and pelvis, initial encounter: Secondary | ICD-10-CM

## 2020-07-22 DIAGNOSIS — E1140 Type 2 diabetes mellitus with diabetic neuropathy, unspecified: Secondary | ICD-10-CM | POA: Diagnosis not present

## 2020-07-22 DIAGNOSIS — E7801 Familial hypercholesterolemia: Secondary | ICD-10-CM | POA: Diagnosis not present

## 2020-07-22 DIAGNOSIS — R1319 Other dysphagia: Secondary | ICD-10-CM | POA: Diagnosis not present

## 2020-07-22 DIAGNOSIS — E039 Hypothyroidism, unspecified: Secondary | ICD-10-CM

## 2020-07-22 DIAGNOSIS — J449 Chronic obstructive pulmonary disease, unspecified: Secondary | ICD-10-CM | POA: Diagnosis not present

## 2020-07-22 DIAGNOSIS — K219 Gastro-esophageal reflux disease without esophagitis: Secondary | ICD-10-CM

## 2020-07-22 DIAGNOSIS — F17210 Nicotine dependence, cigarettes, uncomplicated: Secondary | ICD-10-CM

## 2020-07-22 MED ORDER — METFORMIN HCL ER 500 MG PO TB24: 1000.0000 mg | ORAL_TABLET | Freq: Two times a day (BID) | ORAL | 0 refills | Status: DC

## 2020-07-22 MED ORDER — ATORVASTATIN CALCIUM 80 MG PO TABS: 80.0000 mg | ORAL_TABLET | Freq: Every day | ORAL | 1 refills | Status: DC

## 2020-07-22 MED ORDER — LEVOTHYROXINE SODIUM 100 MCG PO TABS: 100.0000 ug | ORAL_TABLET | Freq: Every day | ORAL | 1 refills | Status: DC

## 2020-07-22 MED ORDER — ALBUTEROL SULFATE HFA 108 (90 BASE) MCG/ACT IN AERS: 1.0000 | INHALATION_SPRAY | Freq: Four times a day (QID) | RESPIRATORY_TRACT | 1 refills | Status: DC | PRN

## 2020-07-22 MED ORDER — METOPROLOL SUCCINATE ER 50 MG PO TB24: 50.0000 mg | ORAL_TABLET | Freq: Every day | ORAL | 1 refills | Status: DC

## 2020-07-22 MED ORDER — FAMOTIDINE 40 MG PO TABS
40.0000 mg | ORAL_TABLET | Freq: Every day | ORAL | 1 refills | Status: DC
Start: 2020-07-22 — End: 2021-01-21

## 2020-07-22 MED ORDER — HYDROCHLOROTHIAZIDE 25 MG PO TABS: 25.0000 mg | ORAL_TABLET | Freq: Every day | ORAL | 1 refills | Status: DC

## 2020-07-22 MED ORDER — GLIPIZIDE ER 10 MG PO TB24: 10.0000 mg | ORAL_TABLET | Freq: Every day | ORAL | 0 refills | Status: DC

## 2020-07-22 MED ORDER — TRELEGY ELLIPTA 100-62.5-25 MCG/INH IN AEPB: 1.0000 | INHALATION_SPRAY | Freq: Every day | RESPIRATORY_TRACT | 3 refills | Status: DC

## 2020-07-22 MED ORDER — MUPIROCIN 2 % EX OINT: 1.0000 "application " | TOPICAL_OINTMENT | Freq: Two times a day (BID) | CUTANEOUS | 0 refills | Status: DC

## 2020-07-22 NOTE — Progress Notes (Signed)
Date:  07/22/2020   Name:  Mary Nicholson   DOB:  [birth year]   MRN:  [id number]   Chief Complaint: COPD, Diabetes, Hyperlipidemia, and Hypertension  COPD There is no chest tightness, cough, difficulty breathing, frequent throat clearing, hemoptysis, hoarse voice, shortness of breath, sputum production or wheezing. This is a chronic problem. The current episode started more than 1 year ago. The problem occurs daily. The problem has been waxing and waning. Pertinent negatives include no chest pain, ear pain, fever, headaches, myalgias, rhinorrhea, sneezing, sore throat or weight loss. Her symptoms are alleviated by beta-agonist, ipratropium and steroid inhaler. Her past medical history is significant for COPD.  Diabetes She presents for her follow-up diabetic visit. She has type 2 diabetes mellitus. Her disease course has been stable. There are no hypoglycemic associated symptoms. Pertinent negatives for hypoglycemia include no dizziness, headaches or nervousness/anxiousness. Pertinent negatives for diabetes include no blurred vision, no chest pain, no fatigue, no foot paresthesias, no foot ulcerations, no polydipsia, no polyphagia, no polyuria, no visual change, no weakness and no weight loss. There are no hypoglycemic complications. Symptoms are stable. There are no diabetic complications. Pertinent negatives for diabetic complications include no CVA, PVD or retinopathy. Risk factors for coronary artery disease include hypertension and dyslipidemia. Current diabetic treatment includes oral agent (dual therapy) (metformen/glipizide). She is compliant with treatment some of the time.  Hyperlipidemia This is a chronic problem. The current episode started more than 1 year ago. Exacerbating diseases include diabetes and hypothyroidism. She has no history of chronic renal disease. Pertinent negatives include no chest pain, focal sensory loss, focal weakness, myalgias or shortness of breath. Current  antihyperlipidemic treatment includes statins.  Hypertension This is a chronic problem. The current episode started more than 1 year ago. The problem is unchanged. The problem is controlled. Pertinent negatives include no blurred vision, chest pain, headaches, palpitations or shortness of breath. Past treatments include diuretics and beta blockers. The current treatment provides moderate improvement. There is no history of angina, kidney disease, CAD/MI, CVA, heart failure, left ventricular hypertrophy, PVD or retinopathy. Identifiable causes of hypertension include a thyroid problem. There is no history of chronic renal disease, a hypertension causing med or renovascular disease.  Thyroid Problem Presents for follow-up visit. Symptoms include depressed mood, heat intolerance and nail problem. Patient reports no anxiety, cold intolerance, constipation, diarrhea, dry skin, fatigue, hair loss, hoarse voice, leg swelling, menstrual problem, palpitations, visual change, weight gain or weight loss. Her past medical history is significant for diabetes and hyperlipidemia. There is no history of heart failure.    Lab Results  Component Value Date   CREATININE 0.85 04/20/2020   BUN 13 04/20/2020   NA 136 04/20/2020   K 3.9 04/20/2020   CL 103 04/20/2020   CO2 23 04/20/2020   No results found for: CHOL, HDL, LDLCALC, LDLDIRECT, TRIG, CHOLHDL Lab Results  Component Value Date   TSH 3.352 11/12/2019   Lab Results  Component Value Date   HGBA1C 7.4 03/24/2020   Lab Results  Component Value Date   WBC 8.9 04/20/2020   HGB 13.6 04/20/2020   HCT 40.9 04/20/2020   MCV 89.5 04/20/2020   PLT 317 04/20/2020   Lab Results  Component Value Date   ALT 15 04/20/2020   AST 18 04/20/2020   ALKPHOS 95 04/20/2020   BILITOT 0.6 04/20/2020     Review of Systems  Constitutional: Negative.  Negative for chills, fatigue, fever, unexpected weight change, weight gain and  weight loss.  HENT: Negative for  congestion, ear discharge, ear pain, hoarse voice, rhinorrhea, sinus pressure, sneezing and sore throat.   Eyes: Negative for blurred vision, photophobia, pain, discharge, redness and itching.  Respiratory: Negative for cough, hemoptysis, sputum production, shortness of breath, wheezing and stridor.   Cardiovascular: Negative for chest pain and palpitations.  Gastrointestinal: Negative for abdominal pain, blood in stool, constipation, diarrhea, nausea and vomiting.  Endocrine: Positive for heat intolerance. Negative for cold intolerance, polydipsia, polyphagia and polyuria.  Genitourinary: Negative for dysuria, flank pain, frequency, hematuria, menstrual problem, pelvic pain, urgency, vaginal bleeding and vaginal discharge.  Musculoskeletal: Negative for arthralgias, back pain and myalgias.  Skin: Negative for rash.  Allergic/Immunologic: Negative for environmental allergies and food allergies.  Neurological: Negative for dizziness, focal weakness, weakness, light-headedness, numbness and headaches.  Hematological: Negative for adenopathy. Does not bruise/bleed easily.  Psychiatric/Behavioral: Negative for dysphoric mood. The patient is not nervous/anxious.     Patient Active Problem List   Diagnosis Date Noted  . Bipolar affective disorder, mixed ([place]) 11/26/2019  . COPD (chronic obstructive pulmonary disease) ([place]) 11/26/2019  . PTSD (post-traumatic stress disorder) 11/26/2019  . Schizophrenia ([place]) 11/26/2019  . Abdominal wall cellulitis 11/12/2019  . Abdominal wall abscess   . Type 2 diabetes mellitus with diabetic neuropathy, without long-term current use of insulin ([place])   . Hyperlipidemia   . Bilateral hand numbness 09/16/2019  . S/P hysterectomy 09/09/2019  . Stage 3b chronic kidney disease ([place]) 09/09/2019  . Cervical radiculitis 08/13/2019  . Chronic bilateral low back pain with right-sided sciatica 08/13/2019  . 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 Neck  pain 08/13/2019  . Numbness and tingling of both feet 07/30/2019  . Dupuytren contracture 05/22/2019  . Personal history of transient ischemic attack (TIA), and cerebral infarction without residual deficits 05/22/2019  . Resting tremor 05/22/2019  . Diverticulosis 05/20/2019  . External hemorrhoid 05/20/2019  . Abnormal cervical Papanicolaou smear 12/19/2018  . Arthralgia of both hands 12/19/2018  . Breast pain, right 12/19/2018  . Diarrhea 12/19/2018  . Dysphagia 12/19/2018  . Paresthesias 12/19/2018  . Seasonal allergies 12/19/2018  . Gastroesophageal reflux disease 09/23/2015  . Spinal stenosis of lumbar region 05/04/2015  . Allergic rhinitis 11/28/2014  . Acquired hypothyroidism 09/13/2014  . Essential hypertension 09/13/2014  . Type 2 diabetes mellitus with diabetic neuropathy ([place]) 09/13/2014    No Known Allergies  Past Surgical History:  Procedure Laterality Date  . ABDOMINAL HYSTERECTOMY    . BACK SURGERY    . BREAST BIOPSY Right    negative 1988  . CHOLECYSTECTOMY      Social History   Tobacco Use  . Smoking status: Former Smoker    Quit date: 10/26/2019    Years since quitting: 0.7  . Smokeless tobacco: Never Used  Substance Use Topics  . Alcohol use: Never  . Drug use: Never     Medication list has been reviewed and updated.  Current Meds  Medication Sig  . Acetaminophen (TYLENOL ARTHRITIS EXT RELIEF PO) Take 1 capsule by mouth 3 (three) times daily.  Marland Kitchen albuterol (VENTOLIN HFA) 108 (90 Base) MCG/ACT inhaler Inhale into the lungs every 6 (six) hours as needed for wheezing or shortness of breath.  Marland Kitchen aspirin 81 MG chewable tablet Chew by mouth.  Marland Kitchen atorvastatin (LIPITOR) 80 MG tablet Take 80 mg by mouth daily.  Marland Kitchen b complex vitamins capsule Take 1 capsule by mouth daily.  . Biotin 5 MG CAPS Take 1 capsule by mouth  daily.  . busPIRone (BUSPAR) 15 MG tablet Take 15 mg by mouth 3 (three) times daily.  . Cetirizine HCl (ALLERGY RELIEF) 10 MG CAPS Take 1  capsule by mouth daily.  . chlorhexidine (HIBICLENS) 4 % external liquid Apply topically daily as needed.  Marland Kitchen CREON 24000-76000 units CPEP Take by mouth.  . cyclobenzaprine (FLEXERIL) 10 MG tablet Take 10 mg by mouth 3 (three) times daily as needed.  . famotidine (PEPCID) 40 MG tablet Take 40 mg by mouth daily. otc  . fluticasone (FLONASE) 50 MCG/ACT nasal spray Place 2 sprays into both nostrils daily.   . Fluticasone-Umeclidin-Vilant (TRELEGY ELLIPTA) 100-62.5-25 MCG/INH AEPB Inhale into the lungs.  . gabapentin (NEURONTIN) 800 MG tablet Take 800 mg by mouth 3 (three) times daily.  Marland Kitchen glipiZIDE (GLUCOTROL XL) 10 MG 24 hr tablet Take 10 mg by mouth daily.  . hydrochlorothiazide (HYDRODIURIL) 25 MG tablet Take 1 tablet by mouth daily.  Marland Kitchen levothyroxine (SYNTHROID) 100 MCG tablet Take 100 mcg by mouth daily.  . meloxicam (MOBIC) 15 MG tablet Take 15 mg by mouth daily.  . metFORMIN (GLUCOPHAGE-XR) 500 MG 24 hr tablet Take 1,000 mg by mouth 2 (two) times daily.  . metoprolol succinate (TOPROL-XL) 50 MG 24 hr tablet Take 50 mg by mouth daily.  . Multiple Vitamins-Minerals (CENTRUM SILVER PO) Take 1 each by mouth daily.  . Multiple Vitamins-Minerals (PRESERVISION AREDS) CAPS Take 1 capsule by mouth 2 (two) times daily.  . ondansetron (ZOFRAN) 4 MG tablet Take 1 tablet (4 mg total) by mouth every 8 (eight) hours as needed for up to 10 doses for nausea or vomiting.  Glory Rosebush Delica Lancets 81X MISC Apply 1 each topically 3 (three) times daily.  Glory Rosebush ULTRA test strip 3 (three) times daily.  Marland Kitchen SM IRON 325 (65 Fe) MG tablet Take by mouth.  . traZODone (DESYREL) 50 MG tablet Take 50 mg by mouth at bedtime as needed.  . TRELEGY ELLIPTA 100-62.5-25 MCG/INH AEPB Inhale 1 puff into the lungs daily.  Marland Kitchen venlafaxine XR (EFFEXOR-XR) 75 MG 24 hr capsule Take 150 mg by mouth in the morning and at bedtime.   . [DISCONTINUED] fluconazole (DIFLUCAN) 150 MG tablet Take 150 mg by mouth once.    PHQ 2/9 Scores  07/14/2020  PHQ - 2 Score 1  PHQ- 9 Score 5    GAD 7 : Generalized Anxiety Score 07/14/2020  Nervous, Anxious, on Edge 2  Control/stop worrying 3  Worry too much - different things 3  Trouble relaxing 2  Restless 1  Easily annoyed or irritable 2  Afraid - awful might happen 0  Total GAD 7 Score 13  Anxiety Difficulty Very difficult    BP Readings from Last 3 Encounters:  07/22/20 120/80  07/14/20 (!) 146/70  04/20/20 (!) 150/78    Physical Exam Vitals and nursing note reviewed.  Constitutional:      General: She is not in acute distress.    Appearance: She is not diaphoretic.  HENT:     Head: Normocephalic and atraumatic.     Right Ear: External ear normal.     Left Ear: External ear normal.     Nose: Nose normal.  Eyes:     General:        Right eye: No discharge.        Left eye: No discharge.     Conjunctiva/sclera: Conjunctivae normal.     Pupils: Pupils are equal, round, and reactive to light.  Neck:  Thyroid: No thyromegaly.     Vascular: No JVD.  Cardiovascular:     Rate and Rhythm: Normal rate and regular rhythm.     Heart sounds: Normal heart sounds. No murmur heard. No friction rub. No gallop.   Pulmonary:     Effort: Pulmonary effort is normal.     Breath sounds: Normal breath sounds.  Abdominal:     General: Bowel sounds are normal.     Palpations: Abdomen is soft. There is no mass.     Tenderness: There is no abdominal tenderness. There is no guarding.  Musculoskeletal:        General: Normal range of motion.     Cervical back: Normal range of motion and neck supple.  Lymphadenopathy:     Cervical: No cervical adenopathy.  Skin:    General: Skin is warm and dry.     Findings: Erythema present.       Neurological:     Mental Status: She is alert.     Deep Tendon Reflexes: Reflexes are normal and symmetric.     Wt Readings from Last 3 Encounters:  07/22/20 146 lb (66.2 kg)  07/14/20 148 lb (67.1 kg)  04/20/20 146 lb (66.2 kg)     BP 120/80   Pulse 72   Ht 5' (1.524 m)   Wt 146 lb (66.2 kg)   BMI 28.51 kg/m   Assessment and Plan:  1. Essential hypertension Chronic.  Controlled.  Stable.  Blood pressure today is 120/80.  Patient will continue hydrochlorothiazide 25 mg once a day and metoprolol XL 50 mg 1 a day.  Will check CMP for electrolytes and GFR. - hydrochlorothiazide (HYDRODIURIL) 25 MG tablet; Take 1 tablet (25 mg total) by mouth daily.  Dispense: 90 tablet; Refill: 1 - metoprolol succinate (TOPROL-XL) 50 MG 24 hr tablet; Take 1 tablet (50 mg total) by mouth daily.  Dispense: 90 tablet; Refill: 1 - Comprehensive Metabolic Panel (CMET)  2. Type 2 diabetes mellitus with diabetic neuropathy, without long-term current use of insulin (HCC) Chronic.  Controlled.  Stable.  Patient will continue glipizide XL 10 mg and Metformin XR 500 mg 2 tablets daily for a total of 1 g.  Will check A1c and CMP and microalbuminuria at this time. - glipiZIDE (GLUCOTROL XL) 10 MG 24 hr tablet; Take 1 tablet (10 mg total) by mouth daily.  Dispense: 90 tablet; Refill: 0 - metFORMIN (GLUCOPHAGE-XR) 500 MG 24 hr tablet; Take 2 tablets (1,000 mg total) by mouth 2 (two) times daily.  Dispense: 360 tablet; Refill: 0 - HgB A1c - Comprehensive Metabolic Panel (CMET) - Microalbumin, urine  3. Chronic obstructive pulmonary disease, unspecified COPD type (HCC) Chronic.  Controlled.  Stable.  Continue albuterol and Trelegy for current COPD maintenance. - albuterol (VENTOLIN HFA) 108 (90 Base) MCG/ACT inhaler; Inhale 1-2 puffs into the lungs every 6 (six) hours as needed for wheezing or shortness of breath.  Dispense: 18 g; Refill: 1  4. Familial hypercholesterolemia Chronic.  Controlled.  Stable continue atorvastatin 80 mg once a day.  Will check lipid panel.  Check will check lipid panel for current status of LDL. - atorvastatin (LIPITOR) 80 MG tablet; Take 1 tablet (80 mg total) by mouth daily.  Dispense: 90 tablet; Refill: 1 -  Fluticasone-Umeclidin-Vilant (TRELEGY ELLIPTA) 100-62.5-25 MCG/INH AEPB; Inhale 1 puff into the lungs daily.  Dispense: 60 each; Refill: 3 - Lipid Panel With LDL/HDL Ratio  5. Acquired hypothyroidism Chronic.  Controlled.  Stable.  Continue at current dosing  levothyroxine pending TSH level. - TSH - levothyroxine (SYNTHROID) 100 MCG tablet; Take 1 tablet (100 mcg total) by mouth daily.  Dispense: 90 tablet; Refill: 1  6. Gastroesophageal reflux disease without esophagitis Chronic.  Controlled.  Stable.  Continue Pepcid 40 mg once a day.  Will refer to gastroenterology particularly for problem #7 - famotidine (PEPCID) 40 MG tablet; Take 1 tablet (40 mg total) by mouth daily. otc  Dispense: 90 tablet; Refill: 1 - Ambulatory referral to Gastroenterology  7. Esophageal dysphagia Chronic.  Episodic.  Recurrent.  Patient several years ago had endoscopy with dilation of stricture.  Patient is currently on Pepcid however she is starting to have some symptoms of dysphagia in the esophageal area. - famotidine (PEPCID) 40 MG tablet; Take 1 tablet (40 mg total) by mouth daily. otc  Dispense: 90 tablet; Refill: 1 - Ambulatory referral to Gastroenterology  8. Cigarette nicotine dependence without complication Patient has been advised of the health risks of smoking and counseled concerning cessation of tobacco products. I spent over 3 minutes for discussion and to answer questions.  9. Tick bite of lower back, initial encounter Patient has a tick bite that had a what sounds like a deer tick removed last week by her son.  There is no evidence of residual foreign body but there is some erythema to suggest local infection.  Patient will be treated with triple antibiotic care and Bactroban has been prescribed on a twice a day basis. - mupirocin ointment (BACTROBAN) 2 %; Apply 1 application topically 2 (two) times daily. For tick bite  Dispense: 22 g; Refill: 0

## 2020-07-25 ENCOUNTER — Encounter: Payer: Self-pay | Admitting: *Deleted

## 2020-07-25 DIAGNOSIS — M5489 Other dorsalgia: Secondary | ICD-10-CM | POA: Diagnosis not present

## 2020-07-27 ENCOUNTER — Encounter: Payer: Self-pay | Admitting: Advanced Practice Midwife

## 2020-07-27 ENCOUNTER — Other Ambulatory Visit (HOSPITAL_COMMUNITY)
Admission: RE | Admit: 2020-07-27 | Discharge: 2020-07-27 | Disposition: A | Discharge status: Home / Self Care | Payer: Medicare Other | Source: Ambulatory Visit | Attending: Advanced Practice Midwife | Admitting: Advanced Practice Midwife

## 2020-07-27 ENCOUNTER — Other Ambulatory Visit: Payer: Self-pay

## 2020-07-27 ENCOUNTER — Ambulatory Visit (INDEPENDENT_AMBULATORY_CARE_PROVIDER_SITE_OTHER): Payer: Medicare Other | Admitting: Advanced Practice Midwife

## 2020-07-27 VITALS — BP 143/78 | Ht 60.0 in | Wt 143.4 lb

## 2020-07-27 DIAGNOSIS — Z01419 Encounter for gynecological examination (general) (routine) without abnormal findings: Secondary | ICD-10-CM | POA: Insufficient documentation

## 2020-07-27 DIAGNOSIS — R87612 Low grade squamous intraepithelial lesion on cytologic smear of cervix (LGSIL): Secondary | ICD-10-CM | POA: Diagnosis not present

## 2020-07-27 DIAGNOSIS — Z1272 Encounter for screening for malignant neoplasm of vagina: Secondary | ICD-10-CM | POA: Diagnosis present

## 2020-07-27 DIAGNOSIS — Z1151 Encounter for screening for human papillomavirus (HPV): Secondary | ICD-10-CM | POA: Diagnosis not present

## 2020-07-27 DIAGNOSIS — Z1239 Encounter for other screening for malignant neoplasm of breast: Secondary | ICD-10-CM | POA: Diagnosis not present

## 2020-07-27 LAB — COMPREHENSIVE METABOLIC PANEL
ALT: 21 IU/L (ref 0–32)
AST: 16 IU/L (ref 0–40)
Albumin/Globulin Ratio: 1.7 (ref 1.2–2.2)
Albumin: 4.8 g/dL (ref 3.8–4.8)
Alkaline Phosphatase: 103 IU/L (ref 44–121)
BUN/Creatinine Ratio: 14 (ref 12–28)
BUN: 12 mg/dL (ref 8–27)
Bilirubin Total: 0.3 mg/dL (ref 0.0–1.2)
CO2: 23 mmol/L (ref 20–29)
Calcium: 10.3 mg/dL (ref 8.7–10.3)
Chloride: 99 mmol/L (ref 96–106)
Creatinine, Ser: 0.83 mg/dL (ref 0.57–1.00)
Globulin, Total: 2.8 g/dL (ref 1.5–4.5)
Glucose: 75 mg/dL (ref 65–99)
Potassium: 4.8 mmol/L (ref 3.5–5.2)
Sodium: 140 mmol/L (ref 134–144)
Total Protein: 7.6 g/dL (ref 6.0–8.5)
eGFR: 77 mL/min/{1.73_m2} (ref >59)

## 2020-07-27 LAB — LIPID PANEL WITH LDL/HDL RATIO
Cholesterol, Total: 148 mg/dL (ref 100–199)
HDL: 47 mg/dL (ref >39)
LDL Chol Calc (NIH): 61 mg/dL (ref 0–99)
LDL/HDL Ratio: 1.3 ratio (ref 0.0–3.2)
Triglycerides: 251 mg/dL — ABNORMAL HIGH (ref 0–149)
VLDL Cholesterol Cal: 40 mg/dL (ref 5–40)

## 2020-07-27 LAB — HEMOGLOBIN A1C
Est. average glucose Bld gHb Est-mCnc: 174 mg/dL
Hgb A1c MFr Bld: 7.7 % — ABNORMAL HIGH (ref 4.8–5.6)

## 2020-07-27 LAB — MICROALBUMIN, URINE: Microalbumin, Urine: 10.1 ug/mL

## 2020-07-27 LAB — TSH: TSH: 1.75 u[IU]/mL (ref 0.450–4.500)

## 2020-07-27 NOTE — Patient Instructions (Signed)
Health Maintenance After Age 67 After age 67, you are at a higher risk for certain long-term diseases and infections as well as injuries from falls. Falls are a major cause of broken bones and head injuries in people who are older than age 67. Getting regular preventive care can help to keep you healthy and well. Preventive care includes getting regular testing and making lifestyle changes as recommended by your health care provider. Talk with your health care provider about:  Which screenings and tests you should have. A screening is a test that checks for a disease when you have no symptoms.  A diet and exercise plan that is right for you. What should I know about screenings and tests to prevent falls? Screening and testing are the best ways to find a health problem early. Early diagnosis and treatment give you the best chance of managing medical conditions that are common after age 67. Certain conditions and lifestyle choices may make you more likely to have a fall. Your health care provider may recommend:  Regular vision checks. Poor vision and conditions such as cataracts can make you more likely to have a fall. If you wear glasses, make sure to get your prescription updated if your vision changes.  Medicine review. Work with your health care provider to regularly review all of the medicines you are taking, including over-the-counter medicines. Ask your health care provider about any side effects that may make you more likely to have a fall. Tell your health care provider if any medicines that you take make you feel dizzy or sleepy.  Osteoporosis screening. Osteoporosis is a condition that causes the bones to get weaker. This can make the bones weak and cause them to break more easily.  Blood pressure screening. Blood pressure changes and medicines to control blood pressure can make you feel dizzy.  Strength and balance checks. Your health care provider may recommend certain tests to check your  strength and balance while standing, walking, or changing positions.  Foot health exam. Foot pain and numbness, as well as not wearing proper footwear, can make you more likely to have a fall.  Depression screening. You may be more likely to have a fall if you have a fear of falling, feel emotionally low, or feel unable to do activities that you used to do.  Alcohol use screening. Using too much alcohol can affect your balance and may make you more likely to have a fall. What actions can I take to lower my risk of falls? General instructions  Talk with your health care provider about your risks for falling. Tell your health care provider if: ? You fall. Be sure to tell your health care provider about all falls, even ones that seem minor. ? You feel dizzy, sleepy, or off-balance.  Take over-the-counter and prescription medicines only as told by your health care provider. These include any supplements.  Eat a healthy diet and maintain a healthy weight. A healthy diet includes low-fat dairy products, low-fat (lean) meats, and fiber from whole grains, beans, and lots of fruits and vegetables. Home safety  Remove any tripping hazards, such as rugs, cords, and clutter.  Install safety equipment such as grab bars in bathrooms and safety rails on stairs.  Keep rooms and walkways well-lit. Activity  Follow a regular exercise program to stay fit. This will help you maintain your balance. Ask your health care provider what types of exercise are appropriate for you.  If you need a cane or walker,   use it as recommended by your health care provider.  Wear supportive shoes that have nonskid soles.   Lifestyle  Do not drink alcohol if your health care provider tells you not to drink.  If you drink alcohol, limit how much you have: ? 0-1 drink a day for women. ? 0-2 drinks a day for men.  Be aware of how much alcohol is in your drink. In the [place] one drink equals one typical bottle of beer (12  oz), one-half glass of wine (5 oz), or one shot of hard liquor (1 oz).  Do not use any products that contain nicotine or tobacco, such as cigarettes and e-cigarettes. If you need help quitting, ask your health care provider. Summary  Having a healthy lifestyle and getting preventive care can help to protect your health and wellness after age 67.  Screening and testing are the best way to find a health problem early and help you avoid having a fall. Early diagnosis and treatment give you the best chance for managing medical conditions that are more common for people who are older than age 67.  Falls are a major cause of broken bones and head injuries in people who are older than age 67. Take precautions to prevent a fall at home.  Work with your health care provider to learn what changes you can make to improve your health and wellness and to prevent falls. This information is not intended to replace advice given to you by your health care provider. Make sure you discuss any questions you have with your health care provider. Document Revised: 07/31/2018 Document Reviewed: 02/20/2017 [employer]vier Patient Education  2021 [employer]  

## 2020-07-27 NOTE — Progress Notes (Signed)
Gynecology Annual Exam  PCP: Juline Patch, MD  Chief Complaint:  Chief Complaint  Patient presents with  . Gynecologic Exam    History of Present Illness:Patient is a 65 y.o. [id number] presents for annual exam. The patient has no gyn complaints today. She is not sexually active and has no concern for STDs. Her most recent PAP smear was done 12/08/2018 LGSIL/HR HPV (with colposcopy per patient- I cannot find pathology report in chart). She has a hx of hysterectomy and still has one "partial" ovary. Previous PAP smears are vaginal specimens.   LMP: No LMP recorded. Patient is postmenopausal.   The patient is not sexually active. She denies dyspareunia.  The patient does perform self breast exams.  There is no notable family history of breast or ovarian cancer in her family.  The patient wears seatbelts: yes.   The patient has regular exercise: she helps take care of her grandchildren and great grandchildren.  She smokes about 1/2 ppd and is trying to quit.  The patient denies current symptoms of depression.     Review of Systems: Review of Systems  Constitutional: Negative for chills and fever.  HENT: Negative for congestion, ear discharge, ear pain, hearing loss, sinus pain and sore throat.   Eyes: Negative for blurred vision and double vision.  Respiratory: Negative for cough, shortness of breath and wheezing.   Cardiovascular: Negative for chest pain, palpitations and leg swelling.  Gastrointestinal: Negative for abdominal pain, blood in stool, constipation, diarrhea, heartburn, melena, nausea and vomiting.  Genitourinary: Negative for dysuria, flank pain, frequency, hematuria and urgency.  Musculoskeletal: Negative for back pain, joint pain and myalgias.  Skin: Negative for itching and rash.  Neurological: Negative for dizziness, tingling, tremors, sensory change, speech change, focal weakness, seizures, loss of consciousness, weakness and headaches.  Endo/Heme/Allergies:  Negative for environmental allergies. Does not bruise/bleed easily.  Psychiatric/Behavioral: Negative for depression, hallucinations, memory loss, substance abuse and suicidal ideas. The patient is not nervous/anxious and does not have insomnia.     Past Medical History:  Patient Active Problem List   Diagnosis Date Noted  . Bipolar affective disorder, mixed ([place]) 11/26/2019  . COPD (chronic obstructive pulmonary disease) ([place]) 11/26/2019  . PTSD (post-traumatic stress disorder) 11/26/2019  . Schizophrenia ([place]) 11/26/2019  . Abdominal wall cellulitis 11/12/2019  . Abdominal wall abscess   . Type 2 diabetes mellitus with diabetic neuropathy, without long-term current use of insulin ([place])   . Hyperlipidemia   . Bilateral hand numbness 09/16/2019  . S/P hysterectomy 09/09/2019    Formatting of this note might be different from the original. Has 1 remaining ovary   . Stage 3b chronic kidney disease ([place]) 09/09/2019  . Cervical radiculitis 08/13/2019  . Chronic bilateral low back pain with right-sided sciatica 08/13/2019  . 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 Neck pain 08/13/2019  . Numbness and tingling of both feet 07/30/2019  . Dupuytren contracture 05/22/2019  . Personal history of transient ischemic attack (TIA), and cerebral infarction without residual deficits 05/22/2019  . Resting tremor 05/22/2019  . Diverticulosis 05/20/2019    Formatting of this note might be different from the original. Noted on colonoscopy 02/25/2019   . External hemorrhoid 05/20/2019    Formatting of this note might be different from the original. Noted on colonoscopy 02/25/2019   . Abnormal cervical Papanicolaou smear 12/19/2018  . Arthralgia of both hands 12/19/2018  . Breast pain, right 12/19/2018  . Diarrhea 12/19/2018  . Dysphagia 12/19/2018  .  Paresthesias 12/19/2018  . Seasonal allergies 12/19/2018  . Gastroesophageal reflux disease 09/23/2015  . Spinal stenosis of  lumbar region 05/04/2015  . Allergic rhinitis 11/28/2014  . Acquired hypothyroidism 09/13/2014  . Essential hypertension 09/13/2014  . Type 2 diabetes mellitus with diabetic neuropathy ([place]) 09/13/2014    Past Surgical History:  Past Surgical History:  Procedure Laterality Date  . ABDOMINAL HYSTERECTOMY    . BACK SURGERY    . BREAST BIOPSY Right    negative 1988  . CHOLECYSTECTOMY      Gynecologic History:  No LMP recorded. Patient is postmenopausal. Last Pap: 12/08/2018 Results were:  low-grade squamous intraepithelial neoplasia (LGSIL - encompassing HPV,mild dysplasia,CIN I)  Last mammogram: 2 years ago Results were: BI-RAD I  Obstetric History: [id number]  Family History:  Family History  Problem Relation Age of Onset  . Diabetes Mother   . Heart failure Mother   . Alzheimer's disease Father   . COPD Father     Social History:  Social History   Socioeconomic History  . Marital status: Single    Spouse name: Not on file  . Number of children: Not on file  . Years of education: Not on file  . Highest education level: Not on file  Occupational History  . Not on file  Tobacco Use  . Smoking status: Former Smoker    Quit date: 10/26/2019    Years since quitting: 0.7  . Smokeless tobacco: Never Used  Substance and Sexual Activity  . Alcohol use: Never  . Drug use: Never  . Sexual activity: Not on file  Other Topics Concern  . Not on file  Social History Narrative  . Not on file   Social Determinants of Health   Financial Resource Strain: Not on file  Food Insecurity: Not on file  Transportation Needs: Not on file  Physical Activity: Not on file  Stress: Not on file  Social Connections: Not on file  Intimate Partner Violence: Not on file    Allergies:  No Known Allergies  Medications: Prior to Admission medications   Medication Sig Start Date End Date Taking? Authorizing Provider  Acetaminophen (TYLENOL ARTHRITIS EXT RELIEF PO) Take 1 capsule by mouth  3 (three) times daily.   Yes [provider]  albuterol (VENTOLIN HFA) 108 (90 Base) MCG/ACT inhaler Inhale 1-2 puffs into the lungs every 6 (six) hours as needed for wheezing or shortness of breath. 07/22/20  Yes Juline Patch, MD  aspirin 81 MG chewable tablet Chew by mouth.   Yes [provider]  atorvastatin (LIPITOR) 80 MG tablet Take 1 tablet (80 mg total) by mouth daily. 07/22/20  Yes Juline Patch, MD  b complex vitamins capsule Take 1 capsule by mouth daily.   Yes [provider]  Biotin 5 MG CAPS Take 1 capsule by mouth daily.   Yes [provider]  busPIRone (BUSPAR) 15 MG tablet Take 15 mg by mouth 3 (three) times daily. 08/27/19  Yes [provider]  Cetirizine HCl (ALLERGY RELIEF) 10 MG CAPS Take 1 capsule by mouth daily.   Yes [provider]  chlorhexidine (HIBICLENS) 4 % external liquid Apply topically daily as needed. 11/14/19  Yes Loletha Grayer, MD  CREON 24000-76000 units CPEP Take by mouth. 06/24/19  Yes [provider]  cyclobenzaprine (FLEXERIL) 10 MG tablet Take 10 mg by mouth 3 (three) times daily as needed. 07/31/19  Yes [provider]  famotidine (PEPCID) 40 MG tablet Take 1 tablet (40 mg  total) by mouth daily. otc 07/22/20  Yes Juline Patch, MD  fluticasone (FLONASE) 50 MCG/ACT nasal spray Place 2 sprays into both nostrils daily.  08/27/19  Yes [provider]  Fluticasone-Umeclidin-Vilant (TRELEGY ELLIPTA) 100-62.5-25 MCG/INH AEPB Inhale 1 puff into the lungs daily. 07/22/20  Yes Juline Patch, MD  gabapentin (NEURONTIN) 800 MG tablet Take 800 mg by mouth 3 (three) times daily. 08/13/19  Yes [provider]  glipiZIDE (GLUCOTROL XL) 10 MG 24 hr tablet Take 1 tablet (10 mg total) by mouth daily. 07/22/20  Yes Juline Patch, MD  hydrochlorothiazide (HYDRODIURIL) 25 MG tablet Take 1 tablet (25 mg total) by mouth daily. 07/22/20  Yes Juline Patch, MD  hydrOXYzine (VISTARIL) 25 MG capsule  Take by mouth. 06/24/19  Yes [provider]  lamoTRIgine (LAMICTAL) 200 MG tablet Take 200 mg by mouth 2 (two) times daily. 10/30/19  Yes [provider]  levothyroxine (SYNTHROID) 100 MCG tablet Take 1 tablet (100 mcg total) by mouth daily. 07/22/20  Yes Juline Patch, MD  meloxicam (MOBIC) 15 MG tablet Take 15 mg by mouth daily. 11/09/19  Yes [provider]  metFORMIN (GLUCOPHAGE-XR) 500 MG 24 hr tablet Take 2 tablets (1,000 mg total) by mouth 2 (two) times daily. 07/22/20  Yes Juline Patch, MD  metoprolol succinate (TOPROL-XL) 50 MG 24 hr tablet Take 1 tablet (50 mg total) by mouth daily. 07/22/20  Yes Juline Patch, MD  Multiple Vitamins-Minerals (CENTRUM SILVER PO) Take 1 each by mouth daily.   Yes [provider]  Multiple Vitamins-Minerals (PRESERVISION AREDS) CAPS Take 1 capsule by mouth 2 (two) times daily.   Yes [provider]  mupirocin ointment (BACTROBAN) 2 % Apply 1 application topically 2 (two) times daily. For tick bite 07/22/20  Yes Juline Patch, MD  ondansetron (ZOFRAN) 4 MG tablet Take 1 tablet (4 mg total) by mouth every 8 (eight) hours as needed for up to 10 doses for nausea or vomiting. 04/20/20  Yes Lucrezia Starch, MD  OneTouch Delica Lancets 69G MISC Apply 1 each topically 3 (three) times daily. 10/15/19  Yes [provider]  ONETOUCH ULTRA test strip 3 (three) times daily. 11/01/19  Yes [provider]  SM IRON 325 (65 Fe) MG tablet Take by mouth. 06/24/19  Yes [provider]  traZODone (DESYREL) 50 MG tablet Take 50 mg by mouth at bedtime as needed. 11/11/19  Yes [provider]  TRELEGY ELLIPTA 100-62.5-25 MCG/INH AEPB Inhale 1 puff into the lungs daily. 11/19/19  Yes [provider]  venlafaxine XR (EFFEXOR-XR) 75 MG 24 hr capsule Take 150 mg by mouth in the morning and at bedtime.  10/02/19  Yes [provider]    Physical Exam Vitals: Blood pressure (!) 143/78, height 5'  (1.524 m), weight 143 lb 6.4 oz (65 kg).  General: NAD HEENT: normocephalic, anicteric Thyroid: no enlargement, no palpable nodules Pulmonary: No increased work of breathing, CTAB Cardiovascular: RRR, distal pulses 2+ Breast: Breast symmetrical, no tenderness, no palpable nodules or masses, no skin or nipple retraction present, no nipple discharge.  No axillary or supraclavicular lymphadenopathy. Abdomen: NABS, soft, non-tender, non-distended.  Umbilicus without lesions.  No hepatomegaly, splenomegaly or masses palpable. No evidence of hernia  Genitourinary:  External: Normal external female genitalia.  Normal urethral meatus, normal Bartholin's and Skene's glands.    Vagina: Normal vaginal mucosa, no evidence of prolapse.    Cervix: Grossly normal in appearance, no bleeding  Uterus:  Non-enlarged, mobile, normal contour.  No CMT  Adnexa: ovaries non-enlarged, no adnexal masses  Rectal: deferred  Lymphatic: no evidence of inguinal lymphadenopathy Extremities: no edema, erythema, or tenderness Neurologic: Grossly intact Psychiatric: mood appropriate, affect full  Female chaperone present for pelvic and breast  portions of the physical exam     Assessment: 65 y.o. [id number] routine annual exam  Plan: Problem List Items Addressed This Visit   None   Visit Diagnoses    Well woman exam with routine gynecological exam    -  Primary   Relevant Orders   Cytology - PAP   MM DIGITAL SCREENING BILATERAL   Screening for vaginal cancer       Relevant Orders   Cytology - PAP   Breast screening       Relevant Orders   MM DIGITAL SCREENING BILATERAL      1) Mammogram - recommend yearly screening mammogram.  Mammogram Was ordered today  2) STI screening  was not offered and therefore not obtained  3) ASCCP guidelines and rationale discussed.  Patient opts for every 3 years screening interval. PAP done today.  4) Osteoporosis  - per USPTF routine screening DEXA at age 62  Consider  FDA-approved medical therapies in postmenopausal women and men aged 21 years and older, based on the following: a) A hip or vertebral (clinical or morphometric) fracture b) T-score ? -2.5 at the femoral neck or spine after appropriate evaluation to exclude secondary causes C) Low bone mass (T-score between -1.0 and -2.5 at the femoral neck or spine) and a 10-year probability of a hip fracture ? 3% or a 10-year probability of a major osteoporosis-related fracture ? 20% based on the [place]-adapted WHO algorithm   5) Routine healthcare maintenance including cholesterol, diabetes screening discussed managed by PCP  6) Colonoscopy up to date- done at [facility] in 2020.  Screening recommended starting at age 16 for average risk individuals, age 45 for individuals deemed at increased risk (including African Americans) and recommended to continue until age 75.  For patient age 66-85 individualized approach is recommended.  Gold standard screening is via colonoscopy, Cologuard screening is an acceptable alternative for patient unwilling or unable to undergo colonoscopy.  "Colorectal cancer screening for average?risk adults: 2018 guideline update from [place]: A Cancer Journal for Clinicians: Sep 19, 2016   7) Return in about 1 year (around 07/27/2021) for annual established gyn.    Christean Leaf, CNM [facility] 07/27/20, 2:02 PM

## 2020-07-29 DIAGNOSIS — M48062 Spinal stenosis, lumbar region with neurogenic claudication: Secondary | ICD-10-CM | POA: Diagnosis not present

## 2020-07-29 DIAGNOSIS — M5442 Lumbago with sciatica, left side: Secondary | ICD-10-CM | POA: Diagnosis not present

## 2020-07-29 DIAGNOSIS — E1140 Type 2 diabetes mellitus with diabetic neuropathy, unspecified: Secondary | ICD-10-CM | POA: Diagnosis not present

## 2020-07-29 DIAGNOSIS — G8929 Other chronic pain: Secondary | ICD-10-CM | POA: Diagnosis not present

## 2020-08-02 LAB — CYTOLOGY - PAP
Comment: NEGATIVE
High risk HPV: NEGATIVE

## 2020-08-23 DIAGNOSIS — M5412 Radiculopathy, cervical region: Secondary | ICD-10-CM | POA: Diagnosis not present

## 2020-08-23 DIAGNOSIS — M542 Cervicalgia: Secondary | ICD-10-CM | POA: Diagnosis not present

## 2020-08-23 DIAGNOSIS — M47816 Spondylosis without myelopathy or radiculopathy, lumbar region: Secondary | ICD-10-CM | POA: Diagnosis not present

## 2020-08-23 DIAGNOSIS — G8929 Other chronic pain: Secondary | ICD-10-CM | POA: Diagnosis not present

## 2020-08-23 DIAGNOSIS — M5442 Lumbago with sciatica, left side: Secondary | ICD-10-CM | POA: Diagnosis not present

## 2020-08-23 DIAGNOSIS — M48062 Spinal stenosis, lumbar region with neurogenic claudication: Secondary | ICD-10-CM | POA: Diagnosis not present

## 2020-08-29 ENCOUNTER — Encounter: Payer: Self-pay | Admitting: Obstetrics and Gynecology

## 2020-08-29 DIAGNOSIS — M47816 Spondylosis without myelopathy or radiculopathy, lumbar region: Secondary | ICD-10-CM | POA: Diagnosis not present

## 2020-09-02 DIAGNOSIS — E113393 Type 2 diabetes mellitus with moderate nonproliferative diabetic retinopathy without macular edema, bilateral: Secondary | ICD-10-CM | POA: Diagnosis not present

## 2020-09-02 LAB — HM DIABETES EYE EXAM

## 2020-09-05 DIAGNOSIS — M47816 Spondylosis without myelopathy or radiculopathy, lumbar region: Secondary | ICD-10-CM | POA: Diagnosis not present

## 2020-09-05 DIAGNOSIS — M48062 Spinal stenosis, lumbar region with neurogenic claudication: Secondary | ICD-10-CM | POA: Diagnosis not present

## 2020-09-05 DIAGNOSIS — G8929 Other chronic pain: Secondary | ICD-10-CM | POA: Diagnosis not present

## 2020-09-05 DIAGNOSIS — M542 Cervicalgia: Secondary | ICD-10-CM | POA: Diagnosis not present

## 2020-09-05 DIAGNOSIS — M5441 Lumbago with sciatica, right side: Secondary | ICD-10-CM | POA: Diagnosis not present

## 2020-09-05 DIAGNOSIS — M5412 Radiculopathy, cervical region: Secondary | ICD-10-CM | POA: Diagnosis not present

## 2020-09-12 DIAGNOSIS — M47816 Spondylosis without myelopathy or radiculopathy, lumbar region: Secondary | ICD-10-CM | POA: Diagnosis not present

## 2020-09-14 DIAGNOSIS — Z1389 Encounter for screening for other disorder: Secondary | ICD-10-CM | POA: Diagnosis not present

## 2020-09-14 DIAGNOSIS — Z79899 Other long term (current) drug therapy: Secondary | ICD-10-CM | POA: Diagnosis not present

## 2020-09-21 DIAGNOSIS — M542 Cervicalgia: Secondary | ICD-10-CM | POA: Diagnosis not present

## 2020-09-21 DIAGNOSIS — M47816 Spondylosis without myelopathy or radiculopathy, lumbar region: Secondary | ICD-10-CM | POA: Diagnosis not present

## 2020-09-21 DIAGNOSIS — M9931 Osseous stenosis of neural canal of cervical region: Secondary | ICD-10-CM | POA: Diagnosis not present

## 2020-09-21 DIAGNOSIS — M5412 Radiculopathy, cervical region: Secondary | ICD-10-CM | POA: Diagnosis not present

## 2020-09-21 DIAGNOSIS — R200 Anesthesia of skin: Secondary | ICD-10-CM | POA: Diagnosis not present

## 2020-09-21 DIAGNOSIS — M25562 Pain in left knee: Secondary | ICD-10-CM | POA: Diagnosis not present

## 2020-09-27 ENCOUNTER — Telehealth: Payer: Self-pay | Admitting: Family Medicine

## 2020-09-27 NOTE — Telephone Encounter (Signed)
Copied from [place]. Topic: Medicare AWV >> Sep 27, 2020 10:35 AM Cher Nakai R wrote: Reason for GNF:AOZH message for patient to call back and schedule Medicare Annual Wellness Visit (AWV) in office.   If unable to come into the office for AWV,  please offer to do virtually or by telephone.  Last AWV:  01/22/2016 [facility] per [place]  Please schedule at anytime with [facility] Health Advisor.  40 minute appointment  Any questions, please contact me at [phone]

## 2020-09-30 DIAGNOSIS — M9931 Osseous stenosis of neural canal of cervical region: Secondary | ICD-10-CM | POA: Diagnosis not present

## 2020-09-30 DIAGNOSIS — E1140 Type 2 diabetes mellitus with diabetic neuropathy, unspecified: Secondary | ICD-10-CM | POA: Diagnosis not present

## 2020-10-01 ENCOUNTER — Other Ambulatory Visit: Payer: Self-pay

## 2020-10-01 DIAGNOSIS — R531 Weakness: Secondary | ICD-10-CM | POA: Insufficient documentation

## 2020-10-01 DIAGNOSIS — N1832 Chronic kidney disease, stage 3b: Secondary | ICD-10-CM | POA: Diagnosis not present

## 2020-10-01 DIAGNOSIS — E1169 Type 2 diabetes mellitus with other specified complication: Secondary | ICD-10-CM | POA: Diagnosis not present

## 2020-10-01 DIAGNOSIS — Z7984 Long term (current) use of oral hypoglycemic drugs: Secondary | ICD-10-CM | POA: Insufficient documentation

## 2020-10-01 DIAGNOSIS — Z85828 Personal history of other malignant neoplasm of skin: Secondary | ICD-10-CM | POA: Insufficient documentation

## 2020-10-01 DIAGNOSIS — E039 Hypothyroidism, unspecified: Secondary | ICD-10-CM | POA: Diagnosis not present

## 2020-10-01 DIAGNOSIS — E785 Hyperlipidemia, unspecified: Secondary | ICD-10-CM | POA: Diagnosis not present

## 2020-10-01 DIAGNOSIS — Z79899 Other long term (current) drug therapy: Secondary | ICD-10-CM | POA: Insufficient documentation

## 2020-10-01 DIAGNOSIS — R4702 Dysphasia: Secondary | ICD-10-CM | POA: Diagnosis not present

## 2020-10-01 DIAGNOSIS — Z7952 Long term (current) use of systemic steroids: Secondary | ICD-10-CM | POA: Diagnosis not present

## 2020-10-01 DIAGNOSIS — I10 Essential (primary) hypertension: Secondary | ICD-10-CM | POA: Diagnosis not present

## 2020-10-01 DIAGNOSIS — Z7982 Long term (current) use of aspirin: Secondary | ICD-10-CM | POA: Diagnosis not present

## 2020-10-01 DIAGNOSIS — E1122 Type 2 diabetes mellitus with diabetic chronic kidney disease: Secondary | ICD-10-CM | POA: Insufficient documentation

## 2020-10-01 DIAGNOSIS — R29898 Other symptoms and signs involving the musculoskeletal system: Secondary | ICD-10-CM | POA: Diagnosis not present

## 2020-10-01 DIAGNOSIS — F17210 Nicotine dependence, cigarettes, uncomplicated: Secondary | ICD-10-CM | POA: Diagnosis not present

## 2020-10-01 DIAGNOSIS — J449 Chronic obstructive pulmonary disease, unspecified: Secondary | ICD-10-CM | POA: Insufficient documentation

## 2020-10-01 DIAGNOSIS — I129 Hypertensive chronic kidney disease with stage 1 through stage 4 chronic kidney disease, or unspecified chronic kidney disease: Secondary | ICD-10-CM | POA: Insufficient documentation

## 2020-10-01 DIAGNOSIS — M47812 Spondylosis without myelopathy or radiculopathy, cervical region: Secondary | ICD-10-CM | POA: Diagnosis not present

## 2020-10-01 DIAGNOSIS — N3000 Acute cystitis without hematuria: Secondary | ICD-10-CM | POA: Diagnosis not present

## 2020-10-01 DIAGNOSIS — R42 Dizziness and giddiness: Secondary | ICD-10-CM | POA: Diagnosis present

## 2020-10-01 NOTE — ED Triage Notes (Signed)
Pt arrives to ED from home via POV with daughter with c/c of facial numbness and tingling around lip, dizziness, and weakness. Current Sx began at approx 0930 this morning, Pt had difficulty speaking and weakness in her left upper extremity. Upon assessment, pt A&Ox3, NAD.

## 2020-10-02 ENCOUNTER — Emergency Department
Admission: EM | Admit: 2020-10-02 | Discharge: 2020-10-02 | Disposition: A | Discharge status: Home / Self Care | Payer: Medicare Other | Attending: Emergency Medicine | Admitting: Emergency Medicine

## 2020-10-02 ENCOUNTER — Emergency Department: Payer: Medicare Other

## 2020-10-02 DIAGNOSIS — N3000 Acute cystitis without hematuria: Secondary | ICD-10-CM | POA: Diagnosis not present

## 2020-10-02 DIAGNOSIS — I10 Essential (primary) hypertension: Secondary | ICD-10-CM | POA: Diagnosis not present

## 2020-10-02 DIAGNOSIS — R4702 Dysphasia: Secondary | ICD-10-CM | POA: Diagnosis not present

## 2020-10-02 DIAGNOSIS — R531 Weakness: Secondary | ICD-10-CM | POA: Diagnosis not present

## 2020-10-02 DIAGNOSIS — R29898 Other symptoms and signs involving the musculoskeletal system: Secondary | ICD-10-CM | POA: Diagnosis not present

## 2020-10-02 DIAGNOSIS — M47812 Spondylosis without myelopathy or radiculopathy, cervical region: Secondary | ICD-10-CM | POA: Diagnosis not present

## 2020-10-02 LAB — URINALYSIS, COMPLETE (UACMP) WITH MICROSCOPIC
Bilirubin Urine: NEGATIVE
Glucose, UA: NEGATIVE mg/dL
Hgb urine dipstick: NEGATIVE
Ketones, ur: NEGATIVE mg/dL
Nitrite: POSITIVE — AB
Protein, ur: NEGATIVE mg/dL
Specific Gravity, Urine: 1.017 (ref 1.005–1.030)
pH: 5 (ref 5.0–8.0)

## 2020-10-02 LAB — CBC
HCT: 40.2 % (ref 36.0–46.0)
Hemoglobin: 13.2 g/dL (ref 12.0–15.0)
MCH: 29.4 pg (ref 26.0–34.0)
MCHC: 32.8 g/dL (ref 30.0–36.0)
MCV: 89.5 fL (ref 80.0–100.0)
Platelets: 330 10*3/uL (ref 150–400)
RBC: 4.49 MIL/uL (ref 3.87–5.11)
RDW: 14.3 % (ref 11.5–15.5)
WBC: 11.9 10*3/uL — ABNORMAL HIGH (ref 4.0–10.5)
nRBC: 0 % (ref 0.0–0.2)

## 2020-10-02 LAB — BASIC METABOLIC PANEL
Anion gap: 8 (ref 5–15)
BUN: 23 mg/dL (ref 8–23)
CO2: 26 mmol/L (ref 22–32)
Calcium: 9.6 mg/dL (ref 8.9–10.3)
Chloride: 100 mmol/L (ref 98–111)
Creatinine, Ser: 0.96 mg/dL (ref 0.44–1.00)
GFR, Estimated: >60 mL/min (ref >60)
Glucose, Bld: 225 mg/dL — ABNORMAL HIGH (ref 70–99)
Potassium: 3.8 mmol/L (ref 3.5–5.1)
Sodium: 134 mmol/L — ABNORMAL LOW (ref 135–145)

## 2020-10-02 MED ORDER — LORAZEPAM 0.5 MG PO TABS: 0.5000 mg | ORAL_TABLET | ORAL | Status: DC | PRN

## 2020-10-02 MED ORDER — LORAZEPAM 1 MG PO TABS
1.0000 mg | ORAL_TABLET | Freq: Once | ORAL | Status: AC
  Administered 2020-10-02: 1 mg via ORAL
  Filled 2020-10-02: qty 1

## 2020-10-02 MED ORDER — SODIUM CHLORIDE 0.9 % IV SOLN
1.0000 g | Freq: Once | INTRAVENOUS | Status: AC
  Administered 2020-10-02: 1 g via INTRAVENOUS
  Filled 2020-10-02: qty 10

## 2020-10-02 MED ORDER — CEPHALEXIN 500 MG PO CAPS: 500.0000 mg | ORAL_CAPSULE | Freq: Four times a day (QID) | ORAL | 0 refills | Status: AC

## 2020-10-02 NOTE — ED Notes (Signed)
Patient transported to CT 

## 2020-10-02 NOTE — Discharge Instructions (Addendum)
We gave her a dose of IV antibiotics tonight that will last until the evening on 6/12.  Please pick up this antibiotic prescription and start taking them this evening.  You can probably just get 1 dose into her this evening, and start taking 4 times daily tomorrow on 6/13.  Continue all of her other normal medications and return to the ED with any worsening symptoms.

## 2020-10-02 NOTE — ED Provider Notes (Signed)
[facility] Emergency Department Provider Note ____________________________________________   Event Date/Time   First MD Initiated Contact with Patient 10/02/20 0007     (approximate)  I have reviewed the triage vital signs and the nursing notes.  HISTORY  Chief Complaint Dizziness and Weakness (Left side/)   HPI Mary Nicholson is a 65 y.o. femalewho presents to the ED for evaluation of dizziness and numbness.   Chart review indicates hx of bipolar disorder and schizophrenia, HTN, HLD and DM.  Cervical spinal stenosis follows with PMNR, as well as chronic lumbar pain. She got a cervical transforaminal epidural steroid injection on 6/10.  Patient presents to the ED for evaluation of left-sided weakness, gait change and dysarthria throughout the day on 6/11.  She was going to bed on 6/10 feeling well and woke up with the symptoms Saturday morning and have been waxing and waning to some degree throughout the day, but persistent.  She reports dropping food from her left hand and dropping a glass of water from her left hand this evening with dinner, which is quite unusual for her, due to this daughter brings her into the ED for evaluation.  She reports chronic lumbar pain at its baseline and improved cervical pain after her injection a couple days ago.  She further reports urinary frequency and new incontinence without dysuria or hematuria over the past few days.  Reports chills at night without fever, flank pain, emesis or abdominal pain.   Past Medical History:  Diagnosis Date   Bipolar 1 disorder ([place])    Cancer ([place])    cervical   Diabetes mellitus without complication ([place])    Hyperlipidemia    Hypertension    PTSD (post-traumatic stress disorder)    Schizophrenia ([place])    Spinal stenosis    Thyroid disease     Patient Active Problem List   Diagnosis Date Noted   Bipolar affective disorder, mixed ([place]) 11/26/2019   COPD (chronic  obstructive pulmonary disease) ([place]) 11/26/2019   PTSD (post-traumatic stress disorder) 11/26/2019   Schizophrenia ([place]) 11/26/2019   Abdominal wall cellulitis 11/12/2019   Abdominal wall abscess    Type 2 diabetes mellitus with diabetic neuropathy, without long-term current use of insulin (H[facility])    Hyperlipidemia    Bilateral hand numbness 09/16/2019   S/P hysterectomy 09/09/2019   Stage 3b chronic kidney disease ([place]) 09/09/2019   Cervical radiculitis 08/13/2019   Chronic bilateral low back pain with right-sided sciatica 08/13/2019   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   Neck pain 08/13/2019   Numbness and tingling of both feet 07/30/2019   Dupuytren contracture 05/22/2019   Personal history of transient ischemic attack (TIA), and cerebral infarction without residual deficits 05/22/2019   Resting tremor 05/22/2019   Diverticulosis 05/20/2019   External hemorrhoid 05/20/2019   Abnormal cervical Papanicolaou smear 12/19/2018   Arthralgia of both hands 12/19/2018   Breast pain, right 12/19/2018   Diarrhea 12/19/2018   Dysphagia 12/19/2018   Paresthesias 12/19/2018   Seasonal allergies 12/19/2018   Gastroesophageal reflux disease 09/23/2015   Spinal stenosis of lumbar region 05/04/2015   Allergic rhinitis 11/28/2014   Acquired hypothyroidism 09/13/2014   Essential hypertension 09/13/2014   Type 2 diabetes mellitus with diabetic neuropathy ([place]) 09/13/2014    Past Surgical History:  Procedure Laterality Date   ABDOMINAL HYSTERECTOMY     BACK SURGERY     BREAST BIOPSY Right    negative 1988   CHOLECYSTECTOMY      Prior  to Admission medications   Medication Sig Start Date End Date Taking? Authorizing Provider  cephALEXin (KEFLEX) 500 MG capsule Take 1 capsule (500 mg total) by mouth 4 (four) times daily for 4 days. 10/02/20 10/06/20 Yes Vladimir Crofts, MD  Acetaminophen (TYLENOL ARTHRITIS EXT RELIEF PO) Take 1 capsule by mouth 3 (three) times daily.    [provider]  albuterol (VENTOLIN HFA) 108 (90 Base) MCG/ACT inhaler Inhale 1-2 puffs into the lungs every 6 (six) hours as needed for wheezing or shortness of breath. 07/22/20   Juline Patch, MD  aspirin 81 MG chewable tablet Chew by mouth.    [provider]  atorvastatin (LIPITOR) 80 MG tablet Take 1 tablet (80 mg total) by mouth daily. 07/22/20   Juline Patch, MD  b complex vitamins capsule Take 1 capsule by mouth daily.    [provider]  Biotin 5 MG CAPS Take 1 capsule by mouth daily.    [provider]  busPIRone (BUSPAR) 15 MG tablet Take 15 mg by mouth 3 (three) times daily. 08/27/19   [provider]  Cetirizine HCl (ALLERGY RELIEF) 10 MG CAPS Take 1 capsule by mouth daily.    [provider]  chlorhexidine (HIBICLENS) 4 % external liquid Apply topically daily as needed. 11/14/19   Loletha Grayer, MD  CREON 24000-76000 units CPEP Take by mouth. 06/24/19   [provider]  cyclobenzaprine (FLEXERIL) 10 MG tablet Take 10 mg by mouth 3 (three) times daily as needed. 07/31/19   [provider]  famotidine (PEPCID) 40 MG tablet Take 1 tablet (40 mg total) by mouth daily. otc 07/22/20   Juline Patch, MD  fluticasone (FLONASE) 50 MCG/ACT nasal spray Place 2 sprays into both nostrils daily.  08/27/19   [provider]  Fluticasone-Umeclidin-Vilant (TRELEGY ELLIPTA) 100-62.5-25 MCG/INH AEPB Inhale 1 puff into the lungs daily. 07/22/20   Juline Patch, MD  gabapentin (NEURONTIN) 800 MG tablet Take 800 mg by mouth 3 (three) times daily. 08/13/19   [provider]  glipiZIDE (GLUCOTROL XL) 10 MG 24 hr tablet Take 1 tablet (10 mg total) by mouth daily. 07/22/20   Juline Patch, MD  hydrochlorothiazide (HYDRODIURIL) 25 MG tablet Take 1 tablet (25 mg total) by mouth daily. 07/22/20   Juline Patch, MD  hydrOXYzine (VISTARIL) 25 MG capsule Take by mouth. 06/24/19   [provider]  lamoTRIgine (LAMICTAL) 200 MG tablet  Take 200 mg by mouth 2 (two) times daily. 10/30/19   [provider]  levothyroxine (SYNTHROID) 100 MCG tablet Take 1 tablet (100 mcg total) by mouth daily. 07/22/20   Juline Patch, MD  meloxicam (MOBIC) 15 MG tablet Take 15 mg by mouth daily. 11/09/19   [provider]  metFORMIN (GLUCOPHAGE-XR) 500 MG 24 hr tablet Take 2 tablets (1,000 mg total) by mouth 2 (two) times daily. 07/22/20   Juline Patch, MD  metoprolol succinate (TOPROL-XL) 50 MG 24 hr tablet Take 1 tablet (50 mg total) by mouth daily. 07/22/20   Juline Patch, MD  Multiple Vitamins-Minerals (CENTRUM SILVER PO) Take 1 each by mouth daily.    [provider]  Multiple Vitamins-Minerals (PRESERVISION AREDS) CAPS Take 1 capsule by mouth 2 (two) times daily.    [provider]  mupirocin ointment (BACTROBAN) 2 % Apply 1 application topically 2 (two) times daily. For tick bite 07/22/20   Juline Patch, MD  ondansetron (ZOFRAN) 4 MG tablet Take 1 tablet (4  mg total) by mouth every 8 (eight) hours as needed for up to 10 doses for nausea or vomiting. 04/20/20   Lucrezia Starch, MD  OneTouch Delica Lancets 41P MISC Apply 1 each topically 3 (three) times daily. 10/15/19   [provider]  [facility] ULTRA test strip 3 (three) times daily. 11/01/19   [provider]  SM IRON 325 (65 Fe) MG tablet Take by mouth. 06/24/19   [provider]  traZODone (DESYREL) 50 MG tablet Take 50 mg by mouth at bedtime as needed. 11/11/19   [provider]  TRELEGY ELLIPTA 100-62.5-25 MCG/INH AEPB Inhale 1 puff into the lungs daily. 11/19/19   [provider]  venlafaxine XR (EFFEXOR-XR) 75 MG 24 hr capsule Take 150 mg by mouth in the morning and at bedtime.  10/02/19   [provider]    Allergies Patient has no known allergies.  Family History  Problem Relation Age of Onset   Diabetes Mother    Heart failure Mother    Ovarian cancer Mother 73       question dx?    Alzheimer's disease Father    COPD Father     Social History Social History   Tobacco Use   Smoking status: Every Day    Packs/day: 0.50    Pack years: 0.00    Types: Cigarettes    Last attempt to quit: 10/26/2019    Years since quitting: 0.9   Smokeless tobacco: Never  Substance Use Topics   Alcohol use: Yes    Comment: Occasional   Drug use: Never    Review of Systems  Constitutional: No fever.  Positive for chills in the evening Eyes: No visual changes. ENT: No sore throat. Cardiovascular: Denies chest pain. Respiratory: Denies shortness of breath. Gastrointestinal: No abdominal pain.  No nausea, no vomiting.  No diarrhea.  No constipation. Genitourinary: Negative for dysuria.  Positive for urinary frequency and new incontinence Musculoskeletal: Positive for chronic lumbar back pain. Skin: Negative for rash. Neurological: Negative for headaches,.  Positive for left-sided weakness  ____________________________________________   PHYSICAL EXAM:  VITAL SIGNS: Vitals:   10/02/20 0400 10/02/20 0532  BP: (!) 164/77 (!) 155/80  Pulse: 66   Resp: 19   Temp:    SpO2: 93%     Constitutional: Alert and oriented. Well appearing and in no acute distress. Eyes: Conjunctivae are normal. PERRL. EOMI. Head: Atraumatic. Nose: No congestion/rhinnorhea. Mouth/Throat: Mucous membranes are moist.  Oropharynx non-erythematous. Neck: No stridor. No cervical spine tenderness to palpation. Cardiovascular: Normal rate, regular rhythm. Grossly normal heart sounds.  Good peripheral circulation. Respiratory: Normal respiratory effort.  No retractions. Lungs CTAB. Gastrointestinal: Soft , nondistended, nontender to palpation. No CVA tenderness. Musculoskeletal: No lower extremity tenderness nor edema.  No joint effusions. No signs of acute trauma. Neurologic: Dysarthria and some word finding difficulties are noted. She does report paresthesias and altered sensation to the left side of her  face.  Otherwise cranial nerves appear to be intact. 4.5/5 strength to left arm.  She falters a little bit while holding this arm up, but is able to keep it in the air.  Left leg with 5/5 strength. right-sided extremities do not exhibit this No dysmetria noted to finger-nose-finger Skin:  Skin is warm, dry and intact. No rash noted. Psychiatric: Mood and affect are normal. Speech and behavior are normal. ____________________________________________   LABS (all labs ordered are listed, but only abnormal results are displayed)  Labs Reviewed  BASIC METABOLIC PANEL -  Abnormal; Notable for the following components:      Result Value   Sodium 134 (*)    Glucose, Bld 225 (*)    All other components within normal limits  CBC - Abnormal; Notable for the following components:   WBC 11.9 (*)    All other components within normal limits  URINALYSIS, COMPLETE (UACMP) WITH MICROSCOPIC - Abnormal; Notable for the following components:   Color, Urine AMBER (*)    APPearance HAZY (*)    Nitrite POSITIVE (*)    Leukocytes,Ua LARGE (*)    Bacteria, UA MANY (*)    All other components within normal limits  URINE CULTURE   ____________________________________________  12 Lead EKG  Sinus rhythm, rate of 78 bpm.  Normal axis and intervals.  No evidence of acute ischemia ____________________________________________  RADIOLOGY  ED MD interpretation:    Official radiology report(s): CT Head Wo Contrast  Result Date: 10/02/2020 CLINICAL DATA:  Left-sided weakness EXAM: CT HEAD WITHOUT CONTRAST TECHNIQUE: Contiguous axial images were obtained from the base of the skull through the vertex without intravenous contrast. COMPARISON:  None. FINDINGS: Brain: No acute intracranial abnormality. Specifically, no hemorrhage, hydrocephalus, mass lesion, acute infarction, or significant intracranial injury. Vascular: No hyperdense vessel or unexpected calcification. Skull: No acute calvarial abnormality.  Sinuses/Orbits: No acute findings Other: None IMPRESSION: No acute intracranial abnormality. Electronically Signed   By: Rolm Baptise M.D.   On: 10/02/2020 02:00   CT Cervical Spine Wo Contrast  Result Date: 10/02/2020 CLINICAL DATA:  Left arm weakness EXAM: CT CERVICAL SPINE WITHOUT CONTRAST TECHNIQUE: Multidetector CT imaging of the cervical spine was performed without intravenous contrast. Multiplanar CT image reconstructions were also generated. COMPARISON:  None. FINDINGS: Alignment: Normal Skull base and vertebrae: No acute fracture. No primary bone lesion or focal pathologic process. Soft tissues and spinal canal: No prevertebral fluid or swelling. No visible canal hematoma. Disc levels: Disc space narrowing and spurring anteriorly. Mild bilateral degenerative facet disease, left greater than right. Upper chest: No acute findings Other: None IMPRESSION: Mild degenerative changes in the cervical spine. No acute bony abnormality. Electronically Signed   By: Rolm Baptise M.D.   On: 10/02/2020 02:02   MR BRAIN WO CONTRAST  Result Date: 10/02/2020 CLINICAL DATA:  Left-sided weakness and dysarthria EXAM: MRI HEAD WITHOUT CONTRAST TECHNIQUE: Multiplanar, multiecho pulse sequences of the brain and surrounding structures were obtained without intravenous contrast. COMPARISON:  Head CT earlier today FINDINGS: Brain: No acute infarction, hemorrhage, hydrocephalus, extra-axial collection or mass lesion. Vascular: Normal flow voids. Skull and upper cervical spine: Normal marrow signal. Sinuses/Orbits: No pathologic finding.  Bilateral cataract resection Other: Intermittent motion IMPRESSION: Negative motion degraded brain MRI. Electronically Signed   By: Monte Fantasia M.D.   On: 10/02/2020 04:07    ____________________________________________   PROCEDURES and INTERVENTIONS  Procedure(s) performed (including Critical Care):  .1-3 Lead EKG Interpretation  Date/Time: 10/02/2020 7:19 AM Performed by:  Vladimir Crofts, MD Authorized by: Vladimir Crofts, MD     Interpretation: normal     ECG rate:  68   ECG rate assessment: normal     Rhythm: sinus rhythm     Ectopy: none     Conduction: normal    Medications  LORazepam (ATIVAN) tablet 0.5 mg (has no administration in time range)  cefTRIAXone (ROCEPHIN) 1 g in sodium chloride 0.9 % 100 mL IVPB (0 g Intravenous Stopped 10/02/20 0451)  LORazepam (ATIVAN) tablet 1 mg (1 mg Oral Given 10/02/20 0236)    ____________________________________________  MDM / ED COURSE   65 year old woman presents to the ED with weakness, urinary frequency and mild left hand weakness, possibly all attributable to acute cystitis, ultimately amenable to outpatient management.  Patient has very mild dysarthria and left hand weakness on examination, otherwise neurologically intact.  No evidence of vascular deficits, trauma or distress.  Blood work with minimal leukocytosis, otherwise unremarkable.  Urinalysis with infectious features, particularly with her symptoms of increased frequency and incontinence.  This was sent for culture and she was provided Rocephin to treat acute cystitis.  CT C-spine demonstrates no evidence of postprocedural complication to contribute to her symptoms from her recent injection, and CT head demonstrates no ICH or signs of CVA.  Due to persistent symptoms, MRI brain obtained and demonstrates no evidence of acute CVA.  She has improving symptoms after some fluids and Rocephin.  She is tolerating p.o. intake and reports feeling better, daughter at the bedside indicates that she seems much better now.  I discussed with him the possibility of TIA or other central nervous pathology contributing to her symptoms, but they report that he seems fine and want to take her home.  Considering how well she looks now, I think this is reasonable.  On discussion the bedside with return precautions and family reports that they will stay with her and keep an eye on her  over the next few days.  We will discharge with a course of Keflex to treat acute cystitis.  Patient stable for outpatient management.  Clinical Course as of 10/02/20 1696  Nancy Fetter Oct 02, 2020  0439 Reassessed.  Patient quite sleepy from the Ativan to get the MRI.  Discussed reassuring imaging with patient and daughter. We discussed having the patient wake up a little bit, p.o. challenge and reassessment.  We discussed that if she is feeling better, there is the possibility of outpatient management.  We discussed need for repeat neurologic exam and reassessment.  All are in agreement. [DS]  [id number] Reassessed.  Patient has drank a full cup of coffee and reports feeling better.  Her neurologic exam is improved, no longer faltering while keeping her left arm up.  She is ambulatory at her baseline with her cane.  Family at the bedside reports that she looks much better and is speaking more normally.  We discussed treatment of UTI with antibiotics.  We discussed the possibility of TIA contributing to her symptoms, and the possibility of worsening central neurologic symptoms.  We discussed return precautions for the ED.  Patient and daughter are eager to go home and report comfort with this. [DS]    Clinical Course User Index [DS] Vladimir Crofts, MD    ____________________________________________   FINAL CLINICAL IMPRESSION(S) / ED DIAGNOSES  Final diagnoses:  Acute cystitis without hematuria  Weakness     ED Discharge Orders          Ordered    cephALEXin (KEFLEX) 500 MG capsule  4 times daily        10/02/20 0533             Oretta Berkland Tamala Julian   Note:  This document was prepared using Dragon voice recognition software and may include unintentional dictation errors.    Vladimir Crofts, MD 10/02/20 [phone]

## 2020-10-04 LAB — URINE CULTURE: Culture: >=100000 — AB

## 2020-10-07 ENCOUNTER — Ambulatory Visit: Payer: Medicare Other | Admitting: Family Medicine

## 2020-10-07 ENCOUNTER — Encounter: Payer: Self-pay | Admitting: Family Medicine

## 2020-10-07 ENCOUNTER — Ambulatory Visit (INDEPENDENT_AMBULATORY_CARE_PROVIDER_SITE_OTHER): Payer: Medicare Other | Admitting: Family Medicine

## 2020-10-07 ENCOUNTER — Other Ambulatory Visit: Payer: Self-pay

## 2020-10-07 VITALS — BP 138/70 | HR 80 | Ht 60.0 in | Wt 145.0 lb

## 2020-10-07 DIAGNOSIS — G459 Transient cerebral ischemic attack, unspecified: Secondary | ICD-10-CM | POA: Diagnosis not present

## 2020-10-07 DIAGNOSIS — E1140 Type 2 diabetes mellitus with diabetic neuropathy, unspecified: Secondary | ICD-10-CM

## 2020-10-07 DIAGNOSIS — N3000 Acute cystitis without hematuria: Secondary | ICD-10-CM

## 2020-10-07 LAB — POCT URINALYSIS DIPSTICK
Bilirubin, UA: NEGATIVE
Blood, UA: NEGATIVE
Glucose, UA: NEGATIVE
Ketones, UA: NEGATIVE
Leukocytes, UA: NEGATIVE
Nitrite, UA: NEGATIVE
Protein, UA: NEGATIVE
Spec Grav, UA: 1.01 (ref 1.010–1.025)
Urobilinogen, UA: 0.2 E.U./dL
pH, UA: 6 (ref 5.0–8.0)

## 2020-10-07 NOTE — Progress Notes (Signed)
Date:  10/07/2020   Name:  Mary Nicholson   DOB:  Jan 08, 1956   MRN:  [id number]   Chief Complaint: Follow-up (ER visit- UTI and possible TIA. Left leg feels weaker than R), but that is "not unusual." Had a neck injection done 2 days before had the symptoms of the TIA. )  Neurologic Problem The patient's primary symptoms include a loss of balance. The patient's pertinent negatives include no altered mental status, clumsiness, focal sensory loss, focal weakness, memory loss, near-syncope, slurred speech, syncope, visual change or weakness. This is a chronic problem. The current episode started more than 1 year ago. The problem is unchanged. There was left-sided and upper extremity focality noted. Pertinent negatives include no abdominal pain, auditory change, back pain, chest pain, confusion, diaphoresis, dizziness, fatigue, fever, headaches, nausea, neck pain, palpitations or shortness of breath. There is no history of liver disease or mood changes.  Diabetes She presents for her follow-up diabetic visit. She has type 2 diabetes mellitus. Her disease course has been stable. Pertinent negatives for hypoglycemia include no confusion, dizziness, headaches, hunger, mood changes, nervousness/anxiousness, pallor, seizures, sleepiness, speech difficulty, sweats or tremors. Pertinent negatives for diabetes include no blurred vision, no chest pain, no fatigue, no foot paresthesias, no foot ulcerations, no polydipsia, no polyphagia, no polyuria, no visual change, no weakness and no weight loss. There are no hypoglycemic complications. Current diabetic treatment includes oral agent (dual therapy). She is following a generally unhealthy diet. Her breakfast blood glucose is taken between 8-9 am. Her breakfast blood glucose range is generally 130-140 mg/dl. An ACE inhibitor/angiotensin II receptor blocker is being taken.  Hypertension Pertinent negatives include no blurred vision, chest pain, headaches, neck  pain, palpitations, shortness of breath or sweats. The current treatment provides moderate improvement. Compliance problems include diet (low sugar ice cream diet).  There is no history of chronic renal disease.  Hyperlipidemia This is a chronic problem. The problem is controlled. Recent lipid tests were reviewed and are normal. Exacerbating diseases include diabetes. She has no history of chronic renal disease, hypothyroidism, liver disease, obesity or nephrotic syndrome. Pertinent negatives include no chest pain, focal sensory loss, focal weakness, myalgias or shortness of breath.   Lab Results  Component Value Date   CREATININE 0.96 10/02/2020   BUN 23 10/02/2020   NA 134 (L) 10/02/2020   K 3.8 10/02/2020   CL 100 10/02/2020   CO2 26 10/02/2020   Lab Results  Component Value Date   CHOL 148 07/22/2020   HDL 47 07/22/2020   LDLCALC 61 07/22/2020   TRIG 251 (H) 07/22/2020   Lab Results  Component Value Date   TSH 1.750 07/22/2020   Lab Results  Component Value Date   HGBA1C 7.7 (H) 07/22/2020   Lab Results  Component Value Date   WBC 11.9 (H) 10/02/2020   HGB 13.2 10/02/2020   HCT 40.2 10/02/2020   MCV 89.5 10/02/2020   PLT 330 10/02/2020   Lab Results  Component Value Date   ALT 21 07/22/2020   AST 16 07/22/2020   ALKPHOS 103 07/22/2020   BILITOT 0.3 07/22/2020     Review of Systems  Constitutional:  Negative for chills, diaphoresis, fatigue, fever and weight loss.  HENT:  Negative for drooling, ear discharge, ear pain and sore throat.   Eyes:  Negative for blurred vision.  Respiratory:  Negative for cough, shortness of breath and wheezing.   Cardiovascular:  Negative for chest pain, palpitations, leg swelling and near-syncope.  Gastrointestinal:  Negative for abdominal pain, blood in stool, constipation, diarrhea and nausea.  Endocrine: Negative for polydipsia, polyphagia and polyuria.  Genitourinary:  Negative for dysuria, frequency, hematuria and urgency.   Musculoskeletal:  Negative for back pain, myalgias and neck pain.  Skin:  Negative for pallor and rash.  Allergic/Immunologic: Negative for environmental allergies.  Neurological:  Positive for loss of balance. Negative for dizziness, tremors, focal weakness, seizures, syncope, speech difficulty, weakness and headaches.  Hematological:  Does not bruise/bleed easily.  Psychiatric/Behavioral:  Negative for confusion, memory loss and suicidal ideas. The patient is not nervous/anxious.    Patient Active Problem List   Diagnosis Date Noted   Bipolar affective disorder, mixed ([place]) 11/26/2019   COPD (chronic obstructive pulmonary disease) ([place]) 11/26/2019   PTSD (post-traumatic stress disorder) 11/26/2019   Schizophrenia ([place]) 11/26/2019   Abdominal wall cellulitis 11/12/2019   Abdominal wall abscess    Type 2 diabetes mellitus with diabetic neuropathy, without long-term current use of insulin (HCC)    Hyperlipidemia    Bilateral hand numbness 09/16/2019   S/P hysterectomy 09/09/2019   Stage 3b chronic kidney disease ([place]) 09/09/2019   Cervical radiculitis 08/13/2019   Chronic bilateral low back pain with right-sided sciatica 08/13/2019   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   Neck pain 08/13/2019   Numbness and tingling of both feet 07/30/2019   Dupuytren contracture 05/22/2019   Personal history of transient ischemic attack (TIA), and cerebral infarction without residual deficits 05/22/2019   Resting tremor 05/22/2019   Diverticulosis 05/20/2019   External hemorrhoid 05/20/2019   Abnormal cervical Papanicolaou smear 12/19/2018   Arthralgia of both hands 12/19/2018   Breast pain, right 12/19/2018   Diarrhea 12/19/2018   Dysphagia 12/19/2018   Paresthesias 12/19/2018   Seasonal allergies 12/19/2018   Gastroesophageal reflux disease 09/23/2015   Spinal stenosis of lumbar region 05/04/2015   Allergic rhinitis 11/28/2014   Acquired hypothyroidism 09/13/2014    Essential hypertension 09/13/2014   Type 2 diabetes mellitus with diabetic neuropathy ([place]) 09/13/2014    No Known Allergies  Past Surgical History:  Procedure Laterality Date   ABDOMINAL HYSTERECTOMY     BACK SURGERY     BREAST BIOPSY Right    negative 1988   CHOLECYSTECTOMY      Social History   Tobacco Use   Smoking status: Every Day    Packs/day: 0.50    Pack years: 0.00    Types: Cigarettes    Last attempt to quit: 10/26/2019    Years since quitting: 0.9   Smokeless tobacco: Never  Substance Use Topics   Alcohol use: Yes    Comment: Occasional   Drug use: Never     Medication list has been reviewed and updated.  Current Meds  Medication Sig   Acetaminophen (TYLENOL ARTHRITIS EXT RELIEF PO) Take 1 capsule by mouth 3 (three) times daily.   albuterol (VENTOLIN HFA) 108 (90 Base) MCG/ACT inhaler Inhale 1-2 puffs into the lungs every 6 (six) hours as needed for wheezing or shortness of breath.   aspirin 81 MG chewable tablet Chew by mouth.   atorvastatin (LIPITOR) 80 MG tablet Take 1 tablet (80 mg total) by mouth daily.   b complex vitamins capsule Take 1 capsule by mouth daily.   Biotin 5 MG CAPS Take 1 capsule by mouth daily.   busPIRone (BUSPAR) 15 MG tablet Take 15 mg by mouth 3 (three) times daily.   Cetirizine HCl (ALLERGY RELIEF) 10 MG CAPS Take 1 capsule by mouth daily.  CREON 24000-76000 units CPEP Take by mouth.   cyclobenzaprine (FLEXERIL) 10 MG tablet Take 10 mg by mouth 3 (three) times daily as needed.   famotidine (PEPCID) 40 MG tablet Take 1 tablet (40 mg total) by mouth daily. otc   fluticasone (FLONASE) 50 MCG/ACT nasal spray Place 2 sprays into both nostrils daily.    Fluticasone-Umeclidin-Vilant (TRELEGY ELLIPTA) 100-62.5-25 MCG/INH AEPB Inhale 1 puff into the lungs daily.   gabapentin (NEURONTIN) 800 MG tablet Take 800 mg by mouth 3 (three) times daily.   glipiZIDE (GLUCOTROL XL) 10 MG 24 hr tablet Take 1 tablet (10 mg total) by mouth daily.    hydrochlorothiazide (HYDRODIURIL) 25 MG tablet Take 1 tablet (25 mg total) by mouth daily.   hydrOXYzine (VISTARIL) 25 MG capsule Take by mouth.   lamoTRIgine (LAMICTAL) 200 MG tablet Take 200 mg by mouth 2 (two) times daily.   levothyroxine (SYNTHROID) 100 MCG tablet Take 1 tablet (100 mcg total) by mouth daily.   meloxicam (MOBIC) 15 MG tablet Take 15 mg by mouth daily.   metFORMIN (GLUCOPHAGE-XR) 500 MG 24 hr tablet Take 2 tablets (1,000 mg total) by mouth 2 (two) times daily.   metoprolol succinate (TOPROL-XL) 50 MG 24 hr tablet Take 1 tablet (50 mg total) by mouth daily.   Multiple Vitamins-Minerals (CENTRUM SILVER PO) Take 1 each by mouth daily.   Multiple Vitamins-Minerals (PRESERVISION AREDS) CAPS Take 1 capsule by mouth 2 (two) times daily.   mupirocin ointment (BACTROBAN) 2 % Apply 1 application topically 2 (two) times daily. For tick bite   ondansetron (ZOFRAN) 4 MG tablet Take 1 tablet (4 mg total) by mouth every 8 (eight) hours as needed for up to 10 doses for nausea or vomiting.   OneTouch Delica Lancets 74Y MISC Apply 1 each topically 3 (three) times daily.   ONETOUCH ULTRA test strip 3 (three) times daily.   traZODone (DESYREL) 50 MG tablet Take 50 mg by mouth at bedtime as needed.   TRELEGY ELLIPTA 100-62.5-25 MCG/INH AEPB Inhale 1 puff into the lungs daily.   venlafaxine XR (EFFEXOR-XR) 75 MG 24 hr capsule Take 150 mg by mouth in the morning and at bedtime.     PHQ 2/9 Scores 10/07/2020 07/14/2020  PHQ - 2 Score 1 1  PHQ- 9 Score 2 5    GAD 7 : Generalized Anxiety Score 10/07/2020 07/14/2020  Nervous, Anxious, on Edge 1 2  Control/stop worrying 0 3  Worry too much - different things 0 3  Trouble relaxing 0 2  Restless 0 1  Easily annoyed or irritable 0 2  Afraid - awful might happen 0 0  Total GAD 7 Score 1 13  Anxiety Difficulty Not difficult at all Very difficult    BP Readings from Last 3 Encounters:  10/07/20 138/70  10/02/20 (!) 155/80  07/27/20 (!) 143/78     Physical Exam Vitals and nursing note reviewed.  Constitutional:      General: She is not in acute distress.    Appearance: She is not diaphoretic.  HENT:     Head: Normocephalic and atraumatic.     Right Ear: External ear normal.     Left Ear: External ear normal.     Nose: Nose normal.  Eyes:     General:        Right eye: No discharge.        Left eye: No discharge.     Conjunctiva/sclera: Conjunctivae normal.     Pupils: Pupils are equal, round,  and reactive to light.  Neck:     Thyroid: No thyromegaly.     Vascular: No JVD.  Cardiovascular:     Rate and Rhythm: Normal rate and regular rhythm.     Heart sounds: Normal heart sounds. No murmur heard.   No friction rub. No gallop.  Pulmonary:     Effort: Pulmonary effort is normal.     Breath sounds: Normal breath sounds.  Abdominal:     General: Bowel sounds are normal.     Palpations: Abdomen is soft. There is no mass.     Tenderness: There is no abdominal tenderness. There is no guarding.  Musculoskeletal:        General: Normal range of motion.     Cervical back: Normal range of motion and neck supple.  Lymphadenopathy:     Cervical: No cervical adenopathy.  Skin:    General: Skin is warm and dry.  Neurological:     Mental Status: She is alert.     Deep Tendon Reflexes: Reflexes are normal and symmetric.    Wt Readings from Last 3 Encounters:  10/07/20 145 lb (65.8 kg)  10/01/20 142 lb (64.4 kg)  07/27/20 143 lb 6.4 oz (65 kg)    BP 138/70   Pulse 80   Ht 5' (1.524 m)   Wt 145 lb (65.8 kg)   BMI 28.32 kg/m   Assessment and Plan:  1. Acute cystitis without hematuria Acute.  Improved.  Stable.  This has resolved with IV Rocephin followed by cephalexin p.o.  Review of patient's sensitivities notes that there is sensitive to cephalosporins at which she has completed.  Dipstick today notes no residual infection. - POCT urinalysis dipstick  2. Type 2 diabetes mellitus with diabetic neuropathy, without  long-term current use of insulin (HCC) Chronic.  Almost controlled.  A1c is in the mid sevens.  Currently is on a metformin XR regimen of 8000 XR twice a day as well as maxed out on Glucotrol XR once a day.  Patient however is not watching her glucose intake as closely as she should with her diet.  Dietary sheets from the [facility]/glycemic sheets have been given for patient to use for more judicial selection of her diet at home in the meantime we will place a referral to endocrinology because I am anticipating that we may need to go ANA GLP-1/SGLT 2 direction. - Ambulatory referral to Endocrinology  3. TIA (transient ischemic attack) New onset.  Patient presented to the emergency room with weakness in her left arm which may have been due to a recent injection to her cervical disc.  However she also had dysarthria which sounds more of an expressive aphasia which would suggest that there was more of a supratentorial concern.  Review of MRI and CT was unremarkable but I am fairly certain given her risk factors smoking hypertension hyperlipidemia diabetes age that there is some progression of her her cerebrovascular disease.  Currently this is stable and there has been improvement but we will refer to neurology for further evaluation in the meantime patient is continue low-dose aspirin. - Ambulatory referral to Neurology   Patient has been advised of the health risks of smoking and counseled concerning cessation of tobacco products. I spent over 3 minutes for discussion and to answer questions.  Patient inquired about Chantix but this is not available at this time as I understand.  I have suggested NicoDerm patches as a possible alternative.

## 2020-10-07 NOTE — Patient Instructions (Signed)

## 2020-10-20 DIAGNOSIS — M5412 Radiculopathy, cervical region: Secondary | ICD-10-CM | POA: Diagnosis not present

## 2020-10-20 DIAGNOSIS — M9931 Osseous stenosis of neural canal of cervical region: Secondary | ICD-10-CM | POA: Diagnosis not present

## 2020-10-20 DIAGNOSIS — G5603 Carpal tunnel syndrome, bilateral upper limbs: Secondary | ICD-10-CM | POA: Diagnosis not present

## 2020-10-20 DIAGNOSIS — M5441 Lumbago with sciatica, right side: Secondary | ICD-10-CM | POA: Diagnosis not present

## 2020-10-20 DIAGNOSIS — M48062 Spinal stenosis, lumbar region with neurogenic claudication: Secondary | ICD-10-CM | POA: Diagnosis not present

## 2020-10-20 DIAGNOSIS — R200 Anesthesia of skin: Secondary | ICD-10-CM | POA: Diagnosis not present

## 2020-10-20 DIAGNOSIS — M542 Cervicalgia: Secondary | ICD-10-CM | POA: Diagnosis not present

## 2020-10-20 DIAGNOSIS — G8929 Other chronic pain: Secondary | ICD-10-CM | POA: Diagnosis not present

## 2020-10-20 DIAGNOSIS — M25562 Pain in left knee: Secondary | ICD-10-CM | POA: Diagnosis not present

## 2020-10-28 ENCOUNTER — Other Ambulatory Visit: Payer: Self-pay | Admitting: Family Medicine

## 2020-10-28 DIAGNOSIS — Z1231 Encounter for screening mammogram for malignant neoplasm of breast: Secondary | ICD-10-CM

## 2020-11-03 DIAGNOSIS — M25562 Pain in left knee: Secondary | ICD-10-CM | POA: Diagnosis not present

## 2020-11-03 DIAGNOSIS — M1712 Unilateral primary osteoarthritis, left knee: Secondary | ICD-10-CM | POA: Diagnosis not present

## 2020-11-11 ENCOUNTER — Ambulatory Visit: Payer: Medicare Other | Admitting: Family Medicine

## 2020-11-11 ENCOUNTER — Other Ambulatory Visit: Payer: Self-pay

## 2020-11-11 ENCOUNTER — Ambulatory Visit
Admission: RE | Admit: 2020-11-11 | Discharge: 2020-11-11 | Disposition: A | Discharge status: Home / Self Care | Payer: Medicare Other | Source: Ambulatory Visit | Attending: Family Medicine | Admitting: Family Medicine

## 2020-11-11 DIAGNOSIS — Z1231 Encounter for screening mammogram for malignant neoplasm of breast: Secondary | ICD-10-CM | POA: Diagnosis not present

## 2020-11-15 NOTE — Progress Notes (Signed)
Pt viewed labs on Mychart.  KP 

## 2020-11-16 DIAGNOSIS — M47816 Spondylosis without myelopathy or radiculopathy, lumbar region: Secondary | ICD-10-CM | POA: Diagnosis not present

## 2020-11-21 DIAGNOSIS — F17210 Nicotine dependence, cigarettes, uncomplicated: Secondary | ICD-10-CM | POA: Diagnosis not present

## 2020-11-28 ENCOUNTER — Other Ambulatory Visit: Payer: Self-pay

## 2020-11-28 ENCOUNTER — Ambulatory Visit (INDEPENDENT_AMBULATORY_CARE_PROVIDER_SITE_OTHER): Payer: Medicare Other | Admitting: Family Medicine

## 2020-11-28 ENCOUNTER — Encounter: Payer: Self-pay | Admitting: Family Medicine

## 2020-11-28 VITALS — BP 137/84 | HR 72 | Ht 60.0 in | Wt 144.0 lb

## 2020-11-28 DIAGNOSIS — I10 Essential (primary) hypertension: Secondary | ICD-10-CM | POA: Diagnosis not present

## 2020-11-28 DIAGNOSIS — E1140 Type 2 diabetes mellitus with diabetic neuropathy, unspecified: Secondary | ICD-10-CM

## 2020-11-28 DIAGNOSIS — E039 Hypothyroidism, unspecified: Secondary | ICD-10-CM

## 2020-11-28 DIAGNOSIS — E7801 Familial hypercholesterolemia: Secondary | ICD-10-CM

## 2020-11-28 DIAGNOSIS — Z780 Asymptomatic menopausal state: Secondary | ICD-10-CM

## 2020-11-28 MED ORDER — METFORMIN HCL ER 500 MG PO TB24: 1000.0000 mg | ORAL_TABLET | Freq: Two times a day (BID) | ORAL | 1 refills | Status: DC

## 2020-11-28 MED ORDER — GLIPIZIDE ER 10 MG PO TB24: 10.0000 mg | ORAL_TABLET | Freq: Every day | ORAL | 1 refills | Status: DC

## 2020-11-28 MED ORDER — HYDROCHLOROTHIAZIDE 25 MG PO TABS: 25.0000 mg | ORAL_TABLET | Freq: Every day | ORAL | 1 refills | Status: DC

## 2020-11-28 MED ORDER — FLUTICASONE PROPIONATE 50 MCG/ACT NA SUSP: 2.0000 | Freq: Every day | NASAL | 0 refills | Status: DC

## 2020-11-28 MED ORDER — LEVOTHYROXINE SODIUM 100 MCG PO TABS: 100.0000 ug | ORAL_TABLET | Freq: Every day | ORAL | 1 refills | Status: DC

## 2020-11-28 MED ORDER — ATORVASTATIN CALCIUM 80 MG PO TABS: 80.0000 mg | ORAL_TABLET | Freq: Every day | ORAL | 1 refills | Status: DC

## 2020-11-28 MED ORDER — METOPROLOL SUCCINATE ER 50 MG PO TB24: 50.0000 mg | ORAL_TABLET | Freq: Every day | ORAL | 1 refills | Status: DC

## 2020-11-28 NOTE — Progress Notes (Signed)
Date:  11/28/2020   Name:  Mary Nicholson   DOB:  September 23, 1955   MRN:  [id number]   Chief Complaint: Hyperlipidemia, Hypertension, Diabetes, Allergic Rhinitis , and Hypothyroidism  Hyperlipidemia This is a chronic problem. The current episode started more than 1 year ago. The problem is controlled. Recent lipid tests were reviewed and are normal. Exacerbating diseases include diabetes and hypothyroidism. She has no history of chronic renal disease or liver disease. Pertinent negatives include no chest pain, leg pain, myalgias or shortness of breath. Current antihyperlipidemic treatment includes statins. The current treatment provides moderate improvement of lipids. There are no compliance problems.  Risk factors for coronary artery disease include dyslipidemia and hypertension.  Hypertension This is a chronic problem. The current episode started more than 1 year ago. Pertinent negatives include no anxiety, blurred vision, chest pain, headaches, malaise/fatigue, neck pain, palpitations, peripheral edema, shortness of breath or sweats. Risk factors for coronary artery disease include diabetes mellitus and smoking/tobacco exposure. Past treatments include beta blockers and diuretics. The current treatment provides moderate improvement. There are no compliance problems.  There is no history of angina, CVA or PVD. Identifiable causes of hypertension include a thyroid problem. There is no history of chronic renal disease, a hypertension causing med or renovascular disease.  Diabetes She presents for her follow-up diabetic visit. She has type 2 diabetes mellitus. The initial diagnosis of diabetes was made 9 years ago. Her disease course has been stable. Pertinent negatives for hypoglycemia include no confusion, dizziness, headaches, hunger, mood changes, nervousness/anxiousness, pallor, seizures, sleepiness, speech difficulty, sweats or tremors. Pertinent negatives for diabetes include no blurred  vision, no chest pain, no fatigue, no foot ulcerations, no polydipsia, no polyphagia, no polyuria, no visual change and no weight loss. There are no hypoglycemic complications. Symptoms are stable. Pertinent negatives for diabetic complications include no CVA, nephropathy or PVD. Risk factors for coronary artery disease include diabetes mellitus, dyslipidemia and tobacco exposure. Current diabetic treatment includes oral agent (dual therapy) (metformen/glypizide). She is following a diabetic, low salt and low fat/cholesterol diet. Her breakfast blood glucose is taken between 6-7 am. Her breakfast blood glucose range is generally 130-140 mg/dl. Her bedtime blood glucose is taken between 8-9 pm. Her bedtime blood glucose range is generally 140-180 mg/dl. An ACE inhibitor/angiotensin II receptor blocker is not being taken.  Thyroid Problem Presents for follow-up visit. Patient reports no anxiety, cold intolerance, constipation, depressed mood, diarrhea, fatigue, hair loss, palpitations, tremors, visual change, weight gain or weight loss. Her past medical history is significant for diabetes and hyperlipidemia.  URI  Chronicity: for allergic rhinitis. Pertinent negatives include no abdominal pain, chest pain, coughing, diarrhea, dysuria, ear pain, headaches, nausea, neck pain, rash, sore throat or wheezing.   Lab Results  Component Value Date   CREATININE 0.96 10/02/2020   BUN 23 10/02/2020   NA 134 (L) 10/02/2020   K 3.8 10/02/2020   CL 100 10/02/2020   CO2 26 10/02/2020   Lab Results  Component Value Date   CHOL 148 07/22/2020   HDL 47 07/22/2020   LDLCALC 61 07/22/2020   TRIG 251 (H) 07/22/2020   Lab Results  Component Value Date   TSH 1.750 07/22/2020   Lab Results  Component Value Date   HGBA1C 7.7 (H) 07/22/2020   Lab Results  Component Value Date   WBC 11.9 (H) 10/02/2020   HGB 13.2 10/02/2020   HCT 40.2 10/02/2020   MCV 89.5 10/02/2020   PLT 330 10/02/2020  Lab Results   Component Value Date   ALT 21 07/22/2020   AST 16 07/22/2020   ALKPHOS 103 07/22/2020   BILITOT 0.3 07/22/2020     Review of Systems  Constitutional:  Negative for chills, fatigue, fever, malaise/fatigue, weight gain and weight loss.  HENT:  Negative for drooling, ear discharge, ear pain and sore throat.   Eyes:  Negative for blurred vision.  Respiratory:  Negative for cough, shortness of breath and wheezing.   Cardiovascular:  Negative for chest pain, palpitations and leg swelling.  Gastrointestinal:  Negative for abdominal pain, blood in stool, constipation, diarrhea and nausea.  Endocrine: Negative for cold intolerance, polydipsia, polyphagia and polyuria.  Genitourinary:  Negative for dysuria, frequency, hematuria and urgency.  Musculoskeletal:  Negative for back pain, myalgias and neck pain.  Skin:  Negative for pallor and rash.  Allergic/Immunologic: Negative for environmental allergies.  Neurological:  Negative for dizziness, tremors, seizures, speech difficulty and headaches.  Hematological:  Does not bruise/bleed easily.  Psychiatric/Behavioral:  Negative for confusion and suicidal ideas. The patient is not nervous/anxious.    Patient Active Problem List   Diagnosis Date Noted   Bipolar affective disorder, mixed ([place]) 11/26/2019   COPD (chronic obstructive pulmonary disease) ([place]) 11/26/2019   PTSD (post-traumatic stress disorder) 11/26/2019   Schizophrenia ([employer]) 11/26/2019   Abdominal wall cellulitis 11/12/2019   Abdominal wall abscess    Type 2 diabetes mellitus with diabetic neuropathy, without long-term current use of insulin ([facility])    Hyperlipidemia    Bilateral hand numbness 09/16/2019   S/P hysterectomy 09/09/2019   Stage 3b chronic kidney disease ([place]) 09/09/2019   Cervical radiculitis 08/13/2019   Chronic bilateral low back pain with right-sided sciatica 08/13/2019   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   Neck pain 08/13/2019   Numbness and  tingling of both feet 07/30/2019   Dupuytren contracture 05/22/2019   Personal history of transient ischemic attack (TIA), and cerebral infarction without residual deficits 05/22/2019   Resting tremor 05/22/2019   Diverticulosis 05/20/2019   External hemorrhoid 05/20/2019   Abnormal cervical Papanicolaou smear 12/19/2018   Arthralgia of both hands 12/19/2018   Breast pain, right 12/19/2018   Diarrhea 12/19/2018   Dysphagia 12/19/2018   Paresthesias 12/19/2018   Seasonal allergies 12/19/2018   Gastroesophageal reflux disease 09/23/2015   Spinal stenosis of lumbar region 05/04/2015   Allergic rhinitis 11/28/2014   Acquired hypothyroidism 09/13/2014   Essential hypertension 09/13/2014   Type 2 diabetes mellitus with diabetic neuropathy ([place]) 09/13/2014    No Known Allergies  Past Surgical History:  Procedure Laterality Date   ABDOMINAL HYSTERECTOMY     BACK SURGERY     BREAST BIOPSY Right    negative 1988   CHOLECYSTECTOMY      Social History   Tobacco Use   Smoking status: Every Day    Packs/day: 0.50    Types: Cigarettes    Last attempt to quit: 10/26/2019    Years since quitting: 1.0   Smokeless tobacco: Never  Substance Use Topics   Alcohol use: Yes    Comment: Occasional   Drug use: Never     Medication list has been reviewed and updated.  Current Meds  Medication Sig   Acetaminophen (TYLENOL ARTHRITIS EXT RELIEF PO) Take 1 capsule by mouth 3 (three) times daily.   albuterol (VENTOLIN HFA) 108 (90 Base) MCG/ACT inhaler Inhale 1-2 puffs into the lungs every 6 (six) hours as needed for wheezing or shortness of breath.   aspirin 81  MG chewable tablet Chew by mouth.   atorvastatin (LIPITOR) 80 MG tablet Take 1 tablet (80 mg total) by mouth daily.   b complex vitamins capsule Take 1 capsule by mouth daily.   Biotin 5 MG CAPS Take 1 capsule by mouth daily.   busPIRone (BUSPAR) 15 MG tablet Take 15 mg by mouth 3 (three) times daily.   Cetirizine HCl (ALLERGY  RELIEF) 10 MG CAPS Take 1 capsule by mouth daily.   cyclobenzaprine (FLEXERIL) 10 MG tablet Take 10 mg by mouth 3 (three) times daily as needed.   famotidine (PEPCID) 40 MG tablet Take 1 tablet (40 mg total) by mouth daily. otc   fluticasone (FLONASE) 50 MCG/ACT nasal spray Place 2 sprays into both nostrils daily.    Fluticasone-Umeclidin-Vilant (TRELEGY ELLIPTA) 100-62.5-25 MCG/INH AEPB Inhale 1 puff into the lungs daily.   gabapentin (NEURONTIN) 800 MG tablet Take 800 mg by mouth 3 (three) times daily. neuro   glipiZIDE (GLUCOTROL XL) 10 MG 24 hr tablet Take 1 tablet (10 mg total) by mouth daily.   hydrochlorothiazide (HYDRODIURIL) 25 MG tablet Take 1 tablet (25 mg total) by mouth daily.   hydrOXYzine (VISTARIL) 25 MG capsule Take by mouth.   lamoTRIgine (LAMICTAL) 200 MG tablet Take 200 mg by mouth 2 (two) times daily.   levothyroxine (SYNTHROID) 100 MCG tablet Take 1 tablet (100 mcg total) by mouth daily.   meloxicam (MOBIC) 15 MG tablet Take 15 mg by mouth daily.   metFORMIN (GLUCOPHAGE-XR) 500 MG 24 hr tablet Take 2 tablets (1,000 mg total) by mouth 2 (two) times daily.   metoprolol succinate (TOPROL-XL) 50 MG 24 hr tablet Take 1 tablet (50 mg total) by mouth daily.   Multiple Vitamins-Minerals (CENTRUM SILVER PO) Take 1 each by mouth daily.   Multiple Vitamins-Minerals (PRESERVISION AREDS) CAPS Take 1 capsule by mouth 2 (two) times daily.   mupirocin ointment (BACTROBAN) 2 % Apply 1 application topically 2 (two) times daily. For tick bite   ondansetron (ZOFRAN) 4 MG tablet Take 1 tablet (4 mg total) by mouth every 8 (eight) hours as needed for up to 10 doses for nausea or vomiting.   OneTouch Delica Lancets 99991111 MISC Apply 1 each topically 3 (three) times daily.   ONETOUCH ULTRA test strip 3 (three) times daily.   SM IRON 325 (65 Fe) MG tablet Take by mouth.   traZODone (DESYREL) 50 MG tablet Take 50 mg by mouth at bedtime as needed.   TRELEGY ELLIPTA 100-62.5-25 MCG/INH AEPB Inhale 1  puff into the lungs daily.   venlafaxine XR (EFFEXOR-XR) 75 MG 24 hr capsule Take 150 mg by mouth in the morning and at bedtime.    [DISCONTINUED] CREON 24000-76000 units CPEP Take by mouth.    PHQ 2/9 Scores 10/07/2020 07/14/2020  PHQ - 2 Score 1 1  PHQ- 9 Score 2 5    GAD 7 : Generalized Anxiety Score 10/07/2020 07/14/2020  Nervous, Anxious, on Edge 1 2  Control/stop worrying 0 3  Worry too much - different things 0 3  Trouble relaxing 0 2  Restless 0 1  Easily annoyed or irritable 0 2  Afraid - awful might happen 0 0  Total GAD 7 Score 1 13  Anxiety Difficulty Not difficult at all Very difficult    BP Readings from Last 3 Encounters:  11/28/20 (!) 144/80  10/07/20 138/70  10/02/20 (!) 155/80    Physical Exam Vitals and nursing note reviewed.  Constitutional:      General: She  is not in acute distress.    Appearance: She is not diaphoretic.  HENT:     Head: Normocephalic and atraumatic.     Right Ear: Tympanic membrane and external ear normal.     Left Ear: Tympanic membrane and external ear normal.     Nose: Nose normal.  Eyes:     General:        Right eye: No discharge.        Left eye: No discharge.     Conjunctiva/sclera: Conjunctivae normal.     Pupils: Pupils are equal, round, and reactive to light.  Neck:     Thyroid: No thyromegaly.     Vascular: No JVD.  Cardiovascular:     Rate and Rhythm: Normal rate and regular rhythm.     Heart sounds: Normal heart sounds, S1 normal and S2 normal. No murmur heard. No systolic murmur is present.  No diastolic murmur is present.    No friction rub. No gallop. No S3 or S4 sounds.  Pulmonary:     Effort: Pulmonary effort is normal.     Breath sounds: Normal breath sounds.  Abdominal:     General: Bowel sounds are normal.     Palpations: Abdomen is soft. There is no mass.     Tenderness: There is no abdominal tenderness. There is no guarding.  Musculoskeletal:        General: Normal range of motion.     Cervical  back: Normal range of motion and neck supple.  Lymphadenopathy:     Cervical: No cervical adenopathy.  Skin:    General: Skin is warm and dry.  Neurological:     Mental Status: She is alert.     Deep Tendon Reflexes: Reflexes are normal and symmetric.    Wt Readings from Last 3 Encounters:  11/28/20 144 lb (65.3 kg)  10/07/20 145 lb (65.8 kg)  10/01/20 142 lb (64.4 kg)    BP (!) 144/80   Pulse 72   Ht 5' (1.524 m)   Wt 144 lb (65.3 kg)   BMI 28.12 kg/m   Assessment and Plan:  1. Type 2 diabetes mellitus with diabetic neuropathy, without long-term current use of insulin ([place]) .  Controlled.  Stable.  Currently on glipizide XL 10 mg once a day and metformin 500 mg 2 tablets twice a day.  Will check A1c for current status. - HgB A1c - glipiZIDE (GLUCOTROL XL) 10 MG 24 hr tablet; Take 1 tablet (10 mg total) by mouth daily.  Dispense: 90 tablet; Refill: 1 - metFORMIN (GLUCOPHAGE-XR) 500 MG 24 hr tablet; Take 2 tablets (1,000 mg total) by mouth 2 (two) times daily.  Dispense: 360 tablet; Refill: 1  2. Essential hypertension Chronic.  Controlled.  Stable.  Blood pressure is 137/84.  Continue metoprolol XL 50 mg once a day and hydrochlorothiazide 25 mg once a day. - hydrochlorothiazide (HYDRODIURIL) 25 MG tablet; Take 1 tablet (25 mg total) by mouth daily.  Dispense: 90 tablet; Refill: 1 - metoprolol succinate (TOPROL-XL) 50 MG 24 hr tablet; Take 1 tablet (50 mg total) by mouth daily.  Dispense: 90 tablet; Refill: 1  3. Familial hypercholesterolemia Chronic.  Controlled.  Stable.  Continue atorvastatin 80 mg once a day. - atorvastatin (LIPITOR) 80 MG tablet; Take 1 tablet (80 mg total) by mouth daily.  Dispense: 90 tablet; Refill: 1  4. Acquired hypothyroidism .  Controlled.  Stable.  Continue with thyroxine 100 mcg daily. - levothyroxine (SYNTHROID) 100 MCG tablet; Take 1  tablet (100 mcg total) by mouth daily.  Dispense: 90 tablet; Refill: 1  5. Menopause And is menopause and we  will check bone density. - DG Bone Density; Future

## 2020-11-29 ENCOUNTER — Other Ambulatory Visit: Payer: Self-pay

## 2020-11-29 DIAGNOSIS — E1140 Type 2 diabetes mellitus with diabetic neuropathy, unspecified: Secondary | ICD-10-CM

## 2020-11-29 LAB — HEMOGLOBIN A1C
Est. average glucose Bld gHb Est-mCnc: 171 mg/dL
Hgb A1c MFr Bld: 7.6 % — ABNORMAL HIGH (ref 4.8–5.6)

## 2020-11-29 NOTE — Progress Notes (Signed)
Put in referral for endo

## 2020-12-19 DIAGNOSIS — R0683 Snoring: Secondary | ICD-10-CM | POA: Diagnosis not present

## 2020-12-19 DIAGNOSIS — I6930 Unspecified sequelae of cerebral infarction: Secondary | ICD-10-CM | POA: Diagnosis not present

## 2020-12-19 DIAGNOSIS — M539 Dorsopathy, unspecified: Secondary | ICD-10-CM | POA: Diagnosis not present

## 2020-12-19 DIAGNOSIS — E1140 Type 2 diabetes mellitus with diabetic neuropathy, unspecified: Secondary | ICD-10-CM | POA: Diagnosis not present

## 2020-12-20 ENCOUNTER — Other Ambulatory Visit: Payer: Self-pay | Admitting: Family Medicine

## 2020-12-22 ENCOUNTER — Inpatient Hospital Stay: Admission: RE | Admit: 2020-12-22 | Payer: Medicare Other | Source: Ambulatory Visit

## 2020-12-22 ENCOUNTER — Other Ambulatory Visit: Payer: Medicare Other

## 2020-12-24 ENCOUNTER — Telehealth: Payer: Self-pay | Admitting: Family Medicine

## 2020-12-24 NOTE — Telephone Encounter (Signed)
Copied from [place] [id number]. Topic: Medicare AWV >> Dec 24, 2020 11:20 AM Cher Nakai R wrote: Reason for CRM:  Left message for patient to call back and schedule Medicare Annual Wellness Visit (AWV) in office.   If unable to come into the office for AWV,  please offer to do virtually or by telephone.  Last AWV: 01/22/2016 [facility] [place]  Please schedule at anytime with [facility] Health Advisor.  40 minute appointment  Any questions, please contact me at [phone]

## 2020-12-27 ENCOUNTER — Ambulatory Visit: Payer: Self-pay | Admitting: Family Medicine

## 2021-01-02 DIAGNOSIS — R200 Anesthesia of skin: Secondary | ICD-10-CM | POA: Diagnosis not present

## 2021-01-02 DIAGNOSIS — R202 Paresthesia of skin: Secondary | ICD-10-CM | POA: Diagnosis not present

## 2021-01-02 DIAGNOSIS — G5602 Carpal tunnel syndrome, left upper limb: Secondary | ICD-10-CM | POA: Diagnosis not present

## 2021-01-02 DIAGNOSIS — G5601 Carpal tunnel syndrome, right upper limb: Secondary | ICD-10-CM | POA: Diagnosis not present

## 2021-01-02 DIAGNOSIS — G5603 Carpal tunnel syndrome, bilateral upper limbs: Secondary | ICD-10-CM | POA: Diagnosis not present

## 2021-01-09 ENCOUNTER — Other Ambulatory Visit: Payer: Self-pay

## 2021-01-09 ENCOUNTER — Ambulatory Visit
Admission: RE | Admit: 2021-01-09 | Discharge: 2021-01-09 | Disposition: A | Discharge status: Home / Self Care | Payer: Medicare Other | Source: Ambulatory Visit | Attending: Family Medicine | Admitting: Family Medicine

## 2021-01-09 DIAGNOSIS — Z780 Asymptomatic menopausal state: Secondary | ICD-10-CM | POA: Insufficient documentation

## 2021-01-21 ENCOUNTER — Other Ambulatory Visit: Payer: Self-pay | Admitting: Family Medicine

## 2021-01-21 DIAGNOSIS — R1319 Other dysphagia: Secondary | ICD-10-CM

## 2021-01-21 DIAGNOSIS — K219 Gastro-esophageal reflux disease without esophagitis: Secondary | ICD-10-CM

## 2021-01-23 ENCOUNTER — Telehealth: Payer: Self-pay | Admitting: Family Medicine

## 2021-01-23 NOTE — Telephone Encounter (Signed)
Copied from [place] [id number]. Topic: Medicare AWV >> Jan 23, 2021  1:57 PM Cher Nakai R wrote: Reason for CRM:  Phone answers then hangs up unable to leave a message for patient to call back and schedule Medicare Annual Wellness Visit (AWV) in office.   If unable to come into the office for AWV,  please offer to do virtually or by telephone.  Last AWV: 01/22/2016 [facility] [place]  Please schedule at anytime with [facility] Health Advisor.  40 minute appointment  Any questions, please contact me at [phone]

## 2021-01-24 ENCOUNTER — Other Ambulatory Visit: Admission: RE | Admit: 2021-01-24 | Payer: Medicare Other | Source: Ambulatory Visit

## 2021-01-25 ENCOUNTER — Other Ambulatory Visit: Payer: Self-pay

## 2021-01-25 ENCOUNTER — Other Ambulatory Visit
Admission: RE | Admit: 2021-01-25 | Discharge: 2021-01-25 | Disposition: A | Discharge status: Home / Self Care | Payer: Medicare Other | Source: Ambulatory Visit | Attending: Neurology | Admitting: Neurology

## 2021-01-25 DIAGNOSIS — Z20822 Contact with and (suspected) exposure to covid-19: Secondary | ICD-10-CM | POA: Insufficient documentation

## 2021-01-25 DIAGNOSIS — Z01812 Encounter for preprocedural laboratory examination: Secondary | ICD-10-CM | POA: Insufficient documentation

## 2021-01-26 DIAGNOSIS — M5416 Radiculopathy, lumbar region: Secondary | ICD-10-CM | POA: Diagnosis not present

## 2021-01-26 DIAGNOSIS — M545 Low back pain, unspecified: Secondary | ICD-10-CM | POA: Diagnosis not present

## 2021-01-26 LAB — SARS CORONAVIRUS 2 (TAT 6-24 HRS): SARS Coronavirus 2: NEGATIVE

## 2021-02-07 ENCOUNTER — Encounter: Payer: Self-pay | Admitting: General Surgery

## 2021-02-23 DIAGNOSIS — M48062 Spinal stenosis, lumbar region with neurogenic claudication: Secondary | ICD-10-CM | POA: Diagnosis not present

## 2021-02-23 DIAGNOSIS — M5441 Lumbago with sciatica, right side: Secondary | ICD-10-CM | POA: Diagnosis not present

## 2021-02-23 DIAGNOSIS — M5412 Radiculopathy, cervical region: Secondary | ICD-10-CM | POA: Diagnosis not present

## 2021-02-23 DIAGNOSIS — G8929 Other chronic pain: Secondary | ICD-10-CM | POA: Diagnosis not present

## 2021-02-23 DIAGNOSIS — M542 Cervicalgia: Secondary | ICD-10-CM | POA: Diagnosis not present

## 2021-03-09 DIAGNOSIS — M5136 Other intervertebral disc degeneration, lumbar region: Secondary | ICD-10-CM | POA: Diagnosis not present

## 2021-03-09 DIAGNOSIS — M5416 Radiculopathy, lumbar region: Secondary | ICD-10-CM | POA: Diagnosis not present

## 2021-03-09 DIAGNOSIS — Z981 Arthrodesis status: Secondary | ICD-10-CM | POA: Diagnosis not present

## 2021-03-10 ENCOUNTER — Other Ambulatory Visit: Payer: Self-pay | Admitting: Neurosurgery

## 2021-03-10 DIAGNOSIS — M5136 Other intervertebral disc degeneration, lumbar region: Secondary | ICD-10-CM

## 2021-03-10 DIAGNOSIS — M2578 Osteophyte, vertebrae: Secondary | ICD-10-CM | POA: Diagnosis not present

## 2021-03-10 DIAGNOSIS — M5416 Radiculopathy, lumbar region: Secondary | ICD-10-CM | POA: Diagnosis not present

## 2021-03-10 DIAGNOSIS — Z981 Arthrodesis status: Secondary | ICD-10-CM

## 2021-03-30 ENCOUNTER — Ambulatory Visit: Payer: Self-pay | Admitting: Family Medicine

## 2021-03-31 ENCOUNTER — Ambulatory Visit
Admission: RE | Admit: 2021-03-31 | Discharge: 2021-03-31 | Disposition: A | Discharge status: Home / Self Care | Payer: Medicare Other | Source: Ambulatory Visit | Attending: Neurosurgery | Admitting: Neurosurgery

## 2021-03-31 ENCOUNTER — Other Ambulatory Visit: Payer: Self-pay

## 2021-03-31 DIAGNOSIS — Z981 Arthrodesis status: Secondary | ICD-10-CM | POA: Diagnosis not present

## 2021-03-31 DIAGNOSIS — M5136 Other intervertebral disc degeneration, lumbar region: Secondary | ICD-10-CM | POA: Diagnosis not present

## 2021-03-31 DIAGNOSIS — M5416 Radiculopathy, lumbar region: Secondary | ICD-10-CM | POA: Diagnosis not present

## 2021-03-31 DIAGNOSIS — M2578 Osteophyte, vertebrae: Secondary | ICD-10-CM | POA: Diagnosis not present

## 2021-03-31 DIAGNOSIS — M5126 Other intervertebral disc displacement, lumbar region: Secondary | ICD-10-CM | POA: Diagnosis not present

## 2021-03-31 MED ORDER — LIDOCAINE HCL (PF) 1 % IJ SOLN
10.0000 mL | Freq: Once | INTRAMUSCULAR | Status: AC
  Administered 2021-03-31: 5 mL
  Filled 2021-03-31: qty 10

## 2021-03-31 MED ORDER — IOHEXOL 180 MG/ML  SOLN
20.0000 mL | Freq: Once | INTRAMUSCULAR | Status: AC | PRN
  Administered 2021-03-31: 18 mL via INTRATHECAL

## 2021-04-14 ENCOUNTER — Other Ambulatory Visit: Payer: Self-pay | Admitting: Family Medicine

## 2021-04-14 DIAGNOSIS — J449 Chronic obstructive pulmonary disease, unspecified: Secondary | ICD-10-CM

## 2021-04-14 NOTE — Telephone Encounter (Signed)
Requested medication (s) are due for refill today: requesting 6 days early  Requested medication (s) are on the active medication list: yes  Last refill:  03/21/21  Future visit scheduled: 04/20/21  Notes to clinic:  protocol is to notify MD if requesting inhaler in less than one month, please assess.  Requested Prescriptions  Pending Prescriptions Disp Refills   albuterol (VENTOLIN HFA) 108 (90 Base) MCG/ACT inhaler [Pharmacy Med Name: ALBUTEROL HFA (PROAIR) INHALER] 8.5 each 1    Sig: INHALE 1-2 PUFFS BY MOUTH EVERY 6 HOURS AS NEEDED FOR WHEEZE OR SHORTNESS OF BREATH     Pulmonology:  Beta Agonists Failed - 04/14/2021  1:35 AM      Failed - One inhaler should last at least one month. If the patient is requesting refills earlier, contact the patient to check for uncontrolled symptoms.      Passed - Valid encounter within last 12 months    Recent Outpatient Visits           4 months ago Type 2 diabetes mellitus with diabetic neuropathy, without long-term current use of insulin ([place])   [facility] Juline Patch, MD   6 months ago Acute cystitis without hematuria   [facility] Juline Patch, MD   8 months ago Essential hypertension   [place], MD   9 months ago Establishing care with new doctor, encounter for   [place], MD       Future Appointments             In 6 days Juline Patch, MD [facility], [facility]

## 2021-04-20 ENCOUNTER — Ambulatory Visit: Payer: Medicare Other | Admitting: Family Medicine

## 2021-04-23 ENCOUNTER — Other Ambulatory Visit: Payer: Self-pay | Admitting: Family Medicine

## 2021-04-23 DIAGNOSIS — E039 Hypothyroidism, unspecified: Secondary | ICD-10-CM

## 2021-05-04 ENCOUNTER — Telehealth: Payer: Self-pay

## 2021-05-04 ENCOUNTER — Other Ambulatory Visit: Payer: Self-pay

## 2021-05-04 ENCOUNTER — Ambulatory Visit
Admission: RE | Admit: 2021-05-04 | Discharge: 2021-05-04 | Disposition: A | Discharge status: Home / Self Care | Payer: Commercial Managed Care - HMO | Source: Ambulatory Visit | Attending: Student in an Organized Health Care Education/Training Program | Admitting: Student in an Organized Health Care Education/Training Program

## 2021-05-04 ENCOUNTER — Ambulatory Visit (HOSPITAL_BASED_OUTPATIENT_CLINIC_OR_DEPARTMENT_OTHER): Payer: Commercial Managed Care - HMO | Admitting: Student in an Organized Health Care Education/Training Program

## 2021-05-04 ENCOUNTER — Encounter: Payer: Self-pay | Admitting: Student in an Organized Health Care Education/Training Program

## 2021-05-04 VITALS — BP 172/99 | HR 91 | Temp 97.9°F | Resp 16 | Ht 61.0 in | Wt 140.0 lb

## 2021-05-04 DIAGNOSIS — E1140 Type 2 diabetes mellitus with diabetic neuropathy, unspecified: Secondary | ICD-10-CM

## 2021-05-04 DIAGNOSIS — M5134 Other intervertebral disc degeneration, thoracic region: Secondary | ICD-10-CM | POA: Diagnosis not present

## 2021-05-04 DIAGNOSIS — Z981 Arthrodesis status: Secondary | ICD-10-CM

## 2021-05-04 DIAGNOSIS — G8929 Other chronic pain: Secondary | ICD-10-CM | POA: Insufficient documentation

## 2021-05-04 DIAGNOSIS — F3160 Bipolar disorder, current episode mixed, unspecified: Secondary | ICD-10-CM | POA: Insufficient documentation

## 2021-05-04 DIAGNOSIS — J449 Chronic obstructive pulmonary disease, unspecified: Secondary | ICD-10-CM | POA: Diagnosis not present

## 2021-05-04 DIAGNOSIS — M961 Postlaminectomy syndrome, not elsewhere classified: Secondary | ICD-10-CM | POA: Insufficient documentation

## 2021-05-04 DIAGNOSIS — M5416 Radiculopathy, lumbar region: Secondary | ICD-10-CM | POA: Diagnosis not present

## 2021-05-04 DIAGNOSIS — G894 Chronic pain syndrome: Secondary | ICD-10-CM | POA: Insufficient documentation

## 2021-05-04 MED ORDER — DIAZEPAM 5 MG PO TABS: 5.0000 mg | ORAL_TABLET | Freq: Once | ORAL | 0 refills | Status: AC

## 2021-05-04 NOTE — Telephone Encounter (Signed)
I called the patient with her MRI date of 1/19 in [place]  and she will need a po valium called out.

## 2021-05-04 NOTE — Progress Notes (Signed)
Safety precautions to be maintained throughout the outpatient stay will include: orient to surroundings, keep bed in low position, maintain call bell within reach at all times, provide assistance with transfer out of bed and ambulation.  

## 2021-05-04 NOTE — Telephone Encounter (Signed)
Called patient to notify her that Dr Holley Raring has sent in a valium for her procedure and that she will need to have a driver.  Left message to call office.

## 2021-05-04 NOTE — Progress Notes (Signed)
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   Chronic radicular lumbar pain 05/04/2021   History of lumbar fusion 05/04/2021   Chronic pain syndrome 05/04/2021   Resolved Ambulatory Problems    Diagnosis Date Noted   No Resolved Ambulatory Problems   Past Medical History:  Diagnosis Date   Bipolar 1 disorder ([place])    Cancer ([place])    Diabetes mellitus without complication ([place])    Hx of right breast biopsy 1988   Hypertension    Spinal stenosis    Thyroid disease    Constitutional Exam  General appearance: Well nourished, well developed, and well hydrated. In no apparent acute distress Vitals:   05/04/21 0907  BP: (!) 172/99  Pulse: 91  Resp: 16  Temp: 97.9 F (36.6 C)  SpO2: 96%  Weight: 140 lb (63.5 kg)  Height: 5' 1"  (1.549 m)   BMI Assessment: Estimated body mass index is 26.45 kg/m as calculated from the following:    Height as of this encounter: 5' 1"  (1.549 m).   Weight as of this encounter: 140 lb (63.5 kg).  BMI interpretation table: BMI level Category Range association with higher incidence of chronic pain  <18 kg/m2 Underweight   18.5-24.9 kg/m2 Ideal body weight   25-29.9 kg/m2 Overweight Increased incidence by 20%  30-34.9 kg/m2 Obese (Class I) Increased incidence by 68%  35-39.9 kg/m2 Severe obesity (Class II) Increased incidence by 136%  >40 kg/m2 Extreme obesity (Class III) Increased incidence by 254%   Patient's current BMI Ideal Body weight  Body mass index is 26.45 kg/m. Ideal body weight: 47.8 kg (105 lb 6.1 oz) Adjusted ideal body weight: 54.1 kg (119 lb 3.7 oz)   BMI Readings from Last 4 Encounters:  05/04/21 26.45 kg/m  03/31/21 26.45 kg/m  11/28/20 28.12 kg/m  10/07/20 28.32 kg/m   Wt Readings  from Last 4 Encounters:  05/04/21 140 lb (63.5 kg)  03/31/21 140 lb (63.5 kg)  11/28/20 144 lb (65.3 kg)  10/07/20 145 lb (65.8 kg)    Psych/Mental status: Alert, oriented x 3 (person, place, & time)       Eyes: PERLA Respiratory: No evidence of acute respiratory distress  Lumbar Spine Area Exam  Skin & Axial Inspection: Well healed scar from previous spine surgery detected Alignment: Symmetrical Functional ROM: Pain restricted ROM       Stability: No instability detected Muscle Tone/Strength: Functionally intact. No obvious neuro-muscular anomalies detected. Sensory (Neurological): Dermatomal pain pattern left Palpation: No palpable anomalies       Provocative Tests: Hyperextension/rotation test: Positive due to pain. Positive straight leg raise test on the left Gait & Posture Assessment  Ambulation: Unassisted Gait: Relatively normal for age and body habitus Posture: Difficulty standing up straight, due to pain  Lower Extremity Exam    Side: Right lower extremity  Side: Left lower extremity  Stability: No instability observed          Stability: No instability  observed          Skin & Extremity Inspection: Skin color, temperature, and hair growth are WNL. No peripheral edema or cyanosis. No masses, redness, swelling, asymmetry, or associated skin lesions. No contractures.  Skin & Extremity Inspection: Skin color, temperature, and hair growth are WNL. No peripheral edema or cyanosis. No masses, redness, swelling, asymmetry, or associated skin lesions. No contractures.  Functional ROM: Unrestricted ROM                  Functional ROM: Pain restricted ROM for hip and knee joints          Muscle Tone/Strength: Functionally intact. No obvious neuro-muscular anomalies detected.  Muscle Tone/Strength: Functionally intact. No obvious neuro-muscular anomalies detected.  Sensory (Neurological): Unimpaired        Sensory (Neurological): Dermatomal pain pattern        DTR: Patellar: deferred today Achilles: deferred today Plantar: deferred today  DTR: Patellar: deferred today Achilles: deferred today Plantar: deferred today  Palpation: No palpable anomalies  Palpation: No palpable anomalies    Assessment  Primary Diagnosis & Pertinent Problem List: The primary encounter diagnosis was Failed back surgical syndrome. Diagnoses of Chronic radicular lumbar pain, Lumbar radiculopathy, History of lumbar fusion, Chronic obstructive pulmonary disease, unspecified COPD type ([place]), Type 2 diabetes mellitus with diabetic neuropathy, without long-term current use of insulin ([place]), Bipolar affective disorder, mixed ([place]), Chronic pain syndrome, and Other intervertebral disc degeneration, thoracic region were also pertinent to this visit.  Visit Diagnosis (New problems to examiner): 1. Failed back surgical syndrome   2. Chronic radicular lumbar pain   3. Lumbar radiculopathy   4. History of lumbar fusion   5. Chronic obstructive pulmonary disease, unspecified COPD type ([place])   6. Type 2 diabetes mellitus with diabetic neuropathy, without long-term current use of insulin  ([place])   7. Bipolar affective disorder, mixed ([place])   8. Chronic pain syndrome   9. Other intervertebral disc degeneration, thoracic region    Plan of Care (Initial workup plan)   I discussed  percutaneous spinal cord stimulator trial with the patient in detail utilizing a spine model and reviewing her fluoroscopy images with her.  I was able to evaluate her interlaminar windows under live fluoroscopy and she appears to have patent [id number] interlaminar space to attempt percutaneous SCS trial.. I explained to the patient that they will have an external  power source and programmer which the patient will use for 7 days. There will be daily communication with the stimulator company and the patient. A possible need for a mid-trial clinic visit to give the patient the best chance of success.   Patient will need to have a thorough psychosocial behavioral evaluation. Our office will be happy to help facilitate this. Will place referral.  Some of patient's pain does seem to be mechanical in nature, with some component of neurogenic pain as well. We discussed the indications for spinal cord stimulation, specifically stating that it is typically better for neuropathic and appendicular pain, but that we have had some success in the treatment of low back and hip pain.   Patient is interested in proceeding with spinal cord stimulation trial. He understands that this may not be successful, and that spinal cord stimulation in general is not a "magic bullet."   We had a lengthy and very detailed discussion of all the risks, benefits, alternatives, and rationale of surgery as well as the option of continuing nonsurgical therapies. We specifically discussed the risks of temporary or permanent worsened neurologic injury, no symptomatic relief or pain made worse after procedure, and also the need for future surgery (due to infection, CSF leak, bleeding, adjacent segment issues, bone-healing difficulties, and other related  issues). No guarantees of outcome were made or implied and he is eager to proceed and presents for definitive treatment.  I will see Judythe back after she has completed her thoracic MRI and psych eval to answer any questions and get her scheduled for her Medtronic SCS trial.  Orders Placed This Encounter  Procedures   DG PAIN CLINIC C-ARM 1-60 MIN NO REPORT    Intraoperative interpretation by procedural physician at [facility].    Standing Status:   Standing    Number of Occurrences:   1    Order Specific Question:   Reason for exam:    Answer:   Assistance in needle guidance and placement for procedures requiring needle placement in or near specific anatomical locations not easily accessible without such assistance.   MR THORACIC SPINE WO CONTRAST    Patient presents with axial pain with possible radicular component. Please assist [place] in identifying specific level(s) and laterality of any additional findings such as: 1. Facet (Zygapophyseal) joint DJD (Hypertrophy, space narrowing, subchondral sclerosis, and/or osteophyte formation) 2. DDD and/or IVDD (Loss of disc height, desiccation, gas patterns, osteophytes, endplate sclerosis, or "Black disc disease") 3. Pars defects 4. Spondylolisthesis, spondylosis, and/or spondyloarthropathies (include Degree/Grade of displacement in mm) (stability) 5. Vertebral body Fractures (acute/chronic) (state percentage of collapse) 6. Demineralization (osteopenia/osteoporotic) 7. Bone pathology 8. Foraminal narrowing  9. Surgical changes 10. Central, Lateral Recess, and/or Foraminal Stenosis (include AP diameter of stenosis in mm) 11. Surgical changes (hardware type, status, and presence of fibrosis) 12. Modic Type Changes (MRI only) 13. IVDD (Disc bulge, protrusion, herniation, extrusion) (Level, laterality, extent)    Standing Status:   Future    Standing Expiration Date:   06/04/2021    Scheduling Instructions:     Imaging must be done  as soon as possible. Inform patient that order will expire within 30 days and I will not renew it.    Order Specific Question:   What is the patient's sedation requirement?    Answer:   No Sedation    Order Specific Question:   Does the patient have a pacemaker or implanted devices?    Answer:   No  Order Specific Question:   Preferred imaging location?    Answer:   [facility] (table limit-350lbs)    Order Specific Question:   Call Results- Best Contact Number?    Answer:   [phone] ([facility])    Order Specific Question:   Radiology Contrast Protocol - do NOT remove file path    Answer:   [email or web address]   Ambulatory referral to Psychology    Referral Priority:   Routine    Referral Type:   Psychiatric    Referral Reason:   Specialty Services Required    Referred to Provider:   Renaee Munda, PhD    Requested Specialty:   Psychology    Number of Visits Requested:   1          Imaging Orders         DG PAIN CLINIC C-ARM 1-60 MIN NO REPORT         MR THORACIC SPINE WO CONTRAST    Referral Orders         Ambulatory referral to Psychology         Provider-requested follow-up: Return in about 4 weeks (around 06/01/2021) for review SCS (medtronic) after psych eval and thoracic MRI.  I spent a total of 60 minutes reviewing chart data, face-to-face evaluation with the patient, counseling and coordination of care as detailed above.   Future Appointments  Date Time Provider [place]  05/09/2021  8:20 AM Juline Patch, MD [facility]  05/11/2021  2:00 PM [facility]    Note by: Gillis Santa, MD Date: 05/04/2021; Time: 10:59 AM

## 2021-05-08 ENCOUNTER — Telehealth: Payer: Self-pay

## 2021-05-08 NOTE — Telephone Encounter (Signed)
I got authorization and scheduled the patients MRI for 05/11/21. She has cancelled the appt.

## 2021-05-09 ENCOUNTER — Ambulatory Visit: Payer: Medicare Other | Admitting: Family Medicine

## 2021-05-11 ENCOUNTER — Other Ambulatory Visit: Payer: Commercial Managed Care - HMO

## 2021-05-15 ENCOUNTER — Telehealth: Payer: Self-pay | Admitting: Family Medicine

## 2021-05-15 NOTE — Telephone Encounter (Signed)
Left message for patient to call back and schedule Medicare Annual Wellness Visit (AWV) in office.   If unable to come into the office for AWV,  please offer to do virtually or by telephone.  Last AWV: 01/22/2016 awvs per [place]  Please schedule at anytime with [facility] Health Advisor.      40 Minutes appointment   Any questions, please call me at [phone]

## 2021-05-22 ENCOUNTER — Ambulatory Visit (INDEPENDENT_AMBULATORY_CARE_PROVIDER_SITE_OTHER): Payer: Medicare Other | Admitting: Family Medicine

## 2021-05-22 ENCOUNTER — Other Ambulatory Visit: Payer: Self-pay

## 2021-05-22 ENCOUNTER — Encounter: Payer: Self-pay | Admitting: Family Medicine

## 2021-05-22 DIAGNOSIS — I10 Essential (primary) hypertension: Secondary | ICD-10-CM | POA: Diagnosis not present

## 2021-05-22 DIAGNOSIS — R1319 Other dysphagia: Secondary | ICD-10-CM | POA: Diagnosis not present

## 2021-05-22 DIAGNOSIS — E1140 Type 2 diabetes mellitus with diabetic neuropathy, unspecified: Secondary | ICD-10-CM | POA: Diagnosis not present

## 2021-05-22 DIAGNOSIS — E039 Hypothyroidism, unspecified: Secondary | ICD-10-CM

## 2021-05-22 DIAGNOSIS — E7801 Familial hypercholesterolemia: Secondary | ICD-10-CM | POA: Diagnosis not present

## 2021-05-22 DIAGNOSIS — J449 Chronic obstructive pulmonary disease, unspecified: Secondary | ICD-10-CM | POA: Diagnosis not present

## 2021-05-22 DIAGNOSIS — K219 Gastro-esophageal reflux disease without esophagitis: Secondary | ICD-10-CM | POA: Diagnosis not present

## 2021-05-22 MED ORDER — FLUTICASONE PROPIONATE 50 MCG/ACT NA SUSP: 2.0000 | Freq: Every day | NASAL | 0 refills | Status: DC

## 2021-05-22 MED ORDER — GLIPIZIDE ER 10 MG PO TB24: 10.0000 mg | ORAL_TABLET | Freq: Every day | ORAL | 1 refills | Status: DC

## 2021-05-22 MED ORDER — FAMOTIDINE 40 MG PO TABS: 40.0000 mg | ORAL_TABLET | Freq: Every day | ORAL | 1 refills | Status: DC

## 2021-05-22 MED ORDER — ATORVASTATIN CALCIUM 80 MG PO TABS: 80.0000 mg | ORAL_TABLET | Freq: Every day | ORAL | 1 refills | Status: DC

## 2021-05-22 MED ORDER — METFORMIN HCL ER 500 MG PO TB24: 1000.0000 mg | ORAL_TABLET | Freq: Two times a day (BID) | ORAL | 1 refills | Status: DC

## 2021-05-22 MED ORDER — HYDROCHLOROTHIAZIDE 25 MG PO TABS: 25.0000 mg | ORAL_TABLET | Freq: Every day | ORAL | 1 refills | Status: DC

## 2021-05-22 MED ORDER — ALBUTEROL SULFATE HFA 108 (90 BASE) MCG/ACT IN AERS: INHALATION_SPRAY | RESPIRATORY_TRACT | 2 refills | Status: DC

## 2021-05-22 MED ORDER — LEVOTHYROXINE SODIUM 100 MCG PO TABS: 100.0000 ug | ORAL_TABLET | Freq: Every day | ORAL | 1 refills | Status: DC

## 2021-05-22 MED ORDER — METOPROLOL SUCCINATE ER 50 MG PO TB24: 50.0000 mg | ORAL_TABLET | Freq: Every day | ORAL | 1 refills | Status: DC

## 2021-05-22 NOTE — Progress Notes (Signed)
Date:  05/22/2021   Name:  Mary Nicholson   DOB:  10/14/1955   MRN:  [id number]   Chief Complaint: Hypothyroidism, Hypertension, Diabetes, Gastroesophageal Reflux, Allergic Rhinitis , and Hyperlipidemia  Hypertension This is a chronic problem. The current episode started more than 1 year ago. The problem has been gradually improving since onset. The problem is controlled. Associated symptoms include chest pain. Pertinent negatives include no headaches, neck pain, palpitations or shortness of breath. Risk factors for coronary artery disease include dyslipidemia and diabetes mellitus. Past treatments include beta blockers and diuretics. The current treatment provides moderate improvement. There are no compliance problems.  Identifiable causes of hypertension include a thyroid problem.  Diabetes She presents for her follow-up diabetic visit. Her disease course has been stable. There are no hypoglycemic associated symptoms. Pertinent negatives for hypoglycemia include no dizziness, headaches or nervousness/anxiousness. Associated symptoms include chest pain, fatigue and weight loss. Pertinent negatives for diabetes include no polydipsia. There are no hypoglycemic complications. Symptoms are improving. There are no diabetic complications. Risk factors for coronary artery disease include dyslipidemia, diabetes mellitus and hypertension. Current diabetic treatment includes oral agent (dual therapy). Her weight is stable. She is following a generally healthy diet. Meal planning includes avoidance of concentrated sweets and carbohydrate counting. Her breakfast blood glucose is taken between 8-9 am. Her breakfast blood glucose range is generally 130-140 mg/dl.  Gastroesophageal Reflux She complains of chest pain and dysphagia. She reports no abdominal pain, no coughing, no heartburn, no nausea, no sore throat or no wheezing. This is a chronic problem. The current episode started more than 1 year ago.  The problem has been waxing and waning. The symptoms are aggravated by certain foods. Associated symptoms include fatigue and weight loss. Risk factors: previous esphageal stricture. She has tried a PPI for the symptoms. The treatment provided mild relief.  Hyperlipidemia This is a chronic problem. The current episode started more than 1 year ago. The problem is controlled. Recent lipid tests were reviewed and are normal. Exacerbating diseases include hypothyroidism. Associated symptoms include chest pain. Pertinent negatives include no myalgias or shortness of breath. Current antihyperlipidemic treatment includes statins. The current treatment provides moderate improvement of lipids.  Thyroid Problem Presents for follow-up visit. Symptoms include dry skin, fatigue, nail problem and weight loss. Patient reports no anxiety, constipation, diarrhea, hair loss, palpitations or weight gain. The symptoms have been stable. Her past medical history is significant for hyperlipidemia.   Lab Results  Component Value Date   NA 134 (L) 10/02/2020   K 3.8 10/02/2020   CO2 26 10/02/2020   GLUCOSE 225 (H) 10/02/2020   BUN 23 10/02/2020   CREATININE 0.96 10/02/2020   CALCIUM 9.6 10/02/2020   EGFR 77 07/22/2020   GFRNONAA >60 10/02/2020   Lab Results  Component Value Date   CHOL 148 07/22/2020   HDL 47 07/22/2020   LDLCALC 61 07/22/2020   TRIG 251 (H) 07/22/2020   Lab Results  Component Value Date   TSH 1.750 07/22/2020   Lab Results  Component Value Date   HGBA1C 7.6 (H) 11/28/2020   Lab Results  Component Value Date   WBC 11.9 (H) 10/02/2020   HGB 13.2 10/02/2020   HCT 40.2 10/02/2020   MCV 89.5 10/02/2020   PLT 330 10/02/2020   Lab Results  Component Value Date   ALT 21 07/22/2020   AST 16 07/22/2020   ALKPHOS 103 07/22/2020   BILITOT 0.3 07/22/2020   No results found for: 25OHVITD2,  25OHVITD3, VD25OH   Review of Systems  Constitutional:  Positive for fatigue and weight loss.  Negative for chills, fever and weight gain.  HENT:  Negative for drooling, ear discharge, ear pain and sore throat.   Respiratory:  Negative for cough, shortness of breath and wheezing.   Cardiovascular:  Positive for chest pain. Negative for palpitations and leg swelling.  Gastrointestinal:  Positive for dysphagia. Negative for abdominal pain, blood in stool, constipation, diarrhea, heartburn and nausea.  Endocrine: Negative for polydipsia.  Genitourinary:  Negative for dysuria, frequency, hematuria and urgency.  Musculoskeletal:  Negative for back pain, myalgias and neck pain.  Skin:  Negative for rash.  Allergic/Immunologic: Negative for environmental allergies.  Neurological:  Negative for dizziness and headaches.  Hematological:  Does not bruise/bleed easily.  Psychiatric/Behavioral:  Negative for suicidal ideas. The patient is not nervous/anxious.    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   Chronic radicular lumbar pain 05/04/2021   History of lumbar fusion 05/04/2021   Chronic pain syndrome 05/04/2021   Bipolar affective disorder, mixed ([place]) 11/26/2019   COPD (chronic obstructive pulmonary disease) ([place]) 11/26/2019   PTSD (post-traumatic stress disorder) 11/26/2019   Schizophrenia ([place]) 11/26/2019   Abdominal wall cellulitis 11/12/2019   Abdominal wall abscess    Type 2 diabetes mellitus with diabetic neuropathy, without long-term current use of insulin (HCC)    Hyperlipidemia    Bilateral hand numbness 09/16/2019   S/P hysterectomy 09/09/2019   Stage 3b chronic kidney disease ([place]) 09/09/2019   Cervical radiculitis 08/13/2019   Chronic bilateral low back pain with right-sided sciatica 08/13/2019   Lumbar radiculopathy 08/13/2019   Neck pain 08/13/2019   Numbness and tingling of both feet 07/30/2019   Dupuytren contracture 05/22/2019   Personal history of transient ischemic attack (TIA), and cerebral infarction  without residual deficits 05/22/2019   Resting tremor 05/22/2019   Diverticulosis 05/20/2019   External hemorrhoid 05/20/2019   Abnormal cervical Papanicolaou smear 12/19/2018   Arthralgia of both hands 12/19/2018   Breast pain, right 12/19/2018   Diarrhea 12/19/2018   Dysphagia 12/19/2018   Paresthesias 12/19/2018   Seasonal allergies 12/19/2018   Gastroesophageal reflux disease 09/23/2015   Spinal stenosis of lumbar region 05/04/2015   Allergic rhinitis 11/28/2014   Acquired hypothyroidism 09/13/2014   Essential hypertension 09/13/2014   Type 2 diabetes mellitus with diabetic neuropathy ([place]) 09/13/2014    No Known Allergies  Past Surgical History:  Procedure Laterality Date   ABDOMINAL HYSTERECTOMY     BACK SURGERY     BREAST BIOPSY Right    negative 1988   CHOLECYSTECTOMY      Social History   Tobacco Use   Smoking status: Every Day    Packs/day: 0.50    Types: Cigarettes    Last attempt to quit: 10/26/2019    Years since quitting: 1.5   Smokeless tobacco: Never  Substance Use Topics   Alcohol use: Yes    Comment: Occasional   Drug use: Never     Medication list has been reviewed and updated.  Current Meds  Medication Sig   Acetaminophen (TYLENOL ARTHRITIS EXT RELIEF PO) Take 1 capsule by mouth 3 (three) times daily.   albuterol (VENTOLIN HFA) 108 (90 Base) MCG/ACT inhaler INHALE 1-2 PUFFS BY MOUTH EVERY 6 HOURS AS NEEDED FOR WHEEZE OR SHORTNESS OF BREATH   aspirin 81 MG chewable tablet Chew by mouth.   atorvastatin (LIPITOR) 80 MG tablet Take 1 tablet (  80 mg total) by mouth daily.   b complex vitamins capsule Take 1 capsule by mouth daily.   Biotin 5 MG CAPS Take 1 capsule by mouth daily.   busPIRone (BUSPAR) 15 MG tablet Take 15 mg by mouth 3 (three) times daily.   Cetirizine HCl (ALLERGY RELIEF) 10 MG CAPS Take 1 capsule by mouth daily.   cyclobenzaprine (FLEXERIL) 10 MG tablet Take 10 mg by mouth 3 (three) times daily as needed.   famotidine (PEPCID)  40 MG tablet TAKE 1 TABLET BY MOUTH EVERY DAY   fluticasone (FLONASE) 50 MCG/ACT nasal spray Place 2 sprays into both nostrils daily.   Fluticasone-Umeclidin-Vilant (TRELEGY ELLIPTA) 100-62.5-25 MCG/INH AEPB Inhale 1 puff into the lungs daily.   gabapentin (NEURONTIN) 800 MG tablet Take 800 mg by mouth 3 (three) times daily. neuro   glipiZIDE (GLUCOTROL XL) 10 MG 24 hr tablet Take 1 tablet (10 mg total) by mouth daily.   hydrochlorothiazide (HYDRODIURIL) 25 MG tablet Take 1 tablet (25 mg total) by mouth daily.   hydrOXYzine (VISTARIL) 25 MG capsule Take by mouth.   lamoTRIgine (LAMICTAL) 200 MG tablet Take 200 mg by mouth 2 (two) times daily.   levothyroxine (SYNTHROID) 100 MCG tablet TAKE 1 TABLET BY MOUTH EVERY DAY   metFORMIN (GLUCOPHAGE-XR) 500 MG 24 hr tablet Take 2 tablets (1,000 mg total) by mouth 2 (two) times daily.   metoprolol succinate (TOPROL-XL) 50 MG 24 hr tablet Take 1 tablet (50 mg total) by mouth daily.   Multiple Vitamins-Minerals (CENTRUM SILVER PO) Take 1 each by mouth daily.   Multiple Vitamins-Minerals (PRESERVISION AREDS) CAPS Take 1 capsule by mouth 2 (two) times daily.   mupirocin ointment (BACTROBAN) 2 % Apply 1 application topically 2 (two) times daily. For tick bite   ondansetron (ZOFRAN) 4 MG tablet Take 1 tablet (4 mg total) by mouth every 8 (eight) hours as needed for up to 10 doses for nausea or vomiting.   OneTouch Delica Lancets 85I MISC Apply 1 each topically 3 (three) times daily.   ONETOUCH ULTRA test strip 3 (three) times daily.   SM IRON 325 (65 Fe) MG tablet Take by mouth.   traZODone (DESYREL) 50 MG tablet Take 50 mg by mouth at bedtime as needed.   TRELEGY ELLIPTA 100-62.5-25 MCG/INH AEPB Inhale 1 puff into the lungs daily.   venlafaxine XR (EFFEXOR-XR) 75 MG 24 hr capsule Take 150 mg by mouth in the morning and at bedtime.    [DISCONTINUED] meloxicam (MOBIC) 15 MG tablet Take 15 mg by mouth daily.    PHQ 2/9 Scores 05/22/2021 05/04/2021 10/07/2020  07/14/2020  PHQ - 2 Score 0 0 1 1  PHQ- 9 Score 0 - 2 5    GAD 7 : Generalized Anxiety Score 05/22/2021 10/07/2020 07/14/2020  Nervous, Anxious, on Edge 0 1 2  Control/stop worrying 0 0 3  Worry too much - different things 0 0 3  Trouble relaxing 0 0 2  Restless 0 0 1  Easily annoyed or irritable 0 0 2  Afraid - awful might happen 0 0 0  Total GAD 7 Score 0 1 13  Anxiety Difficulty Not difficult at all Not difficult at all Very difficult    BP Readings from Last 3 Encounters:  05/22/21 138/80  05/04/21 (!) 172/99  03/31/21 (!) 143/77    Physical Exam Vitals and nursing note reviewed.  Constitutional:      Appearance: She is well-developed.  HENT:     Head: Normocephalic.  Right Ear: Tympanic membrane, ear canal and external ear normal.     Left Ear: Tympanic membrane, ear canal and external ear normal.  Eyes:     General: Lids are everted, no foreign bodies appreciated. No scleral icterus.       Left eye: No foreign body or hordeolum.     Conjunctiva/sclera: Conjunctivae normal.     Right eye: Right conjunctiva is not injected.     Left eye: Left conjunctiva is not injected.     Pupils: Pupils are equal, round, and reactive to light.  Neck:     Thyroid: No thyromegaly.     Vascular: No JVD.     Trachea: No tracheal deviation.  Cardiovascular:     Rate and Rhythm: Normal rate and regular rhythm.     Heart sounds: Normal heart sounds. No murmur heard.   No friction rub. No gallop.  Pulmonary:     Effort: Pulmonary effort is normal. No respiratory distress.     Breath sounds: Normal breath sounds. No wheezing, rhonchi or rales.  Abdominal:     General: Bowel sounds are normal.     Palpations: Abdomen is soft. There is no mass.     Tenderness: There is no abdominal tenderness. There is no guarding or rebound.  Musculoskeletal:        General: No tenderness. Normal range of motion.     Cervical back: Normal range of motion and neck supple.  Lymphadenopathy:      Cervical: No cervical adenopathy.  Skin:    General: Skin is warm.     Findings: No bruising, erythema or rash.  Neurological:     Mental Status: She is alert and oriented to person, place, and time.     Cranial Nerves: No cranial nerve deficit.     Deep Tendon Reflexes: Reflexes normal.  Psychiatric:        Mood and Affect: Mood is not anxious or depressed.    Wt Readings from Last 3 Encounters:  05/22/21 137 lb (62.1 kg)  05/04/21 140 lb (63.5 kg)  03/31/21 140 lb (63.5 kg)    BP 138/80    Pulse 80    Ht 5' 1"  (1.549 m)    Wt 137 lb (62.1 kg)    BMI 25.89 kg/m   Assessment and Plan: Patient is actually being seen for presurgical encounter for dental work that needs to have A1c become below 7 for healing purposes.  This is going to be a difficult endeavor to be able to have her diabetes under control and that she did not go to her endocrinology appointment in our unable to get her in with endocrine and where expected to have her controlled by the end of February for her surgical procedure. 1. Familial hypercholesterolemia Chronic.  Controlled.  Continue atorvastatin 80 mg once a day. - atorvastatin (LIPITOR) 80 MG tablet; Take 1 tablet (80 mg total) by mouth daily.  Dispense: 90 tablet; Refill: 1 - Lipid Panel With LDL/HDL Ratio  2. Chronic obstructive pulmonary disease, unspecified COPD type (HCC) Chronic.  Controlled.  Stable.  Albuterol inhaler 1 to 2 puffs every 6 hours as - albuterol (VENTOLIN HFA) 108 (90 Base) MCG/ACT inhaler; INHALE 1-2 PUFFS BY MOUTH EVERY 6 HOURS AS NEEDED FOR WHEEZE OR SHORTNESS OF BREATH  Dispense: 8 g; Refill: 2  3. Gastroesophageal reflux disease without esophagitis Chronic.  Controlled.  Stable.  Continue famotidine 40 mg once a day. - famotidine (PEPCID) 40 MG tablet; Take 1 tablet (  40 mg total) by mouth daily.  Dispense: 90 tablet; Refill: 1  4. Esophageal dysphagia Onset.  Patient experienced dysphagia we will be referring to GI for  evaluation - famotidine (PEPCID) 40 MG tablet; Take 1 tablet (40 mg total) by mouth daily.  Dispense: 90 tablet; Refill: 1  5. Type 2 diabetes mellitus with diabetic neuropathy, without long-term current use of insulin (HCC) Currently on glipizide 10 mg once a day metformin 500 mg twice a day.  Last A1c was over 7 and this is required to be under 7 for healing purposes for oral surgery. - glipiZIDE (GLUCOTROL XL) 10 MG 24 hr tablet; Take 1 tablet (10 mg total) by mouth daily.  Dispense: 90 tablet; Refill: 1 - metFORMIN (GLUCOPHAGE-XR) 500 MG 24 hr tablet; Take 2 tablets (1,000 mg total) by mouth 2 (two) times daily.  Dispense: 360 tablet; Refill: 1 - HgB A1c  6. Essential hypertension Chronic.  Controlled.  Stable.  Blood pressure 138/80.  Continue hydrochlorothiazide 25 mg once a day and metoprolol XL 50 mg once a day we will check renal function panel. - hydrochlorothiazide (HYDRODIURIL) 25 MG tablet; Take 1 tablet (25 mg total) by mouth daily.  Dispense: 90 tablet; Refill: 1 - metoprolol succinate (TOPROL-XL) 50 MG 24 hr tablet; Take 1 tablet (50 mg total) by mouth daily.  Dispense: 90 tablet; Refill: 1 - Renal Function Panel  7. Acquired hypothyroidism Chronic.  Controlled.  Stable.  Will check thyroid panel and if appropriate will continue levothyroxine 100 mcg daily. - levothyroxine (SYNTHROID) 100 MCG tablet; Take 1 tablet (100 mcg total) by mouth daily.  Dispense: 90 tablet; Refill: 1 - Thyroid Panel With TSH

## 2021-05-24 ENCOUNTER — Telehealth: Payer: Self-pay

## 2021-05-24 DIAGNOSIS — Z1389 Encounter for screening for other disorder: Secondary | ICD-10-CM | POA: Diagnosis not present

## 2021-05-24 DIAGNOSIS — Z79899 Other long term (current) drug therapy: Secondary | ICD-10-CM | POA: Diagnosis not present

## 2021-05-24 NOTE — Telephone Encounter (Signed)
I called [facility] and the labs were drawn. They are stating the A1C is "missing" so she will send it out and "put high priority on it." I  told her another doctor's office is waiting on this.

## 2021-05-24 NOTE — Telephone Encounter (Signed)
Copied from [place] [id number]. Topic: General - Inquiry >> May 24, 2021  7:34 AM Scherrie Gerlach wrote: Reason for CRM: pt states Baxter Flattery called and wanted to know if she got her labs drawn the other day, and pt states yes she did.

## 2021-05-25 ENCOUNTER — Ambulatory Visit
Admission: RE | Admit: 2021-05-25 | Discharge: 2021-05-25 | Disposition: A | Discharge status: Home / Self Care | Payer: Medicare Other | Source: Ambulatory Visit | Attending: Student in an Organized Health Care Education/Training Program | Admitting: Student in an Organized Health Care Education/Training Program

## 2021-05-25 ENCOUNTER — Other Ambulatory Visit: Payer: Self-pay

## 2021-05-25 DIAGNOSIS — M5134 Other intervertebral disc degeneration, thoracic region: Secondary | ICD-10-CM | POA: Diagnosis not present

## 2021-05-25 DIAGNOSIS — M2578 Osteophyte, vertebrae: Secondary | ICD-10-CM | POA: Diagnosis not present

## 2021-05-25 DIAGNOSIS — M549 Dorsalgia, unspecified: Secondary | ICD-10-CM | POA: Diagnosis not present

## 2021-05-28 LAB — RENAL FUNCTION PANEL
Albumin: 4.8 g/dL (ref 3.8–4.8)
BUN/Creatinine Ratio: 19 (ref 12–28)
BUN: 12 mg/dL (ref 8–27)
CO2: 23 mmol/L (ref 20–29)
Calcium: 10.1 mg/dL (ref 8.7–10.3)
Chloride: 101 mmol/L (ref 96–106)
Creatinine, Ser: 0.64 mg/dL (ref 0.57–1.00)
Glucose: 56 mg/dL — ABNORMAL LOW (ref 70–99)
Phosphorus: 4.3 mg/dL (ref 3.0–4.3)
Potassium: 4.7 mmol/L (ref 3.5–5.2)
Sodium: 143 mmol/L (ref 134–144)
eGFR: 97 mL/min/{1.73_m2} (ref >59)

## 2021-05-28 LAB — LIPID PANEL WITH LDL/HDL RATIO
Cholesterol, Total: 144 mg/dL (ref 100–199)
HDL: 45 mg/dL (ref >39)
LDL Chol Calc (NIH): 62 mg/dL (ref 0–99)
LDL/HDL Ratio: 1.4 ratio (ref 0.0–3.2)
Triglycerides: 231 mg/dL — ABNORMAL HIGH (ref 0–149)
VLDL Cholesterol Cal: 37 mg/dL (ref 5–40)

## 2021-05-28 LAB — THYROID PANEL WITH TSH
Free Thyroxine Index: 1.5 (ref 1.2–4.9)
T3 Uptake Ratio: 23 % — ABNORMAL LOW (ref 24–39)
T4, Total: 6.4 ug/dL (ref 4.5–12.0)
TSH: 3.79 u[IU]/mL (ref 0.450–4.500)

## 2021-05-28 LAB — HEMOGLOBIN A1C

## 2021-05-29 ENCOUNTER — Other Ambulatory Visit: Payer: Self-pay

## 2021-05-29 DIAGNOSIS — E1140 Type 2 diabetes mellitus with diabetic neuropathy, unspecified: Secondary | ICD-10-CM

## 2021-05-31 DIAGNOSIS — E1140 Type 2 diabetes mellitus with diabetic neuropathy, unspecified: Secondary | ICD-10-CM | POA: Diagnosis not present

## 2021-06-01 ENCOUNTER — Other Ambulatory Visit: Payer: Self-pay

## 2021-06-01 DIAGNOSIS — E1140 Type 2 diabetes mellitus with diabetic neuropathy, unspecified: Secondary | ICD-10-CM

## 2021-06-01 LAB — HEMOGLOBIN A1C
Est. average glucose Bld gHb Est-mCnc: 163 mg/dL
Hgb A1c MFr Bld: 7.3 % — ABNORMAL HIGH (ref 4.8–5.6)

## 2021-06-27 ENCOUNTER — Telehealth: Payer: Self-pay | Admitting: Student in an Organized Health Care Education/Training Program

## 2021-06-29 NOTE — Telephone Encounter (Signed)
I called patient to see if she has had psych eval. She states yes she had 2 wks ago. I called [facility] and they stated they sent report. I asked if she would send again and gave her 2 numbers. We have not received. I will call again.  ?

## 2021-07-09 NOTE — Progress Notes (Signed)
? ? ?Gastroenterology Consultation ? ?Referring Provider:     Juline Patch, MD ?Primary Care Physician:  Juline Patch, MD ?Primary Gastroenterologist:  Dr. Allen Norris     ?Reason for Consultation:     Dysphagia ?      ? HPI:   ?Mary Nicholson is a 66 y.o. y/o female referred for consultation & management of Dysphagia by Dr. Juline Patch, MD.  This patient comes to see me today after being seen by Dr. Vira Agar in the past and then back in 2020 was seen at [facility] for diarrhea and dysphagia.  The upper endoscopy and colonoscopy at that time showed: ? ?Impression: - The examined portion of the ileum was normal. ?- Diverticulosis in the sigmoid colon, in the descending  ?colon, in the transverse colon and in the ascending colon. ?- Normal mucosa in the entire examined colon. Biopsied. ?- External hemorrhoids. ? ?Impression: - Normal upper third of esophagus and middle third of  ?esophagus. Biopsied. ?- LA Grade B reflux esophagitis. Rule out Barrett's  ?esophagus. Biopsied. ?- Erythematous mucosa in the antrum. Biopsied. ?- Normal examined duodenum. Biopsied. ? ?The patient's biopsy showed: ? ?DIAGNOSIS    ?A.  Duodenum, endoscopic biopsy: ?Duodenal mucosa with no significant pathologic diagnosis. ?No villous blunting or intraepithelial lymphocytosis is seen. ?  ?B.  Stomach, endoscopic biopsy: ?Gastric antral mucosa with mild reactive foveolar hyperplasia. ?Gastric oxyntic mucosa with no significant pathologic diagnosis. ?Negative for Helicobacter pylori on the H&E stains. ?  ?C.  Esophagus, distal, endoscopic biopsy: ?Squamocolumnar junction mucosa with chronic and acute inflammation. ?No intestinal metaplasia or dysplasia identified.  ?  ?D.  Esophagus, proximal, endoscopic biopsy: ?Esophageal squamous mucosa with no significant pathologic diagnosis. ?No increase in intraepithelial eosinophils is seen. ?  ?E.  Colon, random, endoscopic biopsy: ?Colonic mucosa with no significant  pathologic diagnosis. ?No active or chronic colitis is seen. ?No evidence of lymphocytic or collagenous colitis.  ? ? ?The patient was found to have increased fecal fat with decreased pancreatic elastase. The patient celiac sprue panel was negative.  ?She reports in 2019 she was treated in [place] with dilation and was doing well until some foods started to bother her.  ? ?The patient reports that her dysphagia is both to solids and liquids and not uncommon to rice and even water. ? ?Past Medical History:  ?Diagnosis Date  ? Bipolar 1 disorder ([place])   ? Cancer [facility])   ? cervical  ? Diabetes mellitus without complication ([place])   ? Hx of right breast biopsy 1988  ? RIGHT excisional bx 1988  ? Hyperlipidemia   ? Hypertension   ? PTSD (post-traumatic stress disorder)   ? Schizophrenia ([place])   ? Spinal stenosis   ? Thyroid disease   ? ? ?Past Surgical History:  ?Procedure Laterality Date  ? ABDOMINAL HYSTERECTOMY    ? BACK SURGERY    ? BREAST BIOPSY Right   ? negative 1988  ? CHOLECYSTECTOMY    ? ? ?Prior to Admission medications   ?Medication Sig Start Date End Date Taking? Authorizing Provider  ?Acetaminophen (TYLENOL ARTHRITIS EXT RELIEF PO) Take 1 capsule by mouth 3 (three) times daily.    [provider]  ?albuterol (VENTOLIN HFA) 108 (90 Base) MCG/ACT inhaler INHALE 1-2 PUFFS BY MOUTH EVERY 6 HOURS AS NEEDED FOR WHEEZE OR SHORTNESS OF BREATH 05/22/21   Juline Patch, MD  ?aspirin 81 MG chewable tablet Chew by mouth.    [provider]  ?  atorvastatin (LIPITOR) 80 MG tablet Take 1 tablet (80 mg total) by mouth daily. 05/22/21   Juline Patch, MD  ?b complex vitamins capsule Take 1 capsule by mouth daily.    [provider]  ?Biotin 5 MG CAPS Take 1 capsule by mouth daily.    [provider]  ?busPIRone (BUSPAR) 15 MG tablet Take 15 mg by mouth 3 (three) times daily. 08/27/19   [provider]  ?Cetirizine HCl (ALLERGY RELIEF) 10 MG CAPS Take 1 capsule by mouth daily.     [provider]  ?cyclobenzaprine (FLEXERIL) 10 MG tablet Take 10 mg by mouth 3 (three) times daily as needed. 07/31/19   [provider]  ?famotidine (PEPCID) 40 MG tablet Take 1 tablet (40 mg total) by mouth daily. 05/22/21   Juline Patch, MD  ?fluticasone (FLONASE) 50 MCG/ACT nasal spray Place 2 sprays into both nostrils daily. 05/22/21   Juline Patch, MD  ?Fluticasone-Umeclidin-Vilant (TRELEGY ELLIPTA) 100-62.5-25 MCG/INH AEPB Inhale 1 puff into the lungs daily. 07/22/20   Juline Patch, MD  ?gabapentin (NEURONTIN) 800 MG tablet Take 800 mg by mouth 3 (three) times daily. neuro 08/13/19   [provider]  ?glipiZIDE (GLUCOTROL XL) 10 MG 24 hr tablet Take 1 tablet (10 mg total) by mouth daily. 05/22/21   Juline Patch, MD  ?hydrochlorothiazide (HYDRODIURIL) 25 MG tablet Take 1 tablet (25 mg total) by mouth daily. 05/22/21   Juline Patch, MD  ?hydrOXYzine (VISTARIL) 25 MG capsule Take by mouth. 06/24/19   [provider]  ?lamoTRIgine (LAMICTAL) 200 MG tablet Take 200 mg by mouth 2 (two) times daily. 10/30/19   [provider]  ?levothyroxine (SYNTHROID) 100 MCG tablet Take 1 tablet (100 mcg total) by mouth daily. 05/22/21   Juline Patch, MD  ?metFORMIN (GLUCOPHAGE-XR) 500 MG 24 hr tablet Take 2 tablets (1,000 mg total) by mouth 2 (two) times daily. 05/22/21   Juline Patch, MD  ?metoprolol succinate (TOPROL-XL) 50 MG 24 hr tablet Take 1 tablet (50 mg total) by mouth daily. 05/22/21   Juline Patch, MD  ?Multiple Vitamins-Minerals (CENTRUM SILVER PO) Take 1 each by mouth daily.    [provider]  ?Multiple Vitamins-Minerals (PRESERVISION AREDS) CAPS Take 1 capsule by mouth 2 (two) times daily.    [provider]  ?mupirocin ointment (BACTROBAN) 2 % Apply 1 application topically 2 (two) times daily. For tick bite 07/22/20   Juline Patch, MD  ?ondansetron (ZOFRAN) 4 MG tablet Take 1 tablet (4 mg total) by mouth every 8 (eight) hours as needed for  up to 10 doses for nausea or vomiting. 04/20/20   Lucrezia Starch, MD  ?Jonetta Speak Lancets 19E MISC Apply 1 each topically 3 (three) times daily. 10/15/19   [provider]  ?Donald Siva test strip 3 (three) times daily. 11/01/19   [provider]  ?SM IRON 325 (65 Fe) MG tablet Take by mouth. 06/24/19   [provider]  ?traZODone (DESYREL) 50 MG tablet Take 50 mg by mouth at bedtime as needed. 11/11/19   [provider]  ?Donnal Debar 100-62.5-25 MCG/INH AEPB Inhale 1 puff into the lungs daily. 11/19/19   [provider]  ?venlafaxine XR (EFFEXOR-XR) 75 MG 24 hr capsule Take 150 mg by mouth in the morning and at bedtime.  10/02/19   [provider]  ? ? ?Family History  ?Problem Relation Age of Onset  ? Diabetes Mother   ?  Heart failure Mother   ? Ovarian cancer Mother 18  ?     question dx?  ? Alzheimer's disease Father   ? COPD Father   ? Breast cancer Neg Hx   ?  ? ?Social History  ? ?Tobacco Use  ? Smoking status: Every Day  ?  Packs/day: 0.50  ?  Types: Cigarettes  ?  Last attempt to quit: 10/26/2019  ?  Years since quitting: 1.7  ? Smokeless tobacco: Never  ?Substance Use Topics  ? Alcohol use: Yes  ?  Comment: Occasional  ? Drug use: Never  ? ? ?Allergies as of 07/10/2021  ? (No Known Allergies)  ? ? ?Review of Systems:    ?All systems reviewed and negative except where noted in HPI. ? ? Physical Exam:  ?There were no vitals taken for this visit. ?No LMP recorded. Patient is postmenopausal. ?General:   Alert,  Well-developed, well-nourished, pleasant and cooperative in NAD ?Head:  Normocephalic and atraumatic. ?Eyes:  Sclera clear, no icterus.   Conjunctiva pink. ?Ears:  Normal auditory acuity. ?Neck:  Supple; no masses or thyromegaly. ?Lungs:  Respirations even and unlabored.  Clear throughout to auscultation.   No wheezes, crackles, or rhonchi. No acute distress. ?Heart:  Regular rate and rhythm; no murmurs, clicks, rubs, or gallops. ?Abdomen:   Normal bowel sounds.  No bruits.  Soft, non-tender and non-distended without masses, hepatosplenomegaly or hernias noted.  No guarding or rebound tenderness.  Negative Carnett sign.   ?Rectal:  Deferred.  ?Pulses:

## 2021-07-10 ENCOUNTER — Other Ambulatory Visit: Payer: Self-pay

## 2021-07-10 ENCOUNTER — Ambulatory Visit (INDEPENDENT_AMBULATORY_CARE_PROVIDER_SITE_OTHER): Payer: Medicare Other | Admitting: Gastroenterology

## 2021-07-10 ENCOUNTER — Encounter: Payer: Self-pay | Admitting: Gastroenterology

## 2021-07-10 VITALS — BP 159/77 | HR 73 | Temp 98.4°F | Ht 61.0 in | Wt 138.0 lb

## 2021-07-10 DIAGNOSIS — R1319 Other dysphagia: Secondary | ICD-10-CM | POA: Diagnosis not present

## 2021-07-17 ENCOUNTER — Ambulatory Visit (INDEPENDENT_AMBULATORY_CARE_PROVIDER_SITE_OTHER): Payer: Medicare Other

## 2021-07-17 DIAGNOSIS — Z122 Encounter for screening for malignant neoplasm of respiratory organs: Secondary | ICD-10-CM

## 2021-07-17 DIAGNOSIS — Z1231 Encounter for screening mammogram for malignant neoplasm of breast: Secondary | ICD-10-CM

## 2021-07-17 DIAGNOSIS — Z0000 Encounter for general adult medical examination without abnormal findings: Secondary | ICD-10-CM | POA: Diagnosis not present

## 2021-07-17 NOTE — Progress Notes (Signed)
? ?Subjective:  ? Mary Nicholson is a 66 y.o. female who presents for Medicare Annual (Subsequent) preventive examination. ? ?Virtual Visit via Telephone Note ? ?I connected with  Mary Nicholson on 07/17/21 at  3:00 PM EDT by telephone and verified that I am speaking with the correct person using two identifiers. ? ?Location: ?Patient: home ?Provider: [facility] ?Persons participating in the virtual visit: patient/Nurse Health Advisor ?  ?I discussed the limitations, risks, security and privacy concerns of performing an evaluation and management service by telephone and the availability of in person appointments. The patient expressed understanding and agreed to proceed. ? ?Interactive audio and video telecommunications were attempted between this nurse and patient, however failed, due to patient having technical difficulties OR patient did not have access to video capability.  We continued and completed visit with audio only. ? ?Some vital signs may be absent or patient reported.  ? ?Clemetine Marker, LPN ? ? ?Review of Systems    ? ?Cardiac Risk Factors include: advanced age (>57mn, >>68women);hypertension;dyslipidemia;diabetes mellitus;smoking/ tobacco exposure;sedentary lifestyle ? ?   ?Objective:  ?  ?Today's Vitals  ? 07/17/21 1505  ?PainSc: 8   ? ?There is no height or weight on file to calculate BMI. ? ? ?  07/17/2021  ?  3:15 PM 05/04/2021  ?  9:15 AM 03/31/2021  ?  9:54 AM 04/20/2020  ?  2:15 PM 11/12/2019  ?  5:00 PM  ?Advanced Directives  ?Does Patient Have a Medical Advance Directive? No No No No No  ?Would patient like information on creating a medical advance directive? Yes (MAU/Ambulatory/Procedural Areas - Information given) No - Patient declined No - Patient declined No - Patient declined Yes (Inpatient - patient requests chaplain consult to create a medical advance directive)  ? ? ?Current Medications (verified) ?Outpatient Encounter Medications as of 07/17/2021  ?Medication Sig  ? Acetaminophen  (TYLENOL ARTHRITIS EXT RELIEF PO) Take 1 capsule by mouth 3 (three) times daily.  ? albuterol (VENTOLIN HFA) 108 (90 Base) MCG/ACT inhaler INHALE 1-2 PUFFS BY MOUTH EVERY 6 HOURS AS NEEDED FOR WHEEZE OR SHORTNESS OF BREATH  ? aspirin 81 MG chewable tablet Chew by mouth.  ? atorvastatin (LIPITOR) 80 MG tablet Take 1 tablet (80 mg total) by mouth daily.  ? b complex vitamins capsule Take 1 capsule by mouth daily.  ? Biotin 5 MG CAPS Take 1 capsule by mouth daily.  ? busPIRone (BUSPAR) 15 MG tablet Take 15 mg by mouth 3 (three) times daily.  ? Cetirizine HCl (ALLERGY RELIEF) 10 MG CAPS Take 1 capsule by mouth daily.  ? famotidine (PEPCID) 40 MG tablet Take 1 tablet (40 mg total) by mouth daily.  ? fluticasone (FLONASE) 50 MCG/ACT nasal spray Place 2 sprays into both nostrils daily.  ? Fluticasone-Umeclidin-Vilant (TRELEGY ELLIPTA) 100-62.5-25 MCG/INH AEPB Inhale 1 puff into the lungs daily.  ? glipiZIDE (GLUCOTROL XL) 10 MG 24 hr tablet Take 1 tablet (10 mg total) by mouth daily.  ? hydrochlorothiazide (HYDRODIURIL) 25 MG tablet Take 1 tablet (25 mg total) by mouth daily.  ? hydrOXYzine (ATARAX) 25 MG tablet Take 25 mg by mouth 2 (two) times daily.  ? lamoTRIgine (LAMICTAL) 200 MG tablet Take 200 mg by mouth 2 (two) times daily.  ? levothyroxine (SYNTHROID) 100 MCG tablet Take 1 tablet (100 mcg total) by mouth daily.  ? metFORMIN (GLUCOPHAGE-XR) 500 MG 24 hr tablet Take 2 tablets (1,000 mg total) by mouth 2 (two) times daily.  ? metoprolol succinate (TOPROL-XL)  50 MG 24 hr tablet Take 1 tablet (50 mg total) by mouth daily.  ? Multiple Vitamins-Minerals (CENTRUM SILVER PO) Take 1 each by mouth daily.  ? ONETOUCH ULTRA test strip 3 (three) times daily.  ? SM IRON 325 (65 Fe) MG tablet Take by mouth.  ? traZODone (DESYREL) 50 MG tablet Take 50 mg by mouth at bedtime as needed.  ? venlafaxine XR (EFFEXOR-XR) 75 MG 24 hr capsule Take 150 mg by mouth in the morning and at bedtime.   ? [DISCONTINUED] Multiple  Vitamins-Minerals (PRESERVISION AREDS) CAPS Take 1 capsule by mouth 2 (two) times daily.  ? [DISCONTINUED] cyclobenzaprine (FLEXERIL) 10 MG tablet Take 10 mg by mouth 3 (three) times daily as needed.  ? [DISCONTINUED] gabapentin (NEURONTIN) 800 MG tablet Take 800 mg by mouth 3 (three) times daily. neuro  ? [DISCONTINUED] hydrOXYzine (VISTARIL) 25 MG capsule Take by mouth.  ? [DISCONTINUED] mupirocin ointment (BACTROBAN) 2 % Apply 1 application topically 2 (two) times daily. For tick bite  ? [DISCONTINUED] ondansetron (ZOFRAN) 4 MG tablet Take 1 tablet (4 mg total) by mouth every 8 (eight) hours as needed for up to 10 doses for nausea or vomiting.  ? [DISCONTINUED] OneTouch Delica Lancets 94I MISC Apply 1 each topically 3 (three) times daily.  ? [DISCONTINUED] TRELEGY ELLIPTA 100-62.5-25 MCG/INH AEPB Inhale 1 puff into the lungs daily.  ? ?No facility-administered encounter medications on file as of 07/17/2021.  ? ? ?Allergies (verified) ?Patient has no known allergies.  ? ?History: ?Past Medical History:  ?Diagnosis Date  ? Bipolar 1 disorder ([place])   ? Cancer [facility])   ? cervical  ? Diabetes mellitus without complication ([place])   ? Hx of right breast biopsy 1988  ? RIGHT excisional bx 1988  ? Hyperlipidemia   ? Hypertension   ? PTSD (post-traumatic stress disorder)   ? Schizophrenia ([place])   ? Spinal stenosis   ? Thyroid disease   ? ?Past Surgical History:  ?Procedure Laterality Date  ? ABDOMINAL HYSTERECTOMY    ? BACK SURGERY    ? BREAST BIOPSY Right   ? negative 1988  ? CHOLECYSTECTOMY    ? ?Family History  ?Problem Relation Age of Onset  ? Diabetes Mother   ? Heart failure Mother   ? Ovarian cancer Mother 46  ?     question dx?  ? Alzheimer's disease Father   ? COPD Father   ? Breast cancer Neg Hx   ? ?Social History  ? ?Socioeconomic History  ? Marital status: Single  ?  Spouse name: Not on file  ? Number of children: Not on file  ? Years of education: Not on file  ? Highest education level: Not on file   ?Occupational History  ? Not on file  ?Tobacco Use  ? Smoking status: Every Day  ?  Packs/day: 0.50  ?  Types: Cigarettes  ?  Last attempt to quit: 10/26/2019  ?  Years since quitting: 1.7  ? Smokeless tobacco: Never  ?Vaping Use  ? Vaping Use: Never used  ?Substance and Sexual Activity  ? Alcohol use: Yes  ?  Comment: Occasional  ? Drug use: Never  ? Sexual activity: Not on file  ?Other Topics Concern  ? Not on file  ?Social History Narrative  ? Pt lives with her daughter  ? ?Social Determinants of Health  ? ?Financial Resource Strain: Low Risk   ? Difficulty of Paying Living Expenses: Not hard at all  ?Food Insecurity: No Food Insecurity  ?  Worried About Charity fundraiser in the Last Year: Never true  ? Ran Out of Food in the Last Year: Never true  ?Transportation Needs: No Transportation Needs  ? Lack of Transportation (Medical): No  ? Lack of Transportation (Non-Medical): No  ?Physical Activity: Insufficiently Active  ? Days of Exercise per Week: 7 days  ? Minutes of Exercise per Session: 20 min  ?Stress: No Stress Concern Present  ? Feeling of Stress : Not at all  ?Social Connections: Socially Isolated  ? Frequency of Communication with Friends and Family: More than three times a week  ? Frequency of Social Gatherings with Friends and Family: More than three times a week  ? Attends Religious Services: Never  ? Active Member of Clubs or Organizations: No  ? Attends Archivist Meetings: Never  ? Marital Status: Divorced  ? ? ?Tobacco Counseling ?Ready to quit: Yes ?Counseling given: Yes ? ? ?Clinical Intake: ? ?Pre-visit preparation completed: Yes ? ?Pain : 0-10 ?Pain Score: 8  ?Pain Type: Chronic pain ?Pain Location: Knee (left hip) ?Pain Orientation: Right ?Pain Descriptors / Indicators: Aching, Sore ?Pain Onset: More than a month ago ?Pain Frequency: Constant ? ?  ? ?Nutritional Risks: None ?Diabetes: Yes ?CBG done?: No ?Did pt. bring in CBG monitor from home?: No ? ?How often do you need to have  someone help you when you read instructions, pamphlets, or other written materials from your doctor or pharmacy?: 1 - Never ? ?Nutrition Risk Assessment: ? ?Has the patient had any N/V/D within the last 2 months?  No  ?Does the pat

## 2021-07-17 NOTE — Patient Instructions (Signed)
[name] , ?Thank you for taking time to come for your Medicare Wellness Visit. I appreciate your ongoing commitment to your health goals. Please review the following plan we discussed and let me know if I can assist you in the future.  ? ?Screening recommendations/referrals: ?Colonoscopy: done 02/25/19. Repeat 02/2029 ?Mammogram: done 11/11/20. Please call [phone] to schedule your mammogram.  ?Bone Density: done 01/09/21 ?Recommended yearly ophthalmology/optometry visit for glaucoma screening and checkup ?Recommended yearly dental visit for hygiene and checkup ? ?Vaccinations: ?Influenza vaccine: declined ?Pneumococcal vaccine: done 09/09/19 ?Tdap vaccine: due ?Shingles vaccine: done 12/07/19 & 10/07/20   ?Covid-19:done 09/09/19 & 10/07/19 ? ?Advanced directives: Advance directive discussed with you today. I have provided a copy for you to complete at home and have notarized. Once this is complete please bring a copy in to our office so we can scan it into your chart.  ? ?Conditions/risks identified: If you wish to quit smoking, help is available. For free tobacco cessation program offerings call the [facility] at (570)656-5699 or Live Well Line at [phone]. You may also visit [email or web address] or email [email or web address] for more information on other programs.  ? ?Next appointment: Follow up in one year for your annual wellness visit  ? ? ?Preventive Care 35 Years and Older, Female ?Preventive care refers to lifestyle choices and visits with your health care provider that can promote health and wellness. ?What does preventive care include? ?A yearly physical exam. This is also called an annual well check. ?Dental exams once or twice a year. ?Routine eye exams. Ask your health care provider how often you should have your eyes checked. ?Personal lifestyle choices, including: ?Daily care of your teeth and gums. ?Regular physical activity. ?Eating a healthy diet. ?Avoiding tobacco  and drug use. ?Limiting alcohol use. ?Practicing safe sex. ?Taking low-dose aspirin every day. ?Taking vitamin and mineral supplements as recommended by your health care provider. ?What happens during an annual well check? ?The services and screenings done by your health care provider during your annual well check will depend on your age, overall health, lifestyle risk factors, and family history of disease. ?Counseling  ?Your health care provider may ask you questions about your: ?Alcohol use. ?Tobacco use. ?Drug use. ?Emotional well-being. ?Home and relationship well-being. ?Sexual activity. ?Eating habits. ?History of falls. ?Memory and ability to understand (cognition). ?Work and work Statistician. ?Reproductive health. ?Screening  ?You may have the following tests or measurements: ?Height, weight, and BMI. ?Blood pressure. ?Lipid and cholesterol levels. These may be checked every 5 years, or more frequently if you are over 53 years old. ?Skin check. ?Lung cancer screening. You may have this screening every year starting at age 72 if you have a 30-pack-year history of smoking and currently smoke or have quit within the past 15 years. ?Fecal occult blood test (FOBT) of the stool. You may have this test every year starting at age 68. ?Flexible sigmoidoscopy or colonoscopy. You may have a sigmoidoscopy every 5 years or a colonoscopy every 10 years starting at age 94. ?Hepatitis C blood test. ?Hepatitis B blood test. ?Sexually transmitted disease (STD) testing. ?Diabetes screening. This is done by checking your blood sugar (glucose) after you have not eaten for a while (fasting). You may have this done every 1-3 years. ?Bone density scan. This is done to screen for osteoporosis. You may have this done starting at age 38. ?Mammogram. This may be done every 1-2 years. Talk to your health care provider about how  often you should have regular mammograms. ?Talk with your health care provider about your test results,  treatment options, and if necessary, the need for more tests. ?Vaccines  ?Your health care provider may recommend certain vaccines, such as: ?Influenza vaccine. This is recommended every year. ?Tetanus, diphtheria, and acellular pertussis (Tdap, Td) vaccine. You may need a Td booster every 10 years. ?Zoster vaccine. You may need this after age 68. ?Pneumococcal 13-valent conjugate (PCV13) vaccine. One dose is recommended after age 36. ?Pneumococcal polysaccharide (PPSV23) vaccine. One dose is recommended after age 37. ?Talk to your health care provider about which screenings and vaccines you need and how often you need them. ?This information is not intended to replace advice given to you by your health care provider. Make sure you discuss any questions you have with your health care provider. ?Document Released: 05/06/2015 Document Revised: 12/28/2015 Document Reviewed: 02/08/2015 ?Elsevier Interactive Patient Education ? 2017 [place]. ? ?Fall Prevention in the Home ?Falls can cause injuries. They can happen to people of all ages. There are many things you can do to make your home safe and to help prevent falls. ?What can I do on the outside of my home? ?Regularly fix the edges of walkways and driveways and fix any cracks. ?Remove anything that might make you trip as you walk through a door, such as a raised step or threshold. ?Trim any bushes or trees on the path to your home. ?Use bright outdoor lighting. ?Clear any walking paths of anything that might make someone trip, such as rocks or tools. ?Regularly check to see if handrails are loose or broken. Make sure that both sides of any steps have handrails. ?Any raised decks and porches should have guardrails on the edges. ?Have any leaves, snow, or ice cleared regularly. ?Use sand or salt on walking paths during winter. ?Clean up any spills in your garage right away. This includes oil or grease spills. ?What can I do in the bathroom? ?Use night  lights. ?Install grab bars by the toilet and in the tub and shower. Do not use towel bars as grab bars. ?Use non-skid mats or decals in the tub or shower. ?If you need to sit down in the shower, use a plastic, non-slip stool. ?Keep the floor dry. Clean up any water that spills on the floor as soon as it happens. ?Remove soap buildup in the tub or shower regularly. ?Attach bath mats securely with double-sided non-slip rug tape. ?Do not have throw rugs and other things on the floor that can make you trip. ?What can I do in the bedroom? ?Use night lights. ?Make sure that you have a light by your bed that is easy to reach. ?Do not use any sheets or blankets that are too big for your bed. They should not hang down onto the floor. ?Have a firm chair that has side arms. You can use this for support while you get dressed. ?Do not have throw rugs and other things on the floor that can make you trip. ?What can I do in the kitchen? ?Clean up any spills right away. ?Avoid walking on wet floors. ?Keep items that you use a lot in easy-to-reach places. ?If you need to reach something above you, use a strong step stool that has a grab bar. ?Keep electrical cords out of the way. ?Do not use floor polish or wax that makes floors slippery. If you must use wax, use non-skid floor wax. ?Do not have throw rugs and other things  on the floor that can make you trip. ?What can I do with my stairs? ?Do not leave any items on the stairs. ?Make sure that there are handrails on both sides of the stairs and use them. Fix handrails that are broken or loose. Make sure that handrails are as long as the stairways. ?Check any carpeting to make sure that it is firmly attached to the stairs. Fix any carpet that is loose or worn. ?Avoid having throw rugs at the top or bottom of the stairs. If you do have throw rugs, attach them to the floor with carpet tape. ?Make sure that you have a light switch at the top of the stairs and the bottom of the stairs. If  you do not have them, ask someone to add them for you. ?What else can I do to help prevent falls? ?Wear shoes that: ?Do not have high heels. ?Have rubber bottoms. ?Are comfortable and fit you well. ?Are closed at the t

## 2021-07-27 ENCOUNTER — Telehealth: Payer: Self-pay

## 2021-07-27 NOTE — Telephone Encounter (Signed)
I called pt and left message concerning her mammo appt in [place] on July 25, 23 @ 2:00- also left number for [place] if time doesn't work for her ?

## 2021-08-04 ENCOUNTER — Ambulatory Visit (INDEPENDENT_AMBULATORY_CARE_PROVIDER_SITE_OTHER): Payer: Medicare Other | Admitting: Family Medicine

## 2021-08-04 ENCOUNTER — Ambulatory Visit: Payer: Self-pay | Admitting: *Deleted

## 2021-08-04 ENCOUNTER — Encounter: Payer: Self-pay | Admitting: Family Medicine

## 2021-08-04 VITALS — BP 146/96 | HR 70 | Ht 61.0 in | Wt 135.0 lb

## 2021-08-04 DIAGNOSIS — K130 Diseases of lips: Secondary | ICD-10-CM

## 2021-08-04 DIAGNOSIS — H6012 Cellulitis of left external ear: Secondary | ICD-10-CM | POA: Diagnosis not present

## 2021-08-04 DIAGNOSIS — I10 Essential (primary) hypertension: Secondary | ICD-10-CM

## 2021-08-04 DIAGNOSIS — L739 Follicular disorder, unspecified: Secondary | ICD-10-CM | POA: Diagnosis not present

## 2021-08-04 DIAGNOSIS — Z22322 Carrier or suspected carrier of Methicillin resistant Staphylococcus aureus: Secondary | ICD-10-CM

## 2021-08-04 MED ORDER — MUPIROCIN 2 % EX OINT: 1.0000 "application " | TOPICAL_OINTMENT | Freq: Two times a day (BID) | CUTANEOUS | 0 refills | Status: DC

## 2021-08-04 MED ORDER — AMLODIPINE BESYLATE 2.5 MG PO TABS: 2.5000 mg | ORAL_TABLET | Freq: Every day | ORAL | 0 refills | Status: DC

## 2021-08-04 MED ORDER — AMOXICILLIN-POT CLAVULANATE 875-125 MG PO TABS: 1.0000 | ORAL_TABLET | Freq: Two times a day (BID) | ORAL | 0 refills | Status: DC

## 2021-08-04 NOTE — Progress Notes (Signed)
? ? ?Date:  08/04/2021  ? ?Name:  Mary Nicholson   DOB:  07-27-1955   MRN:  [id number] ? ? ?Chief Complaint: MRSA infection (Place on lip, ear and abdomen. Hx of "MRSA" infections) ? ?HPI ? ?Lab Results  ?Component Value Date  ? NA 143 05/22/2021  ? K 4.7 05/22/2021  ? CO2 23 05/22/2021  ? GLUCOSE 56 (L) 05/22/2021  ? BUN 12 05/22/2021  ? CREATININE 0.64 05/22/2021  ? CALCIUM 10.1 05/22/2021  ? EGFR 97 05/22/2021  ? GFRNONAA >60 10/02/2020  ? ?Lab Results  ?Component Value Date  ? CHOL 144 05/22/2021  ? HDL 45 05/22/2021  ? [place] 62 05/22/2021  ? TRIG 231 (H) 05/22/2021  ? ?Lab Results  ?Component Value Date  ? TSH 3.790 05/22/2021  ? ?Lab Results  ?Component Value Date  ? HGBA1C 7.3 (H) 05/31/2021  ? ?Lab Results  ?Component Value Date  ? WBC 11.9 (H) 10/02/2020  ? HGB 13.2 10/02/2020  ? HCT 40.2 10/02/2020  ? MCV 89.5 10/02/2020  ? PLT 330 10/02/2020  ? ?Lab Results  ?Component Value Date  ? ALT 21 07/22/2020  ? AST 16 07/22/2020  ? ALKPHOS 103 07/22/2020  ? BILITOT 0.3 07/22/2020  ? ?No results found for: 25OHVITD2, [place], VD25OH  ? ?Review of Systems  ?Constitutional: Negative.  Negative for chills, fatigue, fever and unexpected weight change.  ?HENT:  Negative for congestion, ear discharge, ear pain, rhinorrhea, sinus pressure, sneezing and sore throat.   ?Respiratory:  Negative for cough, shortness of breath, wheezing and stridor.   ?Gastrointestinal:  Negative for abdominal pain, blood in stool, constipation, diarrhea and nausea.  ?Genitourinary:  Negative for dysuria, flank pain, frequency, hematuria, urgency and vaginal discharge.  ?Musculoskeletal:  Negative for arthralgias, back pain and myalgias.  ?Skin:  Negative for rash.  ?Neurological:  Negative for dizziness, weakness and headaches.  ?Hematological:  Negative for adenopathy. Does not bruise/bleed easily.  ?Psychiatric/Behavioral:  Negative for dysphoric mood. The patient is not nervous/anxious.   ? ?Patient Active Problem List  ?  Diagnosis Date Noted  ? 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 Chronic radicular lumbar pain 05/04/2021  ? History of lumbar fusion 05/04/2021  ? Chronic pain syndrome 05/04/2021  ? Bipolar affective disorder, mixed ([place]) 11/26/2019  ? COPD (chronic obstructive pulmonary disease) ([place]) 11/26/2019  ? PTSD (post-traumatic stress disorder) 11/26/2019  ? Schizophrenia ([place]) 11/26/2019  ? Abdominal wall cellulitis 11/12/2019  ? Abdominal wall abscess   ? Type 2 diabetes mellitus with diabetic neuropathy, without long-term current use of insulin ([place])   ? Hyperlipidemia   ? Bilateral hand numbness 09/16/2019  ? S/P hysterectomy 09/09/2019  ? Stage 3b chronic kidney disease ([place]) 09/09/2019  ? Cervical radiculitis 08/13/2019  ? Chronic bilateral low back pain with right-sided sciatica 08/13/2019  ? Lumbar radiculopathy 08/13/2019  ? Neck pain 08/13/2019  ? Numbness and tingling of both feet 07/30/2019  ? Dupuytren contracture 05/22/2019  ? Personal history of transient ischemic attack (TIA), and cerebral infarction without residual deficits 05/22/2019  ? Resting tremor 05/22/2019  ? Diverticulosis 05/20/2019  ? External hemorrhoid 05/20/2019  ? Abnormal cervical Papanicolaou smear 12/19/2018  ? Arthralgia of both hands 12/19/2018  ? Breast pain, right 12/19/2018  ? Diarrhea 12/19/2018  ? Dysphagia 12/19/2018  ? Paresthesias 12/19/2018  ? Seasonal allergies 12/19/2018  ? Gastroesophageal reflux disease 09/23/2015  ? Spinal stenosis of lumbar region 05/04/2015  ? Allergic rhinitis 11/28/2014  ? Acquired hypothyroidism 09/13/2014  ? Essential  hypertension 09/13/2014  ? Type 2 diabetes mellitus with diabetic neuropathy ([place]) 09/13/2014  ? ? ?No Known Allergies ? ?Past Surgical History:  ?Procedure Laterality Date  ? ABDOMINAL HYSTERECTOMY    ? BACK SURGERY    ? BREAST BIOPSY Right   ? negative 1988  ? CHOLECYSTECTOMY    ? ? ?Social History  ? ?Tobacco Use  ? Smoking status: Every Day  ?  Packs/day: 0.50  ?  Types:  Cigarettes  ?  Last attempt to quit: 10/26/2019  ?  Years since quitting: 1.7  ? Smokeless tobacco: Never  ?Vaping Use  ? Vaping Use: Never used  ?Substance Use Topics  ? Alcohol use: Yes  ?  Comment: Occasional  ? Drug use: Never  ? ? ? ?Medication list has been reviewed and updated. ? ?Current Meds  ?Medication Sig  ? Acetaminophen (TYLENOL ARTHRITIS EXT RELIEF PO) Take 1 capsule by mouth 3 (three) times daily.  ? albuterol (VENTOLIN HFA) 108 (90 Base) MCG/ACT inhaler INHALE 1-2 PUFFS BY MOUTH EVERY 6 HOURS AS NEEDED FOR WHEEZE OR SHORTNESS OF BREATH  ? amLODipine (NORVASC) 2.5 MG tablet Take 1 tablet (2.5 mg total) by mouth daily.  ? amoxicillin-clavulanate (AUGMENTIN) 875-125 MG tablet Take 1 tablet by mouth 2 (two) times daily.  ? aspirin 81 MG chewable tablet Chew by mouth.  ? atorvastatin (LIPITOR) 80 MG tablet Take 1 tablet (80 mg total) by mouth daily.  ? b complex vitamins capsule Take 1 capsule by mouth daily.  ? Biotin 5 MG CAPS Take 1 capsule by mouth daily.  ? busPIRone (BUSPAR) 15 MG tablet Take 15 mg by mouth 3 (three) times daily.  ? Cetirizine HCl (ALLERGY RELIEF) 10 MG CAPS Take 1 capsule by mouth daily.  ? famotidine (PEPCID) 40 MG tablet Take 1 tablet (40 mg total) by mouth daily.  ? fluticasone (FLONASE) 50 MCG/ACT nasal spray Place 2 sprays into both nostrils daily.  ? Fluticasone-Umeclidin-Vilant (TRELEGY ELLIPTA) 100-62.5-25 MCG/INH AEPB Inhale 1 puff into the lungs daily.  ? glipiZIDE (GLUCOTROL XL) 10 MG 24 hr tablet Take 1 tablet (10 mg total) by mouth daily.  ? hydrochlorothiazide (HYDRODIURIL) 25 MG tablet Take 1 tablet (25 mg total) by mouth daily.  ? hydrOXYzine (ATARAX) 25 MG tablet Take 25 mg by mouth 2 (two) times daily.  ? lamoTRIgine (LAMICTAL) 200 MG tablet Take 200 mg by mouth 2 (two) times daily.  ? levothyroxine (SYNTHROID) 100 MCG tablet Take 1 tablet (100 mcg total) by mouth daily.  ? metFORMIN (GLUCOPHAGE-XR) 500 MG 24 hr tablet Take 2 tablets (1,000 mg total) by mouth 2  (two) times daily.  ? metoprolol succinate (TOPROL-XL) 50 MG 24 hr tablet Take 1 tablet (50 mg total) by mouth daily.  ? Multiple Vitamins-Minerals (CENTRUM SILVER PO) Take 1 each by mouth daily.  ? mupirocin ointment (BACTROBAN) 2 % Apply 1 application. topically 2 (two) times daily.  ? ONETOUCH ULTRA test strip 3 (three) times daily.  ? SM IRON 325 (65 Fe) MG tablet Take by mouth.  ? traZODone (DESYREL) 50 MG tablet Take 50 mg by mouth at bedtime as needed.  ? venlafaxine XR (EFFEXOR-XR) 75 MG 24 hr capsule Take 150 mg by mouth in the morning and at bedtime.   ? ? ? ?  05/22/2021  ?  4:29 PM 10/07/2020  ?  2:26 PM 07/14/2020  ?  2:26 PM  ?GAD 7 : Generalized Anxiety Score  ?Nervous, Anxious, on Edge 0 1 2  ?Control/stop  worrying 0 0 3  ?Worry too much - different things 0 0 3  ?Trouble relaxing 0 0 2  ?Restless 0 0 1  ?Easily annoyed or irritable 0 0 2  ?Afraid - awful might happen 0 0 0  ?Total GAD 7 Score 0 1 13  ?Anxiety Difficulty Not difficult at all Not difficult at all Very difficult  ? ? ? ?  07/17/2021  ?  3:13 PM  ?Depression screen PHQ 2/9  ?Decreased Interest 0  ?Down, Depressed, Hopeless 0  ?PHQ - 2 Score 0  ?Altered sleeping 0  ?Tired, decreased energy 0  ?Change in appetite 0  ?Feeling bad or failure about yourself  0  ?Trouble concentrating 0  ?Moving slowly or fidgety/restless 1  ?Suicidal thoughts 0  ?PHQ-9 Score 1  ?Difficult doing work/chores Not difficult at all  ? ? ?BP Readings from Last 3 Encounters:  ?08/04/21 (!) 146/96  ?07/10/21 (!) 159/77  ?05/22/21 138/80  ? ? ?Physical Exam ?Vitals and nursing note reviewed.  ?Constitutional:   ?   Appearance: She is well-developed.  ?HENT:  ?   Head: Normocephalic.  ?   Right Ear: Hearing, tympanic membrane, ear canal and external ear normal.  ?   Left Ear: Hearing, tympanic membrane, ear canal and external ear normal. Swelling present.  ?   Ears:  ?   Comments: Erythema/swelling  ?Eyes:  ?   General: Lids are everted, no foreign bodies appreciated. No  scleral icterus.    ?   Left eye: No foreign body or hordeolum.  ?   Conjunctiva/sclera: Conjunctivae normal.  ?   Right eye: Right conjunctiva is not injected.  ?   Left eye: Left conjunctiva is not injected.  ?   Pu

## 2021-08-04 NOTE — Telephone Encounter (Signed)
Reason for Disposition ? Swelling is painful to touch ? [1] Looks infected AND [2] large red area (> 2 in. or 5 cm) ? ?Answer Assessment - Initial Assessment Questions ?1. ONSET: "When did the swelling start?" (e.g., minutes, hours, days) ?    This happened a year ago   My bottom lip is swollen from a pimple that started under my lip.   It looks like it ruptured in my sleep last night.   There was pus on my pillow this morning.   I had surgery to get the infection out when I had this last time and was in the hospital.   It was a staph infection I think they called it.   ?The last 3 days it's started back the pain and swelling of pimple under my lower lip.    It started as a pimple under my lip.   Now it's larger and larger.    My left  is closed up behind my ear and down my neck is swollen.  There are also 2 blisters under my left breast.   ?It's  a skin disease.   It's like a staph infection I had before.    ? ?My left ear is swollen.   ?My left eye is swollen and has mucus in it.   No swelling inside my mouth. ?2. SEVERITY: "How swollen is it?" ?    The pimple looking place under my lip is where it started.   ?3. ITCHING: "Is there any itching?" If Yes, ask: "How much?"   (Scale 1-10; mild, moderate or severe) ?    No ?4. PAIN: "Is the swelling painful to touch?" If Yes, ask: "How painful is it?"   (Scale 1-10; mild, moderate or severe) ?    Painful in all the places that are swollen. ?5. CAUSE: "What do you think is causing the lip swelling?" ?    Possibly a staph infection which I've had before a yr ago ?6. RECURRENT SYMPTOM: "Have you had lip swelling before?" If Yes, ask: "When was the last time?" "What happened that time?" ?    Yes ?7. OTHER SYMPTOMS: "Do you have any other symptoms?" (e.g., toothache) ?    See above ?8. PREGNANCY: "Is there any chance you are pregnant?" "When was your last menstrual period?" ?    N/A ? ?Protocols used: Lip Swelling-A-AH ? ?

## 2021-08-04 NOTE — Telephone Encounter (Signed)
?  Chief Complaint: Pimple under lower lip is looking infected.   Looks like a return of a staph infection like she had last yr and was in the hospital for. ?Symptoms: Left ear and eye are swollen, glands down left side of neck swollen and tender, pimple under lower lip has pus coming from it along with lower lip being swollen.    2 sores under her left breast ?Frequency: Over the last 3 days it's gotten worse ?Pertinent Negatives: Patient denies fever ?Disposition: '[]'$ ED /'[]'$ Urgent Care (no appt availability in office) / '[x]'$ Appointment(In office/virtual)/ '[]'$  [facility]/ '[]'$ Home Care/ '[]'$ Refused Recommended Disposition /'[]'$ [facility]/ '[]'$  Follow-up with PCP ?Additional Notes: scheduled with Dr. Ronnald Ramp for today.   ?

## 2021-08-07 ENCOUNTER — Encounter: Payer: Self-pay | Admitting: Gastroenterology

## 2021-08-08 ENCOUNTER — Encounter
Admission: RE | Disposition: A | Discharge status: Home / Self Care | Payer: Self-pay | Source: Home / Self Care | Attending: Gastroenterology

## 2021-08-08 ENCOUNTER — Ambulatory Visit: Payer: Medicare Other | Admitting: Certified Registered"

## 2021-08-08 ENCOUNTER — Ambulatory Visit
Admission: RE | Admit: 2021-08-08 | Discharge: 2021-08-08 | Disposition: A | Discharge status: Home / Self Care | Payer: Medicare Other | Attending: Gastroenterology | Admitting: Gastroenterology

## 2021-08-08 ENCOUNTER — Encounter: Payer: Self-pay | Admitting: Gastroenterology

## 2021-08-08 DIAGNOSIS — R0600 Dyspnea, unspecified: Secondary | ICD-10-CM | POA: Diagnosis not present

## 2021-08-08 DIAGNOSIS — J449 Chronic obstructive pulmonary disease, unspecified: Secondary | ICD-10-CM | POA: Diagnosis not present

## 2021-08-08 DIAGNOSIS — F17210 Nicotine dependence, cigarettes, uncomplicated: Secondary | ICD-10-CM | POA: Diagnosis not present

## 2021-08-08 DIAGNOSIS — Z8249 Family history of ischemic heart disease and other diseases of the circulatory system: Secondary | ICD-10-CM | POA: Diagnosis not present

## 2021-08-08 DIAGNOSIS — F4310 Post-traumatic stress disorder, unspecified: Secondary | ICD-10-CM | POA: Insufficient documentation

## 2021-08-08 DIAGNOSIS — R1310 Dysphagia, unspecified: Secondary | ICD-10-CM | POA: Diagnosis not present

## 2021-08-08 DIAGNOSIS — Z9049 Acquired absence of other specified parts of digestive tract: Secondary | ICD-10-CM | POA: Insufficient documentation

## 2021-08-08 DIAGNOSIS — E119 Type 2 diabetes mellitus without complications: Secondary | ICD-10-CM | POA: Insufficient documentation

## 2021-08-08 DIAGNOSIS — K259 Gastric ulcer, unspecified as acute or chronic, without hemorrhage or perforation: Secondary | ICD-10-CM | POA: Insufficient documentation

## 2021-08-08 DIAGNOSIS — E039 Hypothyroidism, unspecified: Secondary | ICD-10-CM | POA: Diagnosis not present

## 2021-08-08 DIAGNOSIS — K219 Gastro-esophageal reflux disease without esophagitis: Secondary | ICD-10-CM | POA: Diagnosis not present

## 2021-08-08 DIAGNOSIS — M4800 Spinal stenosis, site unspecified: Secondary | ICD-10-CM | POA: Diagnosis not present

## 2021-08-08 DIAGNOSIS — Z8541 Personal history of malignant neoplasm of cervix uteri: Secondary | ICD-10-CM | POA: Diagnosis not present

## 2021-08-08 DIAGNOSIS — Z7984 Long term (current) use of oral hypoglycemic drugs: Secondary | ICD-10-CM | POA: Insufficient documentation

## 2021-08-08 DIAGNOSIS — K0889 Other specified disorders of teeth and supporting structures: Secondary | ICD-10-CM | POA: Diagnosis not present

## 2021-08-08 DIAGNOSIS — I10 Essential (primary) hypertension: Secondary | ICD-10-CM | POA: Diagnosis not present

## 2021-08-08 DIAGNOSIS — K253 Acute gastric ulcer without hemorrhage or perforation: Secondary | ICD-10-CM

## 2021-08-08 DIAGNOSIS — F209 Schizophrenia, unspecified: Secondary | ICD-10-CM | POA: Insufficient documentation

## 2021-08-08 DIAGNOSIS — F319 Bipolar disorder, unspecified: Secondary | ICD-10-CM | POA: Diagnosis not present

## 2021-08-08 DIAGNOSIS — K222 Esophageal obstruction: Secondary | ICD-10-CM

## 2021-08-08 DIAGNOSIS — Z8711 Personal history of peptic ulcer disease: Secondary | ICD-10-CM | POA: Diagnosis not present

## 2021-08-08 DIAGNOSIS — E785 Hyperlipidemia, unspecified: Secondary | ICD-10-CM | POA: Diagnosis not present

## 2021-08-08 DIAGNOSIS — R1319 Other dysphagia: Secondary | ICD-10-CM | POA: Diagnosis not present

## 2021-08-08 DIAGNOSIS — Z8673 Personal history of transient ischemic attack (TIA), and cerebral infarction without residual deficits: Secondary | ICD-10-CM | POA: Diagnosis not present

## 2021-08-08 DIAGNOSIS — Z833 Family history of diabetes mellitus: Secondary | ICD-10-CM | POA: Diagnosis not present

## 2021-08-08 HISTORY — DX: Chronic obstructive pulmonary disease, unspecified: J44.9

## 2021-08-08 HISTORY — DX: Cerebral infarction, unspecified: I63.9

## 2021-08-08 HISTORY — DX: Dyspnea, unspecified: R06.00

## 2021-08-08 HISTORY — DX: Gastro-esophageal reflux disease without esophagitis: K21.9

## 2021-08-08 HISTORY — PX: ESOPHAGOGASTRODUODENOSCOPY (EGD) WITH PROPOFOL: SHX5813

## 2021-08-08 LAB — GLUCOSE, CAPILLARY: Glucose-Capillary: 135 mg/dL — ABNORMAL HIGH (ref 70–99)

## 2021-08-08 SURGERY — ESOPHAGOGASTRODUODENOSCOPY (EGD) WITH PROPOFOL
Anesthesia: General

## 2021-08-08 MED ORDER — GLYCOPYRROLATE 0.2 MG/ML IJ SOLN
INTRAMUSCULAR | Status: DC | PRN
Start: 2021-08-08 — End: 2021-08-08
  Administered 2021-08-08: .2 mg via INTRAVENOUS

## 2021-08-08 MED ORDER — SODIUM CHLORIDE 0.9 % IV SOLN: INTRAVENOUS | Status: DC

## 2021-08-08 MED ORDER — PROPOFOL 10 MG/ML IV BOLUS
INTRAVENOUS | Status: DC | PRN
  Administered 2021-08-08: 10 mg via INTRAVENOUS
  Administered 2021-08-08: 60 mg via INTRAVENOUS
  Administered 2021-08-08: 10 mg via INTRAVENOUS

## 2021-08-08 MED ORDER — PROPOFOL 500 MG/50ML IV EMUL
INTRAVENOUS | Status: DC | PRN
  Administered 2021-08-08: 146 ug/kg/min via INTRAVENOUS

## 2021-08-08 NOTE — Op Note (Signed)
[facility] ?Gastroenterology ?Patient Name: Mary Nicholson ?Procedure Date: 08/08/2021 9:14 AM ?MRN: [id number] ?Account #: [id number] ?Date of Birth: [birth year] ?Admit Type: Outpatient ?Age: 66 ?Room: [facility] ENDO ROOM 4 ?Gender: Female ?Note Status: Finalized ?Instrument Name: Upper Endoscope [id number] ?Procedure:             Upper GI endoscopy ?Indications:           Dysphagia ?Providers:             Lucilla Lame MD, MD ?Referring MD:          Juline Patch, MD (Referring MD) ?Medicines:             Propofol per Anesthesia ?Complications:         No immediate complications. ?Procedure:             Pre-Anesthesia Assessment: ?                       - Prior to the procedure, a History and Physical was  ?                       performed, and patient medications and allergies were  ?                       reviewed. The patient's tolerance of previous  ?                       anesthesia was also reviewed. The risks and benefits  ?                       of the procedure and the sedation options and risks  ?                       were discussed with the patient. All questions were  ?                       answered, and informed consent was obtained. Prior  ?                       Anticoagulants: The patient has taken no previous  ?                       anticoagulant or antiplatelet agents. ASA Grade  ?                       Assessment: II - A patient with mild systemic disease.  ?                       After reviewing the risks and benefits, the patient  ?                       was deemed in satisfactory condition to undergo the  ?                       procedure. ?                       After obtaining informed consent, the endoscope was  ?  passed under direct vision. Throughout the procedure,  ?                       the patient's blood pressure, pulse, and oxygen  ?                       saturations were monitored continuously. The  ?                       Endosonoscope  was introduced through the mouth, and  ?                       advanced to the second part of duodenum. The upper GI  ?                       endoscopy was accomplished without difficulty. The  ?                       patient tolerated the procedure well. ?Findings: ?     One benign-appearing, intrinsic mild stenosis was found at the  ?     gastroesophageal junction. The stenosis was traversed. A TTS dilator was  ?     passed through the scope. Dilation with a 15-16.5-18 mm balloon dilator  ?     was performed to 18 mm. The dilation site was examined following  ?     endoscope reinsertion and showed complete resolution of luminal  ?     narrowing. ?     One non-bleeding linear gastric ulcer with no stigmata of bleeding was  ?     found in the gastric body. ?     The examined duodenum was normal. ?Impression:            - Benign-appearing esophageal stenosis. Dilated. ?                       - Non-bleeding gastric ulcer with no stigmata of  ?                       bleeding. ?                       - Normal examined duodenum. ?                       - No specimens collected. ?Recommendation:        - Discharge patient to home. ?                       - Resume previous diet. ?                       - Continue present medications. ?Procedure Code(s):     --- Professional --- ?                       [phone], Esophagogastroduodenoscopy, flexible,  ?                       transoral; with transendoscopic balloon dilation of  ?                       esophagus (less  than 30 mm diameter) ?Diagnosis Code(s):     --- Professional --- ?                       R13.10, Dysphagia, unspecified ?                       K25.9, Gastric ulcer, unspecified as acute or chronic,  ?                       without hemorrhage or perforation ?                       K22.2, Esophageal obstruction ?CPT copyright 2019 American Medical Association. All rights reserved. ?The codes documented in this report are preliminary and upon coder review may  ?be  revised to meet current compliance requirements. ?Lucilla Lame MD, MD ?08/08/2021 9:32:16 AM ?This report has been signed electronically. ?Number of Addenda: 0 ?Note Initiated On: 08/08/2021 9:14 AM ?Estimated Blood Loss:  Estimated blood loss: none. ?     [facility] ?

## 2021-08-08 NOTE — Transfer of Care (Signed)
Immediate Anesthesia Transfer of Care Note ? ?Patient: Mary Nicholson ? ?Procedure(s) Performed: ESOPHAGOGASTRODUODENOSCOPY (EGD) WITH PROPOFOL ? ?Patient Location: Endoscopy Unit ? ?Anesthesia Type:General ? ?Level of Consciousness: drowsy and patient cooperative ? ?Airway & Oxygen Therapy: Patient Spontanous Breathing and Patient connected to face mask oxygen ? ?Post-op Assessment: Report given to RN and Post -op Vital signs reviewed and stable ? ?Post vital signs: Reviewed and stable ? ?Last Vitals:  ?Vitals Value Taken Time  ?BP 132/68 08/08/21 0934  ?Temp 35.7 ?C 08/08/21 0934  ?Pulse 67 08/08/21 0936  ?Resp 19 08/08/21 0936  ?SpO2 100 % 08/08/21 0936  ?Vitals shown include unvalidated device data. ? ?Last Pain:  ?Vitals:  ? 08/08/21 0934  ?TempSrc: Temporal  ?PainSc: Asleep  ?   ? ?  ? ?Complications: No notable events documented. ?

## 2021-08-08 NOTE — H&P (Signed)
? ?Lucilla Lame, MD [facility] ?[address] ?[place], [place] [address] ?Phone:[phone] ?Fax : [phone] ? ?Primary Care Physician:  Juline Patch, MD ?Primary Gastroenterologist:  Dr. Allen Norris ? ?Pre-Procedure History & Physical: ?HPI:  Mary Nicholson is a 66 y.o. female is here for an endoscopy. ?  ?Past Medical History:  ?Diagnosis Date  ? Bipolar 1 disorder ([place])   ? Cancer [facility])   ? cervical  ? COPD (chronic obstructive pulmonary disease) ([place])   ? Diabetes mellitus without complication ([place])   ? Dyspnea   ? GERD (gastroesophageal reflux disease)   ? Hx of right breast biopsy 1988  ? RIGHT excisional bx 1988  ? Hyperlipidemia   ? Hypertension   ? PTSD (post-traumatic stress disorder)   ? Schizophrenia ([place])   ? Spinal stenosis   ? Stroke [facility])   ? mild 2013  ? Thyroid disease   ? ? ?Past Surgical History:  ?Procedure Laterality Date  ? ABDOMINAL HYSTERECTOMY    ? BACK SURGERY    ? BREAST BIOPSY Right   ? negative 1988  ? CHOLECYSTECTOMY    ? ? ?Prior to Admission medications   ?Medication Sig Start Date End Date Taking? Authorizing Provider  ?albuterol (VENTOLIN HFA) 108 (90 Base) MCG/ACT inhaler INHALE 1-2 PUFFS BY MOUTH EVERY 6 HOURS AS NEEDED FOR WHEEZE OR SHORTNESS OF BREATH 05/22/21  Yes Juline Patch, MD  ?amLODipine (NORVASC) 2.5 MG tablet Take 1 tablet (2.5 mg total) by mouth daily. 08/04/21  Yes Juline Patch, MD  ?aspirin 81 MG chewable tablet Chew by mouth.   Yes [provider]  ?atorvastatin (LIPITOR) 80 MG tablet Take 1 tablet (80 mg total) by mouth daily. 05/22/21  Yes Juline Patch, MD  ?busPIRone (BUSPAR) 15 MG tablet Take 15 mg by mouth 3 (three) times daily. 08/27/19  Yes [provider]  ?glipiZIDE (GLUCOTROL XL) 10 MG 24 hr tablet Take 1 tablet (10 mg total) by mouth daily. 05/22/21  Yes Juline Patch, MD  ?hydrochlorothiazide (HYDRODIURIL) 25 MG tablet Take 1 tablet (25 mg total) by mouth daily. 05/22/21  Yes Juline Patch, MD  ?hydrOXYzine (ATARAX) 25  MG tablet Take 25 mg by mouth 2 (two) times daily. 07/17/21  Yes [provider]  ?lamoTRIgine (LAMICTAL) 200 MG tablet Take 200 mg by mouth 2 (two) times daily. 10/30/19  Yes [provider]  ?levothyroxine (SYNTHROID) 100 MCG tablet Take 1 tablet (100 mcg total) by mouth daily. 05/22/21  Yes Juline Patch, MD  ?metFORMIN (GLUCOPHAGE-XR) 500 MG 24 hr tablet Take 2 tablets (1,000 mg total) by mouth 2 (two) times daily. 05/22/21  Yes Juline Patch, MD  ?metoprolol succinate (TOPROL-XL) 50 MG 24 hr tablet Take 1 tablet (50 mg total) by mouth daily. 05/22/21  Yes Juline Patch, MD  ?SM IRON 325 (65 Fe) MG tablet Take by mouth. 06/24/19  Yes [provider]  ?venlafaxine XR (EFFEXOR-XR) 75 MG 24 hr capsule Take 150 mg by mouth in the morning and at bedtime.  10/02/19  Yes [provider]  ?Acetaminophen (TYLENOL ARTHRITIS EXT RELIEF PO) Take 1 capsule by mouth 3 (three) times daily.    [provider]  ?amoxicillin-clavulanate (AUGMENTIN) 875-125 MG tablet Take 1 tablet by mouth 2 (two) times daily. 08/04/21   Juline Patch, MD  ?b complex vitamins capsule Take 1 capsule by mouth daily.    [provider]  ?Biotin 5 MG CAPS Take 1 capsule by  mouth daily.    [provider]  ?Cetirizine HCl (ALLERGY RELIEF) 10 MG CAPS Take 1 capsule by mouth daily.    [provider]  ?famotidine (PEPCID) 40 MG tablet Take 1 tablet (40 mg total) by mouth daily. 05/22/21   Juline Patch, MD  ?fluticasone (FLONASE) 50 MCG/ACT nasal spray Place 2 sprays into both nostrils daily. 05/22/21   Juline Patch, MD  ?Fluticasone-Umeclidin-Vilant (TRELEGY ELLIPTA) 100-62.5-25 MCG/INH AEPB Inhale 1 puff into the lungs daily. 07/22/20   Juline Patch, MD  ?Multiple Vitamins-Minerals (CENTRUM SILVER PO) Take 1 each by mouth daily.    [provider]  ?mupirocin ointment (BACTROBAN) 2 % Apply 1 application. topically 2 (two) times daily. 08/04/21   Juline Patch, MD   ?[facility] ULTRA test strip 3 (three) times daily. 11/01/19   [provider]  ?traZODone (DESYREL) 50 MG tablet Take 50 mg by mouth at bedtime as needed. 11/11/19   [provider]  ? ? ?Allergies as of 07/10/2021  ? (No Known Allergies)  ? ? ?Family History  ?Problem Relation Age of Onset  ? Diabetes Mother   ? Heart failure Mother   ? Ovarian cancer Mother 42  ?     question dx?  ? Alzheimer's disease Father   ? COPD Father   ? Breast cancer Neg Hx   ? ? ?Social History  ? ?Socioeconomic History  ? Marital status: Single  ?  Spouse name: Not on file  ? Number of children: Not on file  ? Years of education: Not on file  ? Highest education level: Not on file  ?Occupational History  ? Not on file  ?Tobacco Use  ? Smoking status: Every Day  ?  Packs/day: 0.50  ?  Types: Cigarettes  ?  Last attempt to quit: 10/26/2019  ?  Years since quitting: 1.7  ? Smokeless tobacco: Never  ?Vaping Use  ? Vaping Use: Never used  ?Substance and Sexual Activity  ? Alcohol use: Yes  ?  Comment: Occasional  ? Drug use: Never  ? Sexual activity: Not on file  ?Other Topics Concern  ? Not on file  ?Social History Narrative  ? Pt lives with her daughter  ? ?Social Determinants of Health  ? ?Financial Resource Strain: Low Risk   ? Difficulty of Paying Living Expenses: Not hard at all  ?Food Insecurity: No Food Insecurity  ? Worried About Charity fundraiser in the Last Year: Never true  ? Ran Out of Food in the Last Year: Never true  ?Transportation Needs: No Transportation Needs  ? Lack of Transportation (Medical): No  ? Lack of Transportation (Non-Medical): No  ?Physical Activity: Insufficiently Active  ? Days of Exercise per Week: 7 days  ? Minutes of Exercise per Session: 20 min  ?Stress: No Stress Concern Present  ? Feeling of Stress : Not at all  ?Social Connections: Socially Isolated  ? Frequency of Communication with Friends and Family: More than three times a week  ? Frequency of Social Gatherings with Friends and  Family: More than three times a week  ? Attends Religious Services: Never  ? Active Member of Clubs or Organizations: No  ? Attends Archivist Meetings: Never  ? Marital Status: Divorced  ?Intimate Partner Violence: Not At Risk  ? Fear of Current or Ex-Partner: No  ? Emotionally Abused: No  ? Physically Abused: No  ? Sexually Abused: No  ? ? ?Review of Systems: ?See  HPI, otherwise negative ROS ? ?Physical Exam: ?BP (!) 138/93   Pulse 72   Temp 98.1 ?F (36.7 ?C) (Temporal)   Resp 20   Ht '5\' 1"'$  (1.549 m)   Wt 61.2 kg   SpO2 98%   BMI 25.51 kg/m?  ?General:   Alert,  pleasant and cooperative in NAD ?Head:  Normocephalic and atraumatic. ?Neck:  Supple; no masses or thyromegaly. ?Lungs:  Clear throughout to auscultation.    ?Heart:  Regular rate and rhythm. ?Abdomen:  Soft, nontender and nondistended. Normal bowel sounds, without guarding, and without rebound.   ?Neurologic:  Alert and  oriented x4;  grossly normal neurologically. ? ?Impression/Plan: ?Mary Nicholson is here for an endoscopy to be performed for dysphagia ? ?Risks, benefits, limitations, and alternatives regarding  endoscopy have been reviewed with the patient.  Questions have been answered.  All parties agreeable. ? ? ?Lucilla Lame, MD  08/08/2021, 9:13 AM ?

## 2021-08-08 NOTE — Anesthesia Procedure Notes (Signed)
Procedure Name: Intubation ?Date/Time: 08/08/2021 9:28 AM ?Performed by: Kelton Pillar, CRNA ?Pre-anesthesia Checklist: Patient identified, Emergency Drugs available, Suction available and Patient being monitored ?Patient Re-evaluated:Patient Re-evaluated prior to induction ?Oxygen Delivery Method: Simple face mask ?Induction Type: IV induction ?Placement Confirmation: positive ETCO2, breath sounds checked- equal and bilateral and CO2 detector ?Dental Injury: Teeth and Oropharynx as per pre-operative assessment  ? ? ? ? ?

## 2021-08-09 ENCOUNTER — Encounter: Payer: Self-pay | Admitting: Gastroenterology

## 2021-08-09 NOTE — Anesthesia Preprocedure Evaluation (Signed)
Anesthesia Evaluation  ?Patient identified by MRN, date of birth, ID band ?Patient awake ? ? ? ?Reviewed: ?Allergy & Precautions, H&P , NPO status , Patient's Chart, lab work & pertinent test results, reviewed documented beta blocker date and time  ? ?Airway ?Mallampati: II ? ? ?Neck ROM: full ? ? ? Dental ? ?(+) Poor Dentition ?  ?Pulmonary ?shortness of breath, COPD, Current Smoker,  ?  ?Pulmonary exam normal ? ? ? ? ? ? ? Cardiovascular ?hypertension, negative cardio ROS ?Normal cardiovascular exam ?Rhythm:regular Rate:Normal ? ? ?  ?Neuro/Psych ? Neuromuscular disease CVA negative psych ROS  ? GI/Hepatic ?Neg liver ROS, PUD, GERD  ,  ?Endo/Other  ?diabetesHypothyroidism  ? Renal/GU ?Renal disease  ?negative genitourinary ?  ?Musculoskeletal ? ? Abdominal ?  ?Peds ? Hematology ?negative hematology ROS ?(+)   ?Anesthesia Other Findings ?Past Medical History: ?No date: Bipolar 1 disorder ([place]) ?No date: Cancer [facility]) ?    Comment:  cervical ?No date: COPD (chronic obstructive pulmonary disease) ([place]) ?No date: Diabetes mellitus without complication ([place]) ?No date: Dyspnea ?No date: GERD (gastroesophageal reflux disease) ?1988: Hx of right breast biopsy ?    Comment:  RIGHT excisional bx 1988 ?No date: Hyperlipidemia ?No date: Hypertension ?No date: PTSD (post-traumatic stress disorder) ?No date: Schizophrenia [facility]) ?No date: Spinal stenosis ?No date: Stroke [facility]) ?    Comment:  mild 2013 ?No date: Thyroid disease ?Past Surgical History: ?No date: ABDOMINAL HYSTERECTOMY ?No date: BACK SURGERY ?No date: BREAST BIOPSY; Right ?    Comment:  negative 1988 ?No date: CHOLECYSTECTOMY ?08/08/2021: ESOPHAGOGASTRODUODENOSCOPY (EGD) WITH PROPOFOL; N/A ?    Comment:  Procedure: ESOPHAGOGASTRODUODENOSCOPY (EGD) WITH  ?             PROPOFOL;  Surgeon: Lucilla Lame, MD;  Location: [facility]  ?             ENDOSCOPY;  Service: Endoscopy;  Laterality: N/A; ?BMI   ? Body Mass Index: 25.51 kg/m?  ?  ?  Reproductive/Obstetrics ?negative OB ROS ? ?  ? ? ? ? ? ? ? ? ? ? ? ? ? ?  ?  ? ? ? ? ? ? ? ? ?Anesthesia Physical ?Anesthesia Plan ? ?ASA: 4 ? ?Anesthesia Plan: General  ? ?Post-op Pain Management:   ? ?Induction:  ? ?PONV Risk Score and Plan:  ? ?Airway Management Planned:  ? ?Additional Equipment:  ? ?Intra-op Plan:  ? ?Post-operative Plan:  ? ?Informed Consent: I have reviewed the patients History and Physical, chart, labs and discussed the procedure including the risks, benefits and alternatives for the proposed anesthesia with the patient or authorized representative who has indicated his/her understanding and acceptance.  ? ? ? ?Dental Advisory Given ? ?Plan Discussed with: CRNA ? ?Anesthesia Plan Comments:   ? ? ? ? ? ? ?Anesthesia Quick Evaluation ? ?

## 2021-08-09 NOTE — Anesthesia Postprocedure Evaluation (Signed)
Anesthesia Post Note ? ?Patient: Mary Nicholson ? ?Procedure(s) Performed: ESOPHAGOGASTRODUODENOSCOPY (EGD) WITH PROPOFOL ? ?Patient location during evaluation: PACU ?Anesthesia Type: General ?Level of consciousness: awake and alert ?Pain management: pain level controlled ?Vital Signs Assessment: post-procedure vital signs reviewed and stable ?Respiratory status: spontaneous breathing, nonlabored ventilation, respiratory function stable and patient connected to nasal cannula oxygen ?Cardiovascular status: blood pressure returned to baseline and stable ?Postop Assessment: no apparent nausea or vomiting ?Anesthetic complications: no ? ? ?No notable events documented. ? ? ?Last Vitals:  ?Vitals:  ? 08/08/21 0944 08/08/21 0954  ?BP: (!) 160/102 139/71  ?Pulse: 72 70  ?Resp: 15 13  ?Temp:    ?SpO2: 97% 97%  ?  ?Last Pain:  ?Vitals:  ? 08/08/21 0954  ?TempSrc:   ?PainSc: 0-No pain  ? ? ?  ?  ?  ?  ?  ?  ? ?Molli Barrows ? ? ? ? ?

## 2021-08-11 ENCOUNTER — Ambulatory Visit: Payer: Medicare Other | Admitting: Family Medicine

## 2021-08-15 ENCOUNTER — Ambulatory Visit: Payer: Medicare Other | Admitting: Family Medicine

## 2021-08-18 ENCOUNTER — Encounter: Payer: Self-pay | Admitting: Family Medicine

## 2021-08-18 ENCOUNTER — Ambulatory Visit (INDEPENDENT_AMBULATORY_CARE_PROVIDER_SITE_OTHER): Payer: Medicare Other | Admitting: Family Medicine

## 2021-08-18 VITALS — BP 180/92 | HR 80 | Ht 61.0 in | Wt 137.0 lb

## 2021-08-18 DIAGNOSIS — I10 Essential (primary) hypertension: Secondary | ICD-10-CM

## 2021-08-18 DIAGNOSIS — R55 Syncope and collapse: Secondary | ICD-10-CM | POA: Diagnosis not present

## 2021-08-18 DIAGNOSIS — E7801 Familial hypercholesterolemia: Secondary | ICD-10-CM

## 2021-08-18 DIAGNOSIS — B3731 Acute candidiasis of vulva and vagina: Secondary | ICD-10-CM

## 2021-08-18 DIAGNOSIS — L732 Hidradenitis suppurativa: Secondary | ICD-10-CM

## 2021-08-18 MED ORDER — DOXYCYCLINE HYCLATE 100 MG PO TABS: 100.0000 mg | ORAL_TABLET | Freq: Every day | ORAL | 5 refills | Status: DC

## 2021-08-18 MED ORDER — FLUCONAZOLE 150 MG PO TABS: 150.0000 mg | ORAL_TABLET | Freq: Once | ORAL | 0 refills | Status: AC

## 2021-08-18 MED ORDER — AMLODIPINE BESYLATE 5 MG PO TABS
5.0000 mg | ORAL_TABLET | Freq: Every day | ORAL | 1 refills | Status: DC
Start: 2021-08-18 — End: 2021-10-16

## 2021-08-18 NOTE — Progress Notes (Signed)
? ? ?Date:  08/18/2021  ? ?Name:  Mary Nicholson   DOB:  04-Aug-1955   MRN:  [id number] ? ? ?Chief Complaint: Vaginitis (Finished antibiotic- has yeast infection) ? ?Loss of Consciousness ?This is a new problem. The current episode started 1 to 4 weeks ago (10 days). The problem has been gradually improving. She lost consciousness for a period of less than 1 minute (1 to 2 minutes). Associated symptoms include weakness. Pertinent negatives include no abdominal pain, back pain, dizziness, fever, headaches or nausea.  ? ?Lab Results  ?Component Value Date  ? NA 143 05/22/2021  ? K 4.7 05/22/2021  ? CO2 23 05/22/2021  ? GLUCOSE 56 (L) 05/22/2021  ? BUN 12 05/22/2021  ? CREATININE 0.64 05/22/2021  ? CALCIUM 10.1 05/22/2021  ? EGFR 97 05/22/2021  ? GFRNONAA >60 10/02/2020  ? ?Lab Results  ?Component Value Date  ? CHOL 144 05/22/2021  ? HDL 45 05/22/2021  ? [place] 62 05/22/2021  ? TRIG 231 (H) 05/22/2021  ? ?Lab Results  ?Component Value Date  ? TSH 3.790 05/22/2021  ? ?Lab Results  ?Component Value Date  ? HGBA1C 7.3 (H) 05/31/2021  ? ?Lab Results  ?Component Value Date  ? WBC 11.9 (H) 10/02/2020  ? HGB 13.2 10/02/2020  ? HCT 40.2 10/02/2020  ? MCV 89.5 10/02/2020  ? PLT 330 10/02/2020  ? ?Lab Results  ?Component Value Date  ? ALT 21 07/22/2020  ? AST 16 07/22/2020  ? ALKPHOS 103 07/22/2020  ? BILITOT 0.3 07/22/2020  ? ?No results found for: 25OHVITD2, [place], VD25OH  ? ?Review of Systems  ?Constitutional: Negative.  Negative for chills, fatigue, fever and unexpected weight change.  ?HENT:  Negative for congestion, ear discharge, ear pain, rhinorrhea, sinus pressure, sneezing and sore throat.   ?Respiratory:  Negative for cough, shortness of breath, wheezing and stridor.   ?Cardiovascular:  Positive for syncope.  ?Gastrointestinal:  Negative for abdominal pain, blood in stool, constipation, diarrhea and nausea.  ?Genitourinary:  Negative for dysuria, flank pain, frequency, hematuria, urgency and vaginal  discharge.  ?Musculoskeletal:  Negative for arthralgias, back pain and myalgias.  ?Skin:  Negative for rash.  ?Neurological:  Positive for syncope, speech difficulty and weakness. Negative for dizziness and headaches.  ?Hematological:  Negative for adenopathy. Does not bruise/bleed easily.  ?Psychiatric/Behavioral:  Negative for dysphoric mood. The patient is not nervous/anxious.   ? ?Patient Active Problem List  ? Diagnosis Date Noted  ? Acute peptic ulcer of stomach   ? Stricture and stenosis of esophagus   ? 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 Chronic radicular lumbar pain 05/04/2021  ? History of lumbar fusion 05/04/2021  ? Chronic pain syndrome 05/04/2021  ? Bipolar affective disorder, mixed ([place]) 11/26/2019  ? COPD (chronic obstructive pulmonary disease) ([place]) 11/26/2019  ? PTSD (post-traumatic stress disorder) 11/26/2019  ? Schizophrenia ([place]) 11/26/2019  ? Abdominal wall cellulitis 11/12/2019  ? Abdominal wall abscess   ? Type 2 diabetes mellitus with diabetic neuropathy, without long-term current use of insulin ([place])   ? Hyperlipidemia   ? Bilateral hand numbness 09/16/2019  ? S/P hysterectomy 09/09/2019  ? Stage 3b chronic kidney disease ([place]) 09/09/2019  ? Cervical radiculitis 08/13/2019  ? Chronic bilateral low back pain with right-sided sciatica 08/13/2019  ? Lumbar radiculopathy 08/13/2019  ? Neck pain 08/13/2019  ? Numbness and tingling of both feet 07/30/2019  ? Dupuytren contracture 05/22/2019  ? Personal history of transient ischemic attack (TIA), and cerebral infarction without residual deficits  05/22/2019  ? Resting tremor 05/22/2019  ? Diverticulosis 05/20/2019  ? External hemorrhoid 05/20/2019  ? Abnormal cervical Papanicolaou smear 12/19/2018  ? Arthralgia of both hands 12/19/2018  ? Breast pain, right 12/19/2018  ? Diarrhea 12/19/2018  ? Dysphagia 12/19/2018  ? Paresthesias 12/19/2018  ? Seasonal allergies 12/19/2018  ? Gastroesophageal reflux disease 09/23/2015  ? Spinal stenosis of  lumbar region 05/04/2015  ? Allergic rhinitis 11/28/2014  ? Acquired hypothyroidism 09/13/2014  ? Essential hypertension 09/13/2014  ? Type 2 diabetes mellitus with diabetic neuropathy ([place]) 09/13/2014  ? ? ?No Known Allergies ? ?Past Surgical History:  ?Procedure Laterality Date  ? ABDOMINAL HYSTERECTOMY    ? BACK SURGERY    ? BREAST BIOPSY Right   ? negative 1988  ? CHOLECYSTECTOMY    ? ESOPHAGOGASTRODUODENOSCOPY (EGD) WITH PROPOFOL N/A 08/08/2021  ? Procedure: ESOPHAGOGASTRODUODENOSCOPY (EGD) WITH PROPOFOL;  Surgeon: Lucilla Lame, MD;  Location: [facility] ENDOSCOPY;  Service: Endoscopy;  Laterality: N/A;  ? ? ?Social History  ? ?Tobacco Use  ? Smoking status: Every Day  ?  Packs/day: 0.50  ?  Types: Cigarettes  ?  Last attempt to quit: 10/26/2019  ?  Years since quitting: 1.8  ? Smokeless tobacco: Never  ?Vaping Use  ? Vaping Use: Never used  ?Substance Use Topics  ? Alcohol use: Yes  ?  Comment: Occasional  ? Drug use: Never  ? ? ? ?Medication list has been reviewed and updated. ? ?Current Meds  ?Medication Sig  ? Acetaminophen (TYLENOL ARTHRITIS EXT RELIEF PO) Take 1 capsule by mouth 3 (three) times daily.  ? albuterol (VENTOLIN HFA) 108 (90 Base) MCG/ACT inhaler INHALE 1-2 PUFFS BY MOUTH EVERY 6 HOURS AS NEEDED FOR WHEEZE OR SHORTNESS OF BREATH  ? amLODipine (NORVASC) 2.5 MG tablet Take 1 tablet (2.5 mg total) by mouth daily.  ? aspirin 81 MG chewable tablet Chew by mouth.  ? atorvastatin (LIPITOR) 80 MG tablet Take 1 tablet (80 mg total) by mouth daily.  ? b complex vitamins capsule Take 1 capsule by mouth daily.  ? Biotin 5 MG CAPS Take 1 capsule by mouth daily.  ? busPIRone (BUSPAR) 15 MG tablet Take 15 mg by mouth 3 (three) times daily.  ? Cetirizine HCl (ALLERGY RELIEF) 10 MG CAPS Take 1 capsule by mouth daily.  ? famotidine (PEPCID) 40 MG tablet Take 1 tablet (40 mg total) by mouth daily.  ? fluticasone (FLONASE) 50 MCG/ACT nasal spray Place 2 sprays into both nostrils daily.  ? Fluticasone-Umeclidin-Vilant  (TRELEGY ELLIPTA) 100-62.5-25 MCG/INH AEPB Inhale 1 puff into the lungs daily.  ? glipiZIDE (GLUCOTROL XL) 10 MG 24 hr tablet Take 1 tablet (10 mg total) by mouth daily.  ? hydrochlorothiazide (HYDRODIURIL) 25 MG tablet Take 1 tablet (25 mg total) by mouth daily.  ? hydrOXYzine (ATARAX) 25 MG tablet Take 25 mg by mouth 2 (two) times daily.  ? lamoTRIgine (LAMICTAL) 200 MG tablet Take 200 mg by mouth 2 (two) times daily.  ? levothyroxine (SYNTHROID) 100 MCG tablet Take 1 tablet (100 mcg total) by mouth daily.  ? metFORMIN (GLUCOPHAGE-XR) 500 MG 24 hr tablet Take 2 tablets (1,000 mg total) by mouth 2 (two) times daily.  ? metoprolol succinate (TOPROL-XL) 50 MG 24 hr tablet Take 1 tablet (50 mg total) by mouth daily.  ? Multiple Vitamins-Minerals (CENTRUM SILVER PO) Take 1 each by mouth daily.  ? mupirocin ointment (BACTROBAN) 2 % Apply 1 application. topically 2 (two) times daily.  ? ONETOUCH ULTRA test strip 3 (three)  times daily.  ? SM IRON 325 (65 Fe) MG tablet Take by mouth.  ? traZODone (DESYREL) 50 MG tablet Take 50 mg by mouth at bedtime as needed.  ? venlafaxine XR (EFFEXOR-XR) 75 MG 24 hr capsule Take 150 mg by mouth in the morning and at bedtime.   ? ? ? ?  08/18/2021  ?  1:15 PM 05/22/2021  ?  4:29 PM 10/07/2020  ?  2:26 PM 07/14/2020  ?  2:26 PM  ?GAD 7 : Generalized Anxiety Score  ?Nervous, Anxious, on Edge 0 0 1 2  ?Control/stop worrying 0 0 0 3  ?Worry too much - different things 0 0 0 3  ?Trouble relaxing 0 0 0 2  ?Restless 0 0 0 1  ?Easily annoyed or irritable 0 0 0 2  ?Afraid - awful might happen 0 0 0 0  ?Total GAD 7 Score 0 0 1 13  ?Anxiety Difficulty Not difficult at all Not difficult at all Not difficult at all Very difficult  ? ? ? ?  08/18/2021  ?  1:15 PM  ?Depression screen PHQ 2/9  ?Decreased Interest 0  ?Down, Depressed, Hopeless 0  ?PHQ - 2 Score 0  ?Altered sleeping 0  ?Tired, decreased energy 0  ?Change in appetite 0  ?Feeling bad or failure about yourself  0  ?Trouble concentrating 0   ?Moving slowly or fidgety/restless 0  ?Suicidal thoughts 0  ?PHQ-9 Score 0  ?Difficult doing work/chores Not difficult at all  ? ? ?BP Readings from Last 3 Encounters:  ?08/18/21 (!) 160/100  ?08/08/21 139/71  ?04/14/2

## 2021-08-22 ENCOUNTER — Ambulatory Visit (INDEPENDENT_AMBULATORY_CARE_PROVIDER_SITE_OTHER): Payer: Medicare Other | Admitting: Family Medicine

## 2021-08-22 ENCOUNTER — Encounter: Payer: Self-pay | Admitting: Family Medicine

## 2021-08-22 VITALS — BP 142/80 | HR 66 | Ht 61.0 in | Wt 137.0 lb

## 2021-08-22 DIAGNOSIS — I10 Essential (primary) hypertension: Secondary | ICD-10-CM

## 2021-08-22 NOTE — Progress Notes (Signed)
? ? ?Date:  08/22/2021  ? ?Name:  Mary Nicholson   DOB:  1955-05-28   MRN:  [id number] ? ? ?Chief Complaint: Hypertension ? ?Hypertension ?This is a chronic problem. The current episode started more than 1 year ago. The problem has been gradually improving since onset. The problem is controlled. Associated symptoms include headaches and shortness of breath. Pertinent negatives include no anxiety, blurred vision, chest pain, neck pain, palpitations or PND. Risk factors for coronary artery disease include dyslipidemia and diabetes mellitus. Past treatments include diuretics and calcium channel blockers. The current treatment provides moderate improvement. There are no compliance problems.  Hypertensive end-organ damage includes CVA. There is no history of angina, kidney disease, CAD/MI, left ventricular hypertrophy or PVD. There is no history of chronic renal disease, a hypertension causing med or renovascular disease.  ? ?Lab Results  ?Component Value Date  ? NA 143 05/22/2021  ? K 4.7 05/22/2021  ? CO2 23 05/22/2021  ? GLUCOSE 56 (L) 05/22/2021  ? BUN 12 05/22/2021  ? CREATININE 0.64 05/22/2021  ? CALCIUM 10.1 05/22/2021  ? EGFR 97 05/22/2021  ? GFRNONAA >60 10/02/2020  ? ?Lab Results  ?Component Value Date  ? CHOL 144 05/22/2021  ? HDL 45 05/22/2021  ? [place] 62 05/22/2021  ? TRIG 231 (H) 05/22/2021  ? ?Lab Results  ?Component Value Date  ? TSH 3.790 05/22/2021  ? ?Lab Results  ?Component Value Date  ? HGBA1C 7.3 (H) 05/31/2021  ? ?Lab Results  ?Component Value Date  ? WBC 11.9 (H) 10/02/2020  ? HGB 13.2 10/02/2020  ? HCT 40.2 10/02/2020  ? MCV 89.5 10/02/2020  ? PLT 330 10/02/2020  ? ?Lab Results  ?Component Value Date  ? ALT 21 07/22/2020  ? AST 16 07/22/2020  ? ALKPHOS 103 07/22/2020  ? BILITOT 0.3 07/22/2020  ? ?No results found for: 25OHVITD2, [place], VD25OH  ? ?Review of Systems  ?Constitutional:  Negative for chills and fever.  ?HENT:  Negative for drooling, ear discharge, ear pain and sore throat.    ?Eyes:  Negative for blurred vision.  ?Respiratory:  Positive for shortness of breath. Negative for cough and wheezing.   ?Cardiovascular:  Negative for chest pain, palpitations, leg swelling and PND.  ?Gastrointestinal:  Negative for abdominal pain, blood in stool, constipation, diarrhea and nausea.  ?Endocrine: Negative for polydipsia and polyuria.  ?Musculoskeletal:  Negative for back pain, myalgias and neck pain.  ?Skin:  Negative for rash.  ?Allergic/Immunologic: Negative for environmental allergies.  ?Neurological:  Positive for headaches. Negative for dizziness, facial asymmetry, speech difficulty and weakness.  ?Psychiatric/Behavioral:  Negative for suicidal ideas. The patient is not nervous/anxious.   ? ?Patient Active Problem List  ? Diagnosis Date Noted  ? Acute peptic ulcer of stomach   ? Stricture and stenosis of esophagus   ? 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 Chronic radicular lumbar pain 05/04/2021  ? History of lumbar fusion 05/04/2021  ? Chronic pain syndrome 05/04/2021  ? Bipolar affective disorder, mixed ([place]) 11/26/2019  ? COPD (chronic obstructive pulmonary disease) ([place]) 11/26/2019  ? PTSD (post-traumatic stress disorder) 11/26/2019  ? Schizophrenia ([place]) 11/26/2019  ? Abdominal wall cellulitis 11/12/2019  ? Abdominal wall abscess   ? Type 2 diabetes mellitus with diabetic neuropathy, without long-term current use of insulin ([place])   ? Hyperlipidemia   ? Bilateral hand numbness 09/16/2019  ? S/P hysterectomy 09/09/2019  ? Stage 3b chronic kidney disease ([place]) 09/09/2019  ? Cervical radiculitis 08/13/2019  ? Chronic  bilateral low back pain with right-sided sciatica 08/13/2019  ? Lumbar radiculopathy 08/13/2019  ? Neck pain 08/13/2019  ? Numbness and tingling of both feet 07/30/2019  ? Dupuytren contracture 05/22/2019  ? Personal history of transient ischemic attack (TIA), and cerebral infarction without residual deficits 05/22/2019  ? Resting tremor 05/22/2019  ? Diverticulosis  05/20/2019  ? External hemorrhoid 05/20/2019  ? Abnormal cervical Papanicolaou smear 12/19/2018  ? Arthralgia of both hands 12/19/2018  ? Breast pain, right 12/19/2018  ? Diarrhea 12/19/2018  ? Dysphagia 12/19/2018  ? Paresthesias 12/19/2018  ? Seasonal allergies 12/19/2018  ? Gastroesophageal reflux disease 09/23/2015  ? Spinal stenosis of lumbar region 05/04/2015  ? Allergic rhinitis 11/28/2014  ? Acquired hypothyroidism 09/13/2014  ? Essential hypertension 09/13/2014  ? Type 2 diabetes mellitus with diabetic neuropathy ([place]) 09/13/2014  ? ? ?No Known Allergies ? ?Past Surgical History:  ?Procedure Laterality Date  ? ABDOMINAL HYSTERECTOMY    ? BACK SURGERY    ? BREAST BIOPSY Right   ? negative 1988  ? CHOLECYSTECTOMY    ? ESOPHAGOGASTRODUODENOSCOPY (EGD) WITH PROPOFOL N/A 08/08/2021  ? Procedure: ESOPHAGOGASTRODUODENOSCOPY (EGD) WITH PROPOFOL;  Surgeon: Lucilla Lame, MD;  Location: [facility] ENDOSCOPY;  Service: Endoscopy;  Laterality: N/A;  ? ? ?Social History  ? ?Tobacco Use  ? Smoking status: Every Day  ?  Packs/day: 0.50  ?  Types: Cigarettes  ?  Last attempt to quit: 10/26/2019  ?  Years since quitting: 1.8  ? Smokeless tobacco: Never  ?Vaping Use  ? Vaping Use: Never used  ?Substance Use Topics  ? Alcohol use: Yes  ?  Comment: Occasional  ? Drug use: Never  ? ? ? ?Medication list has been reviewed and updated. ? ?Current Meds  ?Medication Sig  ? Acetaminophen (TYLENOL ARTHRITIS EXT RELIEF PO) Take 1 capsule by mouth 3 (three) times daily.  ? albuterol (VENTOLIN HFA) 108 (90 Base) MCG/ACT inhaler INHALE 1-2 PUFFS BY MOUTH EVERY 6 HOURS AS NEEDED FOR WHEEZE OR SHORTNESS OF BREATH  ? amLODipine (NORVASC) 5 MG tablet Take 1 tablet (5 mg total) by mouth daily.  ? aspirin 81 MG chewable tablet Chew by mouth.  ? atorvastatin (LIPITOR) 80 MG tablet Take 1 tablet (80 mg total) by mouth daily.  ? b complex vitamins capsule Take 1 capsule by mouth daily.  ? Biotin 5 MG CAPS Take 1 capsule by mouth daily.  ? busPIRone (BUSPAR)  15 MG tablet Take 15 mg by mouth 3 (three) times daily.  ? Cetirizine HCl (ALLERGY RELIEF) 10 MG CAPS Take 1 capsule by mouth daily.  ? doxycycline (VIBRA-TABS) 100 MG tablet Take 1 tablet (100 mg total) by mouth daily.  ? famotidine (PEPCID) 40 MG tablet Take 1 tablet (40 mg total) by mouth daily.  ? fluticasone (FLONASE) 50 MCG/ACT nasal spray Place 2 sprays into both nostrils daily.  ? Fluticasone-Umeclidin-Vilant (TRELEGY ELLIPTA) 100-62.5-25 MCG/INH AEPB Inhale 1 puff into the lungs daily.  ? glipiZIDE (GLUCOTROL XL) 10 MG 24 hr tablet Take 1 tablet (10 mg total) by mouth daily.  ? hydrochlorothiazide (HYDRODIURIL) 25 MG tablet Take 1 tablet (25 mg total) by mouth daily.  ? hydrOXYzine (ATARAX) 25 MG tablet Take 25 mg by mouth 2 (two) times daily.  ? lamoTRIgine (LAMICTAL) 200 MG tablet Take 200 mg by mouth daily. cbc  ? levothyroxine (SYNTHROID) 100 MCG tablet Take 1 tablet (100 mcg total) by mouth daily.  ? metFORMIN (GLUCOPHAGE-XR) 500 MG 24 hr tablet Take 2 tablets (1,000 mg total)  by mouth 2 (two) times daily.  ? metoprolol succinate (TOPROL-XL) 50 MG 24 hr tablet Take 1 tablet (50 mg total) by mouth daily.  ? Multiple Vitamins-Minerals (CENTRUM SILVER PO) Take 1 each by mouth daily.  ? mupirocin ointment (BACTROBAN) 2 % Apply 1 application. topically 2 (two) times daily.  ? ONETOUCH ULTRA test strip 3 (three) times daily.  ? traZODone (DESYREL) 50 MG tablet Take 50 mg by mouth at bedtime as needed.  ? venlafaxine XR (EFFEXOR-XR) 75 MG 24 hr capsule Take 150 mg by mouth in the morning and at bedtime.   ? [DISCONTINUED] SM IRON 325 (65 Fe) MG tablet Take by mouth.  ? ? ? ?  08/22/2021  ?  1:40 PM 08/18/2021  ?  1:15 PM 05/22/2021  ?  4:29 PM 10/07/2020  ?  2:26 PM  ?GAD 7 : Generalized Anxiety Score  ?Nervous, Anxious, on Edge 2 0 0 1  ?Control/stop worrying 2 0 0 0  ?Worry too much - different things 1 0 0 0  ?Trouble relaxing 0 0 0 0  ?Restless 0 0 0 0  ?Easily annoyed or irritable 0 0 0 0  ?Afraid - awful  might happen 0 0 0 0  ?Total GAD 7 Score 5 0 0 1  ?Anxiety Difficulty Not difficult at all Not difficult at all Not difficult at all Not difficult at all  ? ? ? ?  08/22/2021  ?  1:39 PM  ?Depression screen PHQ

## 2021-08-23 ENCOUNTER — Encounter: Payer: Self-pay | Admitting: Student in an Organized Health Care Education/Training Program

## 2021-08-23 ENCOUNTER — Ambulatory Visit
Payer: Medicare Other | Attending: Student in an Organized Health Care Education/Training Program | Admitting: Student in an Organized Health Care Education/Training Program

## 2021-08-23 VITALS — BP 135/87 | HR 65 | Temp 97.3°F | Resp 14 | Ht 61.0 in | Wt 137.0 lb

## 2021-08-23 DIAGNOSIS — Z981 Arthrodesis status: Secondary | ICD-10-CM | POA: Insufficient documentation

## 2021-08-23 DIAGNOSIS — M961 Postlaminectomy syndrome, not elsewhere classified: Secondary | ICD-10-CM | POA: Insufficient documentation

## 2021-08-23 DIAGNOSIS — M5416 Radiculopathy, lumbar region: Secondary | ICD-10-CM | POA: Insufficient documentation

## 2021-08-23 DIAGNOSIS — G894 Chronic pain syndrome: Secondary | ICD-10-CM | POA: Insufficient documentation

## 2021-08-23 DIAGNOSIS — G8929 Other chronic pain: Secondary | ICD-10-CM | POA: Insufficient documentation

## 2021-08-23 NOTE — Progress Notes (Signed)
PROVIDER NOTE: Information contained herein reflects review and annotations entered in association with encounter. Interpretation of such information and data should be left to medically-trained personnel. Information provided to patient can be located elsewhere in the medical record under "Patient Instructions". Document created using STT-dictation technology, any transcriptional errors that may result from process are unintentional.  ?  ?Patient: Mary Nicholson  Service Category: E/M  Provider: Gillis Santa, MD  ?DOB: [birth year]  DOS: 08/23/2021  Specialty: Interventional Pain Management  ?MRN: [id number]  Setting: Ambulatory outpatient  PCP: Juline Patch, MD  ?Type: Established Patient    Referring Provider: Juline Patch, MD  ?Location: Office  Delivery: Face-to-face    ? ?HPI  ?[name], a 66 y.o. year old female, is here today because of her Failed back surgical syndrome [M96.1]. [name] primary complain today is Back Pain (lower) ?Last encounter: My last encounter with her was on 05/04/21 ? ?Pertinent problems: [name] has Type 2 diabetes mellitus with diabetic neuropathy ([place]); Failed back surgical syndrome; Chronic radicular lumbar pain; History of lumbar fusion; and Chronic pain syndrome on their pertinent problem list. ?Pain Assessment: Severity of Chronic pain is reported as a 7 /10. Location: Back Lower/left leg to the foot. Onset: More than a month ago. Quality: Sharp. Timing: Intermittent. Modifying factor(s): Tylenol. ?Vitals:  height is '5\' 1"'$  (1.549 m) and weight is 137 lb (62.1 kg). Her temporal temperature is 97.3 ?F (36.3 ?C) (abnormal). Her blood pressure is 135/87 and her pulse is 65. Her respiration is 14 and oxygen saturation is 92%.  ? ?Reason for encounter: follow-up evaluation.   ? ?Patient's initial clinic visit with me was on 05/04/2021 for a spinal cord stimulator trial evaluation.  We pursued a SCS work-up which includes thoracic MRI, psych  eval both of which were completed.  She has clearance from psychology to proceed with spinal cord stimulator trial and her thoracic MRI does not reveal any significant canal stenosis to preclude a safe percutaneous trial.  Of note, patient did have a TIA last week for which she is seeing a neurologist tomorrow.  Of note, she has had a prior TIA in the past in 2014.  She is currently on aspirin 81 mg.  From my standpoint I think it would still be okay to proceed with a spinal cord stimulator trial but I would also like neurology to weigh in.  If there is thought of adding an antiplatelet, that could certainly impact her risk.  I reviewed spinal cord stimulation in great detail with the patient as well as her daughter who accompanied her today.  I will place an order for [employer] spinal cord stimulator trial.  I will also given the patient a antimicrobial cleansing solution that she can cleanse her lower back with the night before and the morning of her procedure. ? ?Please see HPI from initial clinic visit: ?Pain Assessment: ?Location: Left Leg ?Radiating: radiates from left hip on the side to foot ?Onset: More than a month ago ?Duration: Chronic pain ?Quality:  (excruciating) ?Severity: 6 /10 (subjective, self-reported pain score)  ?Effect on ADL: Limits activities ?Timing: Intermittent ?Modifying factors: ibuprofen, gabapentin ?BP: (!) 172/99  HR: 91 ?  ?Onset and Duration: Gradual ?Cause of pain: Unknown ?Severity: Getting worse, NAS-11 at its worse: 10/10, NAS-11 at its best: 4/10, NAS-11 now: 6/10, and NAS-11 on the average: 7/10 ?Timing: Not influenced by the time of the day ?Aggravating Factors: Bending, Kneeling, Prolonged sitting, Prolonged standing,  Squatting, Stooping , Twisting, and Walking ?Alleviating Factors: Lying down, Medications, Sleeping, and Walking ?Associated Problems: Day-time cramps, Night-time cramps, Depression, Inability to control bladder (urine), Inability to control bowel, Numbness,  Sweating, Swelling, Tingling, Weakness, and Pain that does not allow patient to sleep ?Quality of Pain: Disabling, Sharp, and Throbbing ?Previous Examinations or Tests: Bone scan, CT scan, Ct-Myelogram, MRI scan, Nerve block, X-rays, Neurological evaluation, Neurosurgical evaluation, Orthopedic evaluation, Chiropractic evaluation, and Psychiatric evaluation ?Previous Treatments: Chiropractic manipulations, Epidural steroid injections, Facet blocks, Narcotic medications, Physical Therapy, Pool exercises, Radiofrequency, Relaxation therapy, Steroid treatments by mouth, Strengthening exercises, Stretching exercises, and Trigger point injections ?  ?Mary Nicholson is a pleasant 66 year old female who presents with a chief complaint of low back pain with radiation into her left leg in a dermatomal fashion.  She has a history of lumbar spinal fusion that was done in 1999.  She did well for approximately 20 years but then started to experience weakness and falls in 2019.  She was managed with physiatry in the past.  She has had bilateral L4-L5 transforaminal ESI on 08/31/2019 with limited response followed by a left S1 transforaminal ESI on 07/29/2020 with no benefit.  She went on to have L3-L4 medial branch RFA on 11/16/2020 with limited response.  She is being referred here for consideration of spinal cord stimulator trial.  She has done physical therapy in the past year and tries to do home physical therapy exercises that she has learned.  She is on gabapentin 300 mg 3 times a day.  She had a mild stroke in 2014 and is currently on aspirin 81 mg.  She also has psychiatric conditions as indicated above.  She does see [facility] in [place]. ? ? ?ROS  ?Constitutional: Denies any fever or chills ?Gastrointestinal: No reported hemesis, hematochezia, vomiting, or acute GI distress ?Musculoskeletal:  Low back pain with radiation into left leg. ?Neurological: No reported episodes of acute onset apraxia, aphasia,  dysarthria, agnosia, amnesia, paralysis, loss of coordination, or loss of consciousness ? ?Medication Review  ?Acetaminophen, Biotin, Cetirizine HCl, Fluticasone-Umeclidin-Vilant, Multiple Vitamins-Minerals, albuterol, amLODipine, aspirin, atorvastatin, b complex vitamins, busPIRone, doxycycline, famotidine, fluticasone, glipiZIDE, glucose blood, hydrOXYzine, hydrochlorothiazide, lamoTRIgine, levothyroxine, metFORMIN, metoprolol succinate, mupirocin ointment, traZODone, and venlafaxine XR ? ?History Review  ?Allergy: Ms. Atziri Zubiate has No Known Allergies. ?Drug: Ms. Daneli Butkiewicz  reports no history of drug use. ?Alcohol:  reports current alcohol use. ?Tobacco:  reports that she has been smoking cigarettes. She has been smoking an average of .5 packs per day. She has never used smokeless tobacco. ?Social: Ms. Tashauna Caisse  reports that she has been smoking cigarettes. She has been smoking an average of .5 packs per day. She has never used smokeless tobacco. She reports current alcohol use. She reports that she does not use drugs. ?Medical:  has a past medical history of Bipolar 1 disorder ([place]), Cancer ([place]), COPD (chronic obstructive pulmonary disease) ([place]), Diabetes mellitus without complication ([place]), Dyspnea, GERD (gastroesophageal reflux disease), right breast biopsy (1988), Hyperlipidemia, Hypertension, PTSD (post-traumatic stress disorder), Schizophrenia ([place]), Spinal stenosis, Stroke ([place]), and Thyroid disease. ?Surgical: Ms. Sherley Mckenney  has a past surgical history that includes Breast biopsy (Right); Abdominal hysterectomy; Cholecystectomy; Back surgery; and Esophagogastroduodenoscopy (egd) with propofol (N/A, 08/08/2021). ?Family: family history includes Alzheimer's disease in her father; COPD in her father; Diabetes in her mother; Heart failure in her mother; Ovarian cancer (age of onset: 3) in her mother. ? ?Laboratory Chemistry Profile  ? ?Renal ?Lab Results  ?Component Value  Date  ? BUN 12  05/22/2021  ? CREATININE 0.64 05/22/2021  ? BCR 19 05/22/2021  ? GFRAA >60 11/14/2019  ? GFRNONAA >60 10/02/2020  ?  Hepatic ?Lab Results  ?Component Value Date  ? AST 16 07/22/2020  ? ALT 21 07/22/2020  ? ALBUMIN 4.8 01

## 2021-08-24 DIAGNOSIS — R404 Transient alteration of awareness: Secondary | ICD-10-CM | POA: Diagnosis not present

## 2021-08-24 DIAGNOSIS — R0683 Snoring: Secondary | ICD-10-CM | POA: Diagnosis not present

## 2021-08-24 DIAGNOSIS — E538 Deficiency of other specified B group vitamins: Secondary | ICD-10-CM | POA: Diagnosis not present

## 2021-08-24 DIAGNOSIS — I6930 Unspecified sequelae of cerebral infarction: Secondary | ICD-10-CM | POA: Diagnosis not present

## 2021-08-24 DIAGNOSIS — E569 Vitamin deficiency, unspecified: Secondary | ICD-10-CM | POA: Diagnosis not present

## 2021-08-29 ENCOUNTER — Other Ambulatory Visit: Payer: Self-pay

## 2021-08-29 DIAGNOSIS — Z122 Encounter for screening for malignant neoplasm of respiratory organs: Secondary | ICD-10-CM

## 2021-08-29 DIAGNOSIS — Z87891 Personal history of nicotine dependence: Secondary | ICD-10-CM

## 2021-08-29 DIAGNOSIS — F17210 Nicotine dependence, cigarettes, uncomplicated: Secondary | ICD-10-CM

## 2021-08-31 ENCOUNTER — Other Ambulatory Visit: Payer: Self-pay | Admitting: Family Medicine

## 2021-08-31 DIAGNOSIS — I10 Essential (primary) hypertension: Secondary | ICD-10-CM

## 2021-08-31 DIAGNOSIS — E7801 Familial hypercholesterolemia: Secondary | ICD-10-CM

## 2021-09-01 ENCOUNTER — Other Ambulatory Visit: Payer: Self-pay

## 2021-09-01 MED ORDER — TRELEGY ELLIPTA 100-62.5-25 MCG/ACT IN AEPB: 1.0000 | INHALATION_SPRAY | Freq: Every day | RESPIRATORY_TRACT | 0 refills | Status: DC

## 2021-09-01 NOTE — Telephone Encounter (Signed)
See previous note

## 2021-09-01 NOTE — Telephone Encounter (Signed)
Rx : Amlodipine- 08/18/21 #60 1RF ?       Atorvastatin- 05/22/21 #90 1RF  }too soon ?Requested Prescriptions  ?Pending Prescriptions Disp Refills  ?? amLODipine (NORVASC) 5 MG tablet [Pharmacy Med Name: AMLODIPINE BESYLATE 5 MG TAB] 90 tablet 1  ?  Sig: TAKE 1 TABLET (5 MG TOTAL) BY MOUTH DAILY.  ?  ? Cardiovascular: Calcium Channel Blockers 2 Passed - 08/31/2021  4:38 PM  ?  ?  Passed - Last BP in normal range  ?  BP Readings from Last 1 Encounters:  ?08/23/21 135/87  ?   ?  ?  Passed - Last Heart Rate in normal range  ?  Pulse Readings from Last 1 Encounters:  ?08/23/21 65  ?   ?  ?  Passed - Valid encounter within last 6 months  ?  Recent Outpatient Visits   ?      ? 1 week ago Essential hypertension  ? [facility] Juline Patch, MD  ? 2 weeks ago Syncope, unspecified syncope type  ? [facility] Juline Patch, MD  ? 4 weeks ago Folliculitis  ? [facility] Juline [facility], MD  ? 3 months ago Familial hypercholesterolemia  ? [facility] Juline Patch, MD  ? 9 months ago Type 2 diabetes mellitus with diabetic neuropathy, without long-term current use of insulin ([place])  ? [facility] Juline Patch, MD  ?  ?  ?Future Appointments   ?        ? In 2 months Juline Patch, MD [facility]  ?  ? ?  ?  ?  ?? atorvastatin (LIPITOR) 80 MG tablet [Pharmacy Med Name: ATORVASTATIN 80 MG TABLET] 90 tablet 1  ?  Sig: TAKE 1 TABLET BY MOUTH EVERY DAY  ?  ? Cardiovascular:  Antilipid - Statins Failed - 08/31/2021  4:38 PM  ?  ?  Failed - Lipid Panel in normal range within the last 12 months  ?  Cholesterol, Total  ?Date Value Ref Range Status  ?05/22/2021 144 100 - 199 mg/dL Final  ? ?LDL Chol Calc (NIH)  ?Date Value Ref Range Status  ?05/22/2021 62 0 - 99 mg/dL Final  ? ?HDL  ?Date Value Ref Range Status  ?05/22/2021 45 >39 mg/dL Final  ? ?Triglycerides  ?Date Value Ref Range Status  ?05/22/2021 231 (H) 0 - 149 mg/dL Final  ? ?  ?  ?  Passed - Patient is not  pregnant  ?  ?  Passed - Valid encounter within last 12 months  ?  Recent Outpatient Visits   ?      ? 1 week ago Essential hypertension  ? [facility] Juline Patch, MD  ? 2 weeks ago Syncope, unspecified syncope type  ? [facility] Juline Patch, MD  ? 4 weeks ago Folliculitis  ? [facility] Juline Patch, MD  ? 3 months ago Familial hypercholesterolemia  ? [facility] Juline Patch, MD  ? 9 months ago Type 2 diabetes mellitus with diabetic neuropathy, without long-term current use of insulin ([place])  ? [facility] Juline Patch, MD  ?  ?  ?Future Appointments   ?        ? In 2 months Juline Patch, MD [facility] [place]  ?  ? ?  ?  ?  ?? TRELEGY ELLIPTA 100-62.5-25 MCG/ACT AEPB [Pharmacy Med Name: Viviana Simpler  ELLIPTA 100-62.5-25] 60 each 3  ?  Sig: TAKE 1 PUFF BY MOUTH EVERY DAY  ?  ? There is no refill protocol information for this order  ?  ? ?

## 2021-09-01 NOTE — Telephone Encounter (Signed)
Requested medication (s) are due for refill today - expired Rx ? ?Requested medication (s) are on the active medication list -yes ? ?Future visit scheduled -yes ? ?Last refill: 07/22/20 60 3RF ? ?Notes to clinic: expired Rx-sent for review of request ? ?Requested Prescriptions  ?Pending Prescriptions Disp Refills  ? TRELEGY ELLIPTA 100-62.5-25 MCG/ACT AEPB [Pharmacy Med Name: TRELEGY ELLIPTA 100-62.5-25] 60 each 3  ?  Sig: TAKE 1 PUFF BY MOUTH EVERY DAY  ?  ? There is no refill protocol information for this order  ?  ?Refused Prescriptions Disp Refills  ? amLODipine (NORVASC) 5 MG tablet [Pharmacy Med Name: AMLODIPINE BESYLATE 5 MG TAB] 90 tablet 1  ?  Sig: TAKE 1 TABLET (5 MG TOTAL) BY MOUTH DAILY.  ?  ? Cardiovascular: Calcium Channel Blockers 2 Passed - 08/31/2021  4:38 PM  ?  ?  Passed - Last BP in normal range  ?  BP Readings from Last 1 Encounters:  ?08/23/21 135/87  ?   ?  ?  Passed - Last Heart Rate in normal range  ?  Pulse Readings from Last 1 Encounters:  ?08/23/21 65  ?   ?  ?  Passed - Valid encounter within last 6 months  ?  Recent Outpatient Visits   ? ?      ? 1 week ago Essential hypertension  ? [facility] Juline Patch, MD  ? 2 weeks ago Syncope, unspecified syncope type  ? [facility] Juline Patch, MD  ? 4 weeks ago Folliculitis  ? [facility] Juline Patch, MD  ? 3 months ago Familial hypercholesterolemia  ? [facility] Juline Patch, MD  ? 9 months ago Type 2 diabetes mellitus with diabetic neuropathy, without long-term current use of insulin ([place])  ? [facility] Juline Patch, MD  ? ?  ?  ?Future Appointments   ? ?        ? In 2 months Juline Patch, MD [facility]  ? ?  ? ? ?  ?  ?  ? atorvastatin (LIPITOR) 80 MG tablet [Pharmacy Med Name: ATORVASTATIN 80 MG TABLET] 90 tablet 1  ?  Sig: TAKE 1 TABLET BY MOUTH EVERY DAY  ?  ? Cardiovascular:  Antilipid - Statins Failed - 08/31/2021  4:38 PM  ?  ?  Failed - Lipid Panel in  normal range within the last 12 months  ?  Cholesterol, Total  ?Date Value Ref Range Status  ?05/22/2021 144 100 - 199 mg/dL Final  ? ?LDL Chol Calc (NIH)  ?Date Value Ref Range Status  ?05/22/2021 62 0 - 99 mg/dL Final  ? ?HDL  ?Date Value Ref Range Status  ?05/22/2021 45 >39 mg/dL Final  ? ?Triglycerides  ?Date Value Ref Range Status  ?05/22/2021 231 (H) 0 - 149 mg/dL Final  ? ?  ?  ?  Passed - Patient is not pregnant  ?  ?  Passed - Valid encounter within last 12 months  ?  Recent Outpatient Visits   ? ?      ? 1 week ago Essential hypertension  ? [facility] Juline Patch, MD  ? 2 weeks ago Syncope, unspecified syncope type  ? [facility] Juline Patch, MD  ? 4 weeks ago Folliculitis  ? [facility] Juline Patch, MD  ? 3 months ago Familial hypercholesterolemia  ? [facility] Juline Patch, MD  ?  9 months ago Type 2 diabetes mellitus with diabetic neuropathy, without long-term current use of insulin ([place])  ? [facility] Juline Patch, MD  ? ?  ?  ?Future Appointments   ? ?        ? In 2 months Juline Patch, MD [facility] [place]  ? ?  ? ? ?  ?  ?  ? ? ? ?Requested Prescriptions  ?Pending Prescriptions Disp Refills  ? TRELEGY ELLIPTA 100-62.5-25 MCG/ACT AEPB [Pharmacy Med Name: TRELEGY ELLIPTA 100-62.5-25] 60 each 3  ?  Sig: TAKE 1 PUFF BY MOUTH EVERY DAY  ?  ? There is no refill protocol information for this order  ?  ?Refused Prescriptions Disp Refills  ? amLODipine (NORVASC) 5 MG tablet [Pharmacy Med Name: AMLODIPINE BESYLATE 5 MG TAB] 90 tablet 1  ?  Sig: TAKE 1 TABLET (5 MG TOTAL) BY MOUTH DAILY.  ?  ? Cardiovascular: Calcium Channel Blockers 2 Passed - 08/31/2021  4:38 PM  ?  ?  Passed - Last BP in normal range  ?  BP Readings from Last 1 Encounters:  ?08/23/21 135/87  ?   ?  ?  Passed - Last Heart Rate in normal range  ?  Pulse Readings from Last 1 Encounters:  ?08/23/21 65  ?   ?  ?  Passed - Valid encounter within last 6 months  ?   Recent Outpatient Visits   ? ?      ? 1 week ago Essential hypertension  ? [facility] Juline Patch, MD  ? 2 weeks ago Syncope, unspecified syncope type  ? [facility] Juline Patch, MD  ? 4 weeks ago Folliculitis  ? [facility] Juline Patch, MD  ? 3 months ago Familial hypercholesterolemia  ? [facility] Juline Patch, MD  ? 9 months ago Type 2 diabetes mellitus with diabetic neuropathy, without long-term current use of insulin ([place])  ? [facility] Juline Patch, MD  ? ?  ?  ?Future Appointments   ? ?        ? In 2 months Juline Patch, MD [facility]  ? ?  ? ? ?  ?  ?  ? atorvastatin (LIPITOR) 80 MG tablet [Pharmacy Med Name: ATORVASTATIN 80 MG TABLET] 90 tablet 1  ?  Sig: TAKE 1 TABLET BY MOUTH EVERY DAY  ?  ? Cardiovascular:  Antilipid - Statins Failed - 08/31/2021  4:38 PM  ?  ?  Failed - Lipid Panel in normal range within the last 12 months  ?  Cholesterol, Total  ?Date Value Ref Range Status  ?05/22/2021 144 100 - 199 mg/dL Final  ? ?LDL Chol Calc (NIH)  ?Date Value Ref Range Status  ?05/22/2021 62 0 - 99 mg/dL Final  ? ?HDL  ?Date Value Ref Range Status  ?05/22/2021 45 >39 mg/dL Final  ? ?Triglycerides  ?Date Value Ref Range Status  ?05/22/2021 231 (H) 0 - 149 mg/dL Final  ? ?  ?  ?  Passed - Patient is not pregnant  ?  ?  Passed - Valid encounter within last 12 months  ?  Recent Outpatient Visits   ? ?      ? 1 week ago Essential hypertension  ? [facility] Juline Patch, MD  ? 2 weeks ago Syncope, unspecified syncope type  ? [facility] Juline Patch, MD  ? 4  weeks ago Folliculitis  ? [facility] Juline Patch, MD  ? 3 months ago Familial hypercholesterolemia  ? [facility] Juline Patch, MD  ? 9 months ago Type 2 diabetes mellitus with diabetic neuropathy, without long-term current use of insulin ([place])  ? [facility] Juline Patch, MD  ? ?  ?  ?Future Appointments   ? ?         ? In 2 months Juline Patch, MD [facility] [place]  ? ?  ? ? ?  ?  ?  ? ? ? ?

## 2021-09-11 ENCOUNTER — Encounter: Payer: Self-pay | Admitting: Acute Care

## 2021-09-11 ENCOUNTER — Ambulatory Visit (INDEPENDENT_AMBULATORY_CARE_PROVIDER_SITE_OTHER): Payer: Medicare Other | Admitting: Acute Care

## 2021-09-11 DIAGNOSIS — F17210 Nicotine dependence, cigarettes, uncomplicated: Secondary | ICD-10-CM | POA: Diagnosis not present

## 2021-09-11 NOTE — Patient Instructions (Signed)
Thank you for participating in the [facility]. It was our pleasure to meet you today. We will call you with the results of your scan within the next few days. Your scan will be assigned a Lung RADS category score by the physicians reading the scans.  This Lung RADS score determines follow up scanning.  See below for description of categories, and follow up screening recommendations. We will be in touch to schedule your follow up screening annually or based on recommendations of our providers. We will fax a copy of your scan results to your Primary Care Physician, or the physician who referred you to the program, to ensure they have the results. Please call the office if you have any questions or concerns regarding your scanning experience or results.  Our office number is [phone]. Please speak with Denise Phelps, RN. , or  Denise Buckner RN, They are  our Lung Cancer Screening RN.'s If They are unavailable when you call, Please leave a message on the voice mail. We will return your call at our earliest convenience.This voice mail is monitored several times a day.  Remember, if your scan is normal, we will scan you annually as long as you continue to meet the criteria for the program. (Age 55-77, Current smoker or smoker who has quit within the last 15 years). If you are a smoker, remember, quitting is the single most powerful action that you can take to decrease your risk of lung cancer and other pulmonary, breathing related problems. We know quitting is hard, and we are here to help.  Please let us know if there is anything we can do to help you meet your goal of quitting. If you are a former smoker, congratulations. We are proud of you! Remain smoke free! Remember you can refer friends or family members through the number above.  We will screen them to make sure they meet criteria for the program. Thank you for helping us take better care of you by  participating in Lung Screening.  You can receive free nicotine replacement therapy ( patches, gum or mints) by calling 1-800-QUIT NOW. Please call so we can get you on the path to becoming  a non-smoker. I know it is hard, but you can do this!  Lung RADS Categories:  Lung RADS 1: no nodules or definitely non-concerning nodules.  Recommendation is for a repeat annual scan in 12 months.  Lung RADS 2:  nodules that are non-concerning in appearance and behavior with a very low likelihood of becoming an active cancer. Recommendation is for a repeat annual scan in 12 months.  Lung RADS 3: nodules that are probably non-concerning , includes nodules with a low likelihood of becoming an active cancer.  Recommendation is for a 6-month repeat screening scan. Often noted after an upper respiratory illness. We will be in touch to make sure you have no questions, and to schedule your 6-month scan.  Lung RADS 4 A: nodules with concerning findings, recommendation is most often for a follow up scan in 3 months or additional testing based on our provider's assessment of the scan. We will be in touch to make sure you have no questions and to schedule the recommended 3 month follow up scan.  Lung RADS 4 B:  indicates findings that are concerning. We will be in touch with you to schedule additional diagnostic testing based on our provider's  assessment of the scan.  Other options for assistance in smoking cessation (   As covered by your insurance benefits)  Hypnosis for smoking cessation  [employer] [phone]  Acupuncture for smoking cessation  [facility] [phone]   

## 2021-09-11 NOTE — Progress Notes (Signed)
Virtual Visit via Telephone Note  I connected with Mary Nicholson on 03/07/21 at  2:00 PM EST by telephone and verified that I am speaking with the correct person using two identifiers.  Location: Patient: Home Provider: Working from home   I discussed the limitations, risks, security and privacy concerns of performing an evaluation and management service by telephone and the availability of in person appointments. I also discussed with the patient that there may be a patient responsible charge related to this service. The patient expressed understanding and agreed to proceed.  Shared Decision Making Visit Lung Cancer Screening Program [phone])   Eligibility: Age 66 y.o. Pack Years Smoking History Calculation 62 (# packs/per year x # years smoked) Recent History of coughing up blood  no Unexplained weight loss? no ( >Than 15 pounds within the last 6 months ) Prior History Lung / other cancer no (Diagnosis within the last 5 years already requiring surveillance chest CT Scans). Smoking Status Current Smoker Former Smokers: Years since quit: NA  Quit Date: NA  Visit Components: Discussion included one or more decision making aids. yes Discussion included risk/benefits of screening. yes Discussion included potential follow up diagnostic testing for abnormal scans. yes Discussion included meaning and risk of over diagnosis. yes Discussion included meaning and risk of False Positives. yes Discussion included meaning of total radiation exposure. yes  Counseling Included: Importance of adherence to annual lung cancer LDCT screening. yes Impact of comorbidities on ability to participate in the program. yes Ability and willingness to under diagnostic treatment. yes  Smoking Cessation Counseling: Current Smokers:  Discussed importance of smoking cessation. yes Information about tobacco cessation classes and interventions provided to patient. yes Patient provided with "ticket"  for LDCT Scan. yes Symptomatic Patient. yes  Counseling(Intermediate counseling: > three minutes) [address]406 Diagnosis Code: Tobacco Use Z72.0 Asymptomatic Patient no  Counseling NA Former Smokers:  Discussed the importance of maintaining cigarette abstinence. yes Diagnosis Code: Personal History of Nicotine Dependence. U04.540 Information about tobacco cessation classes and interventions provided to patient. Yes Patient provided with "ticket" for LDCT Scan. yes Written Order for Lung Cancer Screening with LDCT placed in Epic. Yes (CT Chest Lung Cancer Screening Low Dose W/O CM) [id number] Z12.2-Screening of respiratory organs Z87.891-Personal history of nicotine dependence   I spent 25 minutes of face to face time with her discussing the risks and benefits of lung cancer screening. We viewed a power point together that explained in detail the above noted topics. We took the time to pause the power point at intervals to allow for questions to be asked and answered to ensure understanding. We discussed that she had taken the single most powerful action possible to decrease her risk of developing lung cancer when she quit smoking. I counseled her to remain smoke free, and to contact me if she ever had the desire to smoke again so that I can provide resources and tools to help support the effort to remain smoke free. We discussed the time and location of the scan, and that either  Mary Glassman RN or I will call with the results within  24-48 hours of receiving them. She has my card and contact information in the event her needs to speak with me, in addition to a copy of the power point we reviewed as a resource. She verbalized understanding of all of the above and had no further questions upon leaving the office.     I explained to the patient that there has been a  high incidence of coronary artery disease noted on these exams. I explained that this is a non-gated exam therefore degree or severity cannot  be determined. This patient is on statin therapy. I have asked the patient to follow-up with their PCP regarding any incidental finding of coronary artery disease and management with diet or medication as they feel is clinically indicated. The patient verbalized understanding of the above and had no further questions.   I spent 3 minutes counseling on smoking cessation and the health risks of continued tobacco abuse    Mary Nicholson D. Kenton Kingfisher, NP-C [facility] Personal contact information can be found on Amion  09/11/2021, 10:17 AM

## 2021-09-13 DIAGNOSIS — Z87891 Personal history of nicotine dependence: Secondary | ICD-10-CM | POA: Diagnosis not present

## 2021-09-14 DIAGNOSIS — R404 Transient alteration of awareness: Secondary | ICD-10-CM | POA: Diagnosis not present

## 2021-09-15 ENCOUNTER — Ambulatory Visit
Admission: RE | Admit: 2021-09-15 | Discharge: 2021-09-15 | Disposition: A | Discharge status: Home / Self Care | Payer: Medicare Other | Source: Ambulatory Visit | Attending: Acute Care | Admitting: Acute Care

## 2021-09-15 DIAGNOSIS — Z122 Encounter for screening for malignant neoplasm of respiratory organs: Secondary | ICD-10-CM | POA: Diagnosis not present

## 2021-09-15 DIAGNOSIS — Z87891 Personal history of nicotine dependence: Secondary | ICD-10-CM | POA: Diagnosis not present

## 2021-09-15 DIAGNOSIS — F17210 Nicotine dependence, cigarettes, uncomplicated: Secondary | ICD-10-CM | POA: Insufficient documentation

## 2021-09-19 ENCOUNTER — Other Ambulatory Visit: Payer: Self-pay | Admitting: Acute Care

## 2021-09-19 DIAGNOSIS — Z87891 Personal history of nicotine dependence: Secondary | ICD-10-CM

## 2021-09-19 DIAGNOSIS — F17210 Nicotine dependence, cigarettes, uncomplicated: Secondary | ICD-10-CM

## 2021-09-19 DIAGNOSIS — Z122 Encounter for screening for malignant neoplasm of respiratory organs: Secondary | ICD-10-CM

## 2021-09-20 ENCOUNTER — Ambulatory Visit
Admission: RE | Admit: 2021-09-20 | Discharge: 2021-09-20 | Disposition: A | Discharge status: Home / Self Care | Payer: Medicare Other | Source: Ambulatory Visit | Attending: Student in an Organized Health Care Education/Training Program | Admitting: Student in an Organized Health Care Education/Training Program

## 2021-09-20 ENCOUNTER — Ambulatory Visit
Payer: Medicare Other | Attending: Student in an Organized Health Care Education/Training Program | Admitting: Student in an Organized Health Care Education/Training Program

## 2021-09-20 ENCOUNTER — Encounter: Payer: Self-pay | Admitting: Student in an Organized Health Care Education/Training Program

## 2021-09-20 DIAGNOSIS — M5416 Radiculopathy, lumbar region: Secondary | ICD-10-CM | POA: Diagnosis not present

## 2021-09-20 DIAGNOSIS — M961 Postlaminectomy syndrome, not elsewhere classified: Secondary | ICD-10-CM | POA: Insufficient documentation

## 2021-09-20 DIAGNOSIS — G8929 Other chronic pain: Secondary | ICD-10-CM | POA: Diagnosis not present

## 2021-09-20 DIAGNOSIS — Z981 Arthrodesis status: Secondary | ICD-10-CM | POA: Insufficient documentation

## 2021-09-20 DIAGNOSIS — G894 Chronic pain syndrome: Secondary | ICD-10-CM | POA: Diagnosis not present

## 2021-09-20 MED ORDER — LIDOCAINE HCL 2 % IJ SOLN
20.0000 mL | Freq: Once | INTRAMUSCULAR | Status: AC
  Administered 2021-09-20: 400 mg

## 2021-09-20 MED ORDER — CEPHALEXIN 500 MG PO CAPS: 500.0000 mg | ORAL_CAPSULE | Freq: Four times a day (QID) | ORAL | 0 refills | Status: AC

## 2021-09-20 MED ORDER — SODIUM CHLORIDE (PF) 0.9 % IJ SOLN
INTRAMUSCULAR | Status: AC
  Filled 2021-09-20: qty 10

## 2021-09-20 MED ORDER — FENTANYL CITRATE (PF) 100 MCG/2ML IJ SOLN
INTRAMUSCULAR | Status: AC
  Filled 2021-09-20: qty 2

## 2021-09-20 MED ORDER — MIDAZOLAM BOLUS VIA INFUSION: 1.0000 mg | INTRAVENOUS | Status: DC | PRN

## 2021-09-20 MED ORDER — CEFAZOLIN SODIUM 1 G IJ SOLR
INTRAMUSCULAR | Status: AC
  Filled 2021-09-20: qty 20

## 2021-09-20 MED ORDER — MIDAZOLAM HCL 5 MG/5ML IJ SOLN
INTRAMUSCULAR | Status: AC
  Filled 2021-09-20: qty 5

## 2021-09-20 MED ORDER — FENTANYL CITRATE (PF) 100 MCG/2ML IJ SOLN
25.0000 ug | INTRAMUSCULAR | Status: DC | PRN
  Administered 2021-09-20: 75 ug via INTRAVENOUS

## 2021-09-20 MED ORDER — LIDOCAINE HCL 2 % IJ SOLN
INTRAMUSCULAR | Status: AC
  Filled 2021-09-20: qty 20

## 2021-09-20 MED ORDER — ROPIVACAINE HCL 2 MG/ML IJ SOLN
INTRAMUSCULAR | Status: AC
  Filled 2021-09-20: qty 20

## 2021-09-20 MED ORDER — CEFAZOLIN SODIUM-DEXTROSE 1-4 GM/50ML-% IV SOLN
1.0000 g | Freq: Once | INTRAVENOUS | Status: AC
  Administered 2021-09-20: 1 g via INTRAVENOUS

## 2021-09-20 MED ORDER — LACTATED RINGERS IV SOLN
1000.0000 mL | Freq: Once | INTRAVENOUS | Status: AC
  Administered 2021-09-20: 1000 mL via INTRAVENOUS

## 2021-09-20 NOTE — Patient Instructions (Addendum)
Today we did the following -We have done a Spinal Cord Stimulator Trial with [employer]  -As long as the leads are in place, do not bathe or shower. You may sponge bathe.  -While the lead is in place, please limit the bending, lifting, or twisting because the lead can move.  -The things we want to see is if your pain improves (and by what percentage), if you can do more activity (don't overdo it), and if you can use less of your "as needed" medicine. Do not stop long acting medicines like methadone, oxycontin, MS Contin, etc without checking with [place].  -It is VERY important that you pick up the antibiotics we prescribed, Keflex, on your way home from the trial and take them as prescribed(4 times a day), starting today, for as long as the lead is in place.  -The Spina Cord Stimulator Representative will be in contact with you while the lead is in place to make sure the trial goes as well as possible.  -Please contact us with any questions or concerns at any time during the trial.   -If you start running a fever over 100 degrees, have severe back pain, or new pain running down the legs, or drainage coming from the lead site, contact us immediately and/or go to the emergency room.  -Please do not restart any sort of medication that can thin your blood such as Aspirin, ibuprofen, motrin, aleve, plavix, coumadin, etc. If you aren't sure, call and ask.  -We will have you return in 7 days to have the lead removed. If this is successful, at that point we can go over the details about the permanent implant.  ____________________________________________________________________________________________  Post-Procedure Discharge Instructions  Instructions: Apply ice:  Purpose: This will minimize any swelling and discomfort after procedure.  When: Day of procedure, as soon as you get home. How: Fill a plastic sandwich bag with crushed ice. Cover it with a small towel and apply to injection site. How  long: (15 min on, 15 min off) Apply for 15 minutes then remove x 15 minutes.  Repeat sequence on day of procedure, until you go to bed. Apply heat:  Purpose: To treat any soreness and discomfort from the procedure. When: Starting the next day after the procedure. How: Apply heat to procedure site starting the day following the procedure. How long: May continue to repeat daily, until discomfort goes away. Food intake: Start with clear liquids (like water) and advance to regular food, as tolerated.  Physical activities: Keep activities to a minimum for the first 8 hours after the procedure. After that, then as tolerated. Driving: If you have received any sedation, be responsible and do not drive. You are not allowed to drive for 24 hours after having sedation. Blood thinner: (Applies only to those taking blood thinners) You may restart your blood thinner 6 hours after your procedure. Insulin: (Applies only to Diabetic patients taking insulin) As soon as you can eat, you may resume your normal dosing schedule. Infection prevention: Keep procedure site clean and dry. Shower daily and clean area with soap and water. Post-procedure Pain Diary: Extremely important that this be done correctly and accurately. Recorded information will be used to determine the next step in treatment. For the purpose of accuracy, follow these rules: Evaluate only the area treated. Do not report or include pain from an untreated area. For the purpose of this evaluation, ignore all other areas of pain, except for the treated area. After your procedure, avoid taking  a long nap and attempting to complete the pain diary after you wake up. Instead, set your alarm clock to go off every hour, on the hour, for the initial 8 hours after the procedure. Document the duration of the numbing medicine, and the relief you are getting from it. Do not go to sleep and attempt to complete it later. It will not be accurate. If you received sedation,  it is likely that you were given a medication that may cause amnesia. Because of this, completing the diary at a later time may cause the information to be inaccurate. This information is needed to plan your care. Follow-up appointment: Keep your post-procedure follow-up evaluation appointment after the procedure (usually 2 weeks for most procedures, 6 weeks for radiofrequencies). DO NOT FORGET to bring you pain diary with you.   Expect: (What should I expect to see with my procedure?) From numbing medicine (AKA: Local Anesthetics): Numbness or decrease in pain. You may also experience some weakness, which if present, could last for the duration of the local anesthetic. Onset: Full effect within 15 minutes of injected. Duration: It will depend on the type of local anesthetic used. On the average, 1 to 8 hours.  From steroids (Applies only if steroids were used): Decrease in swelling or inflammation. Once inflammation is improved, relief of the pain will follow. Onset of benefits: Depends on the amount of swelling present. The more swelling, the longer it will take for the benefits to be seen. In some cases, up to 10 days. Duration: Steroids will stay in the system x 2 weeks. Duration of benefits will depend on multiple posibilities including persistent irritating factors. Side-effects: If present, they may typically last 2 weeks (the duration of the steroids). Frequent: Cramps (if they occur, drink Gatorade and take over-the-counter Magnesium 450-500 mg once to twice a day); water retention with temporary weight gain; increases in blood sugar; decreased immune system response; increased appetite. Occasional: Facial flushing (red, warm cheeks); mood swings; menstrual changes. Uncommon: Long-term decrease or suppression of natural hormones; bone thinning. (These are more common with higher doses or more frequent use. This is why we prefer that our patients avoid having any injection therapies in other  practices.)  Very Rare: Severe mood changes; psychosis; aseptic necrosis. From procedure: Some discomfort is to be expected once the numbing medicine wears off. This should be minimal if ice and heat are applied as instructed.  Call if: (When should I call?) You experience numbness and weakness that gets worse with time, as opposed to wearing off. New onset bowel or bladder incontinence. (Applies only to procedures done in the spine)  Emergency Numbers: Durning business hours (Monday - Thursday, 8:00 AM - 4:00 PM) (Friday, 9:00 AM - 12:00 Noon): [phone] After hours: [phone] NOTE: If you are having a problem and are unable connect with, or to talk to a provider, then go to your nearest urgent care or emergency department. If the problem is serious and urgent, please call 911. ____________________________________________________________________________________________

## 2021-09-20 NOTE — Progress Notes (Signed)
Safety precautions to be maintained throughout the outpatient stay will include: orient to surroundings, keep bed in low position, maintain call bell within reach at all times, provide assistance with transfer out of bed and ambulation.  

## 2021-09-20 NOTE — Progress Notes (Signed)
PROVIDER NOTE: Interpretation of information contained herein should be left to medically-trained personnel. Specific patient instructions are provided elsewhere under "Patient Instructions" section of medical record. This document was created in part using STT-dictation technology, any transcriptional errors that may result from this process are unintentional.  Patient: Mary Nicholson Type: Established DOB: 31-Oct-1955 MRN: [id number] PCP: Juline Patch, MD  Service: Procedure DOS: 09/20/2021 Setting: Ambulatory Location: Ambulatory outpatient facility Delivery: Face-to-face Provider: Gillis Santa, MD Specialty: Interventional Pain Management Specialty designation: 09 Location: Outpatient facility Ref. Prov.: Gillis Santa, MD    Primary Reason for Admission: Surgical management of chronic pain condition.   Procedure:              Type: Medtronic Trial Spinal Cord Neurostimulator Implant (Percutaneous, interlaminar, posterior epidural placement) Laterality: Bilateral (-50)  Level: Lumbar  Imaging: Fluoroscopic guidance Anesthesia: Local anesthesia (1-2% Lidocaine) Sedation: Moderate conscious sedation. DOS: 09/20/2021  Performed by: Gillis Santa, MD  Purpose: Diagnostic. To determine if a permanent implant may be effective in controlling some or all of Mary Nicholson's chronic pain symptoms.  Indications: failed back surgical syndrome, severe enough to impact quality of life or function. Rationale (medical necessity): procedure needed and proper for the diagnosis and/or treatment of Mary Nicholson's medical symptoms and needs. 1. Failed back surgical syndrome   2. Chronic radicular lumbar pain   3. Lumbar radiculopathy   4. History of lumbar fusion   5. Chronic pain syndrome    NAS-11 Pain score:   Pre-procedure: 8 /10   Post-procedure: 0-No pain/10     Target: Posterior epidural space over the dorsal columns of the spinal cord. Location: Posterior intraspinal  canal Region: Thoracolumbar  Approach: Translaminar percutaneous  Type of procedure: Surgical   Position / Prep / Materials:  Position: Prone  Prep solution: DuraPrep (Iodine Povacrylex [0.7% available iodine] and Isopropyl Alcohol, 74% w/w) Prep Area: Entire  Posterior  Thoracolumbar  Region  Materials:  Tray: Implant tray Needle(s):  Type: Epidural    Pre-op H&P Assessment:  Mary Nicholson is a 66 y.o. (year old), female patient, seen today for interventional treatment. She  has a past surgical history that includes Breast biopsy (Right); Abdominal hysterectomy; Cholecystectomy; Back surgery; and Esophagogastroduodenoscopy (egd) with propofol (N/A, 08/08/2021).  Initial Vital Signs:  Pulse/EKG Rate: 83ECG Heart Rate: 77 Temp: 97.7 F (36.5 C) Resp: 16 BP: (!) 150/80 SpO2: 96 %  BMI: Estimated body mass index is 25.51 kg/m as calculated from the following:   Height as of this encounter: '5\' 1"'$  (1.549 m).   Weight as of this encounter: 135 lb (61.2 kg).  Risk Assessment: Allergies: Reviewed. She has No Known Allergies.  Allergy Precautions: None required Coagulopathies: Reviewed. None identified.  Blood-thinner therapy: None at this time Active Infection(s): Reviewed. None identified. Mary Nicholson is afebrile  Site Confirmation: Mary Nicholson was asked to confirm the procedure and laterality before marking the site, which she did. Procedure checklist: Completed Consent: Before the procedure and under the influence of no sedative(s), amnesic(s), or anxiolytics, the patient was informed of the treatment options, risks and possible complications. To fulfill our ethical and legal obligations, as recommended by the American Medical Association's Code of Ethics, I have informed the patient of my clinical impression; the nature and purpose of the treatment or procedure; the risks, benefits, and possible complications of the intervention; the alternatives, including doing  nothing; the risk(s) and benefit(s) of the alternative treatment(s) or procedure(s); and the risk(s) and benefit(s)  of doing nothing.  Mary Nicholson was provided with information about the general risks and possible complications associated with most interventional procedures. These include, but are not limited to: failure to achieve desired goals, infection, bleeding, organ or nerve damage, allergic reactions, paralysis, and/or death.  In addition, she was informed of those risks and possible complications associated to this particular procedure, which include, but are not limited to: damage to the implant; failure to decrease pain; local, systemic, or serious CNS infections, intraspinal abscess with possible cord compression and paralysis, or life-threatening such as meningitis; intrathecal and/or epidural bleeding with formation of hematoma with possible spinal cord compression and permanent paralysis; organ damage; nerve injury or damage with subsequent sensory, motor, and/or autonomic system dysfunction, resulting in transient or permanent pain, numbness, and/or weakness of one or several areas of the body; allergic reactions, either minor or major life-threatening, such as anaphylactic or anaphylactoid reactions.  Furthermore, Mary Nicholson was informed of those risks and complications associated with the medications. These include, but are not limited to: allergic reactions (i.e.: anaphylactic or anaphylactoid reactions); arrhythmia;  Hypotension/hypertension; cardiovascular collapse; respiratory depression and/or shortness of breath; swelling or edema; medication-induced neural toxicity; particulate matter embolism and blood vessel occlusion with resultant organ, and/or nervous system infarction and permanent paralysis.  Finally, she was informed that Medicine is not an exact science; therefore, there is also the possibility of unforeseen or unpredictable risks and/or possible complications  that may result in a catastrophic outcome. The patient indicated having understood very clearly. We have given the patient no guarantees and we have made no promises. Enough time was given to the patient to ask questions, all of which were answered to the patient's satisfaction. Mary Nicholson has indicated that she wanted to continue with the procedure. Attestation: I, the ordering provider, attest that I have discussed with the patient the benefits, risks, side-effects, alternatives, likelihood of achieving goals, and potential problems during recovery for the procedure that I have provided informed consent. Date  Time: 09/20/2021  8:05 AM  Pre-Procedure Preparation:  Monitoring: As per clinic protocol. Respiration, ETCO2, SpO2, BP, heart rate and rhythm monitor placed and checked for adequate function Safety Precautions: Patient was assessed for positional comfort and pressure points before starting the procedure. Time-out: I initiated and conducted the "Time-out" before starting the procedure, as per protocol. The patient was asked to participate by confirming the accuracy of the "Time Out" information. Verification of the correct person, site, and procedure were performed and confirmed by me, the nursing staff, and the patient. "Time-out" conducted as per Joint Commission's Universal Protocol (UP.01.01.01). Time: [phone]  Description/Narrative of Procedure:          Rationale (medical necessity): procedure needed and proper for the diagnosis and/or treatment of the patient's medical symptoms and needs. Procedural Technique Safety Precautions: Aspiration looking for blood return was conducted prior to all injections. At no point did we inject any substances, as a needle was being advanced. No attempts were made at seeking any paresthesias. Safe injection practices and needle disposal techniques used. Medications properly checked for expiration dates. SDV (single dose vial) medications  used. Description of the Procedure: Protocol guidelines were followed. The patient was assisted into a comfortable position. The target area was identified and the area prepped in the usual manner. Skin & deeper tissues infiltrated with local anesthetic. Appropriate amount of time allowed to pass for local anesthetics to take effect. The procedure needles were then advanced to the target area. Proper  needle placement secured. Negative aspiration confirmed. Solution injected in intermittent fashion, asking for systemic symptoms every 0.5cc of injectate. The needles were then removed and the area cleansed, making sure to leave some of the prepping solution back to take advantage of its long term bactericidal properties.  Technical description of procedure: Availability of a responsible, adult driver, and NPO status confirmed. Informed consent was obtained after having discussed risks and possible complications. An IV was started. The patient was then taken to the fluoroscopy suite, where the patient was placed in position for the procedure, over the fluoroscopy table. The patient was then monitored in the usual manner. Fluoroscopy was manipulated to obtain the best possible view of the target. Parallex error was corrected before commencing the procedure. Once a clear view of the target had been obtained, the skin and deeper tissues over the procedure site were infiltrated using lidocaine, loaded in a 10 cc luer-loc syringe with a 0.5 inch, 25-G needle. The introducer needle(s) was/were then inserted through the skin and deeper tissues. A paramidline approach was used to enter the posterior epidural space at a 30 angle, using "Loss-of-resistance Technique" with 3 ml of PF-NaCl (0.9% NSS). Correct needle placement was confirmed in the antero-posterior and lateral fluoroscopic views. The lead was gently introduced and manipulated under real-time fluoroscopy, constantly assessing for pain, discomfort, or  paresthesias, until the tip rested at the desired level. Both sides were done in identical fashion. Electrode placement was tested until appropriate coverage was attained. Once the patient confirmed that the stimulation was over the desired area, the lead(s) was/were secured in place and the introducer needles removed. This was done under real-time fluoroscopy while observing the electrode tip to avoid unintended migration. The area was covered with a non-occlusive dressing and the patient transported to recovery for further programming.  Vitals:   09/20/21 0932 09/20/21 0942 09/20/21 0952 09/20/21 1002  BP: (!) 133/94 (!) 144/82 140/78 (!) 153/75  Pulse:      Resp: '16 13 15 16  '$ Temp:      TempSrc:      SpO2: 98% 97% 96% 99%  Weight:      Height:        Start Time: 0846 hrs. End Time: 0932 hrs.  Neurostimulator Details:   Lead(s):  Brand: Medtronic         Epidural Access Level:  T12-L1 T12-L1  Lead implant:  Bilateral   No. of Electrodes/Lead:  16 16  Laterality:  Right Left  Top electrode location:  T8 (top) T8 (mid)  Model No.: [id number] Same  Length: 50cm Same  Lot No.: [id number]  MRI compatibility:  Yes Yes   Imaging Guidance (Spinal):          Type of Imaging Technique: Fluoroscopy Guidance (Spinal) Indication(s): Assistance in needle guidance and placement for procedures requiring needle placement in or near specific anatomical locations not easily accessible without such assistance. Exposure Time: Please see nurses notes. Contrast: None used. Fluoroscopic Guidance: I was personally present during the use of fluoroscopy. "Tunnel Vision Technique" used to obtain the best possible view of the target area. Parallax error corrected before commencing the procedure. "Direction-depth-direction" technique used to introduce the needle under continuous pulsed fluoroscopy. Once target was reached, antero-posterior, oblique, and lateral fluoroscopic projection used confirm  needle placement in all planes. Images permanently stored in EMR. Interpretation: No contrast injected. I personally interpreted the imaging intraoperatively. Adequate needle placement confirmed in multiple planes. Permanent images saved into the patient's record.  Antibiotic Prophylaxis:   Anti-infectives (From admission, onward)    Start     Dose/Rate Route Frequency Ordered Stop   09/20/21 0845  ceFAZolin (ANCEF) IVPB 1 g/50 mL premix        1 g 100 mL/hr over 30 Minutes Intravenous  Once 09/20/21 0840 09/20/21 0911   09/20/21 0000  cephALEXin (KEFLEX) 500 MG capsule        500 mg Oral 4 times daily 09/20/21 0818 09/27/21 2359      Indication(s): Procedural Prophylaxis.  Post-operative Assessment:  Post-procedure Vital Signs:  Pulse/HCG Rate: 8374 (nsr) Temp: 97.7 F (36.5 C) Resp: 16 BP: (!) 153/75 SpO2: 99 %  Complications: No immediate post-treatment complications observed by team, or reported by patient.  Note: The patient tolerated the entire procedure well. A repeat set of vitals were taken after the procedure and the patient was kept under observation following institutional policy, for this type of procedure. Post-procedural neurological assessment was performed, showing return to baseline, prior to discharge. The patient was provided with post-procedure discharge instructions, including a section on how to identify potential problems. Should any problems arise concerning this procedure, the patient was given instructions to immediately contact us, at any time, without hesitation. In any case, we plan to contact the patient by telephone for a follow-up status report regarding this interventional procedure.  Comments:  No additional relevant information.  Plan of Care  Orders:  Orders Placed This Encounter  Procedures   DG PAIN CLINIC C-ARM 1-60 MIN NO REPORT    Intraoperative interpretation by procedural physician at [place].    Standing  Status:   Standing    Number of Occurrences:   1    Order Specific Question:   Reason for exam:    Answer:   Assistance in needle guidance and placement for procedures requiring needle placement in or near specific anatomical locations not easily accessible without such assistance.   Requested Prescriptions   Signed Prescriptions Disp Refills   cephALEXin (KEFLEX) 500 MG capsule 28 capsule 0    Sig: Take 1 capsule (500 mg total) by mouth 4 (four) times daily for 7 days.     Chronic Opioid Analgesic:   Medications administered: We administered lidocaine, lactated ringers, ceFAZolin, and fentaNYL.  See the medical record for exact dosing, route, and time of administration.  Follow-up plan:   Return in about 1 week (around 09/27/2021) for SCS lead pull.     Recent Visits Date Type Provider Dept  08/23/21 Office Visit Gillis Santa, MD [facility]  Showing recent visits within past 90 days and meeting all other requirements Today's Visits Date Type Provider Dept  09/20/21 Procedure visit Gillis Santa, MD [facility]  Showing today's visits and meeting all other requirements Future Appointments Date Type Provider Dept  09/27/21 Appointment Gillis Santa, MD [facility]  Showing future appointments within next 90 days and meeting all other requirements  Disposition: Discharge home  Discharge (Date  Time): 09/20/2021; 1008 hrs.   Primary Care Physician: Juline Patch, MD Location: [facility] Note by: Gillis Santa, MD Date: 09/20/2021; Time: 11:12 AM

## 2021-09-21 ENCOUNTER — Telehealth: Payer: Self-pay | Admitting: *Deleted

## 2021-09-21 NOTE — Telephone Encounter (Signed)
Attempted to call for post procedure follow-up. Message left. 

## 2021-09-27 ENCOUNTER — Encounter: Payer: Self-pay | Admitting: Student in an Organized Health Care Education/Training Program

## 2021-09-27 ENCOUNTER — Ambulatory Visit
Admission: RE | Admit: 2021-09-27 | Discharge: 2021-09-27 | Disposition: A | Discharge status: Home / Self Care | Payer: Medicare Other | Source: Ambulatory Visit | Attending: Student in an Organized Health Care Education/Training Program | Admitting: Student in an Organized Health Care Education/Training Program

## 2021-09-27 ENCOUNTER — Other Ambulatory Visit: Payer: Self-pay

## 2021-09-27 ENCOUNTER — Ambulatory Visit
Payer: Medicare Other | Attending: Student in an Organized Health Care Education/Training Program | Admitting: Student in an Organized Health Care Education/Training Program

## 2021-09-27 VITALS — BP 158/83 | HR 82 | Temp 97.9°F | Resp 16 | Ht 61.0 in | Wt 135.0 lb

## 2021-09-27 DIAGNOSIS — M961 Postlaminectomy syndrome, not elsewhere classified: Secondary | ICD-10-CM | POA: Insufficient documentation

## 2021-09-27 DIAGNOSIS — M5416 Radiculopathy, lumbar region: Secondary | ICD-10-CM | POA: Diagnosis not present

## 2021-09-27 DIAGNOSIS — Z981 Arthrodesis status: Secondary | ICD-10-CM | POA: Diagnosis not present

## 2021-09-27 DIAGNOSIS — G8929 Other chronic pain: Secondary | ICD-10-CM | POA: Insufficient documentation

## 2021-09-27 NOTE — Progress Notes (Signed)
PROVIDER NOTE: Information contained herein reflects review and annotations entered in association with encounter. Interpretation of such information and data should be left to medically-trained personnel. Information provided to patient can be located elsewhere in the medical record under "Patient Instructions". Document created using STT-dictation technology, any transcriptional errors that may result from process are unintentional.    Patient: Mary Nicholson  Service Category: E/M  Provider: Gillis Santa, MD  DOB: 03-09-1956  DOS: 09/27/2021  Specialty: Interventional Pain Management  MRN: [id number]  Setting: Ambulatory outpatient  PCP: Juline Patch, MD  Type: Established Patient    Referring Provider: Juline Patch, MD  Location: Office  Delivery: Face-to-face     HPI  [name], a 66 y.o. year old female, is here today because of her Failed back surgical syndrome [M96.1]. [name] primary complain today is Back Pain (Lumbar bilateral mainly down left leg ) Last encounter: My last encounter with her was on 09/20/2021. Pertinent problems: [name] has Type 2 diabetes mellitus with diabetic neuropathy ([place]); Failed back surgical syndrome; Chronic radicular lumbar pain; History of lumbar fusion; and Chronic pain syndrome on their pertinent problem list. Pain Assessment: Severity of Chronic pain is reported as a 4 /10. Location: Back Lower, Left, Right/down left leg. Onset: More than a month ago. Quality: Aching, Discomfort. Timing: Constant. Modifying factor(s): SCS has helped the pain. Vitals:  height is 5' 1"  (1.549 m) and weight is 135 lb (61.2 kg). Her temporal temperature is 97.9 F (36.6 C). Her blood pressure is 158/83 (abnormal) and her pulse is 82. Her respiration is 16 and oxygen saturation is 97%.   Reason for encounter:  medtronic SCS lead pull after successful SCS trial .   Patient presents today for removal of her Medtronic spinal cord  stimulator trial leads.  She endorses significant analgesic and functional improvement during the duration of her trial rated as approximately 85 to 90% in her low back and left leg.  She states that she was able to perform ADLs more comfortably and was more involved and engaged with family members.  Given successful SCS trial, will place referral to Dr. Cari Caraway to discuss and schedule permanent implant.  ROS  Constitutional: Denies any fever or chills Gastrointestinal: No reported hemesis, hematochezia, vomiting, or acute GI distress Musculoskeletal:  Improvement in low back and left leg pain during SCS trial Neurological: No reported episodes of acute onset apraxia, aphasia, dysarthria, agnosia, amnesia, paralysis, loss of coordination, or loss of consciousness  Medication Review  Acetaminophen, Biotin, Cetirizine HCl, Fluticasone-Umeclidin-Vilant, Multiple Vitamins-Minerals, albuterol, amLODipine, aspirin, atorvastatin, b complex vitamins, busPIRone, cephALEXin, doxycycline, famotidine, fluticasone, glipiZIDE, glucose blood, hydrOXYzine, hydrochlorothiazide, lamoTRIgine, levothyroxine, metFORMIN, metoprolol succinate, mupirocin ointment, traZODone, and venlafaxine XR  History Review  Allergy: [name] has No Known Allergies. Drug: [name]  reports no history of drug use. Alcohol:  reports current alcohol use. Tobacco:  reports that she has been smoking cigarettes. She has a 57.00 pack-year smoking history. She has never used smokeless tobacco. Social: [name]  reports that she has been smoking cigarettes. She has a 57.00 pack-year smoking history. She has never used smokeless tobacco. She reports current alcohol use. She reports that she does not use drugs. Medical:  has a past medical history of Bipolar 1 disorder ([place]), Cancer ([place]), COPD (chronic obstructive pulmonary disease) ([place]), Diabetes mellitus without complication ([place]), Dyspnea, GERD (gastroesophageal  reflux disease), right breast biopsy (1988), Hyperlipidemia, Hypertension, PTSD (post-traumatic stress disorder),  Schizophrenia ([place]), Spinal stenosis, Stroke [facility]), and Thyroid disease. Surgical: Ms. Maree Ainley  has a past surgical history that includes Breast biopsy (Right); Abdominal hysterectomy; Cholecystectomy; Back surgery; and Esophagogastroduodenoscopy (egd) with propofol (N/A, 08/08/2021). Family: family history includes Alzheimer's disease in her father; COPD in her father; Diabetes in her mother; Heart failure in her mother; Ovarian cancer (age of onset: 27) in her mother.  Laboratory Chemistry Profile   Renal Lab Results  Component Value Date   BUN 12 05/22/2021   CREATININE 0.64 05/22/2021   BCR 19 05/22/2021   GFRAA >60 11/14/2019   GFRNONAA >60 10/02/2020    Hepatic Lab Results  Component Value Date   AST 16 07/22/2020   ALT 21 07/22/2020   ALBUMIN 4.8 05/22/2021   ALKPHOS 103 07/22/2020   LIPASE 30 04/20/2020    Electrolytes Lab Results  Component Value Date   NA 143 05/22/2021   K 4.7 05/22/2021   CL 101 05/22/2021   CALCIUM 10.1 05/22/2021   MG 1.8 04/20/2020   PHOS 4.3 05/22/2021    Bone No results found for: VD25OH, VD125OH2TOT, DU2025KY7, CW2376EG3, 25OHVITD1, 25OHVITD2, 25OHVITD3, TESTOFREE, TESTOSTERONE  Inflammation (CRP: Acute Phase) (ESR: Chronic Phase) Lab Results  Component Value Date   LATICACIDVEN 1.0 11/12/2019         Note: Above Lab results reviewed.  Recent Imaging Review  DG PAIN CLINIC C-ARM 1-60 MIN NO REPORT Fluoro was used, but no Radiologist interpretation will be provided.  Please refer to "NOTES" tab for provider progress note. Note: Reviewed        Physical Exam  General appearance: Well nourished, well developed, and well hydrated. In no apparent acute distress Mental status: Alert, oriented x 3 (person, place, & time)       Respiratory: No evidence of acute respiratory distress Eyes: PERLA Vitals: BP (!) 158/83 (BP  Location: Left Arm, Patient Position: Sitting, Cuff Size: Normal)   Pulse 82   Temp 97.9 F (36.6 C) (Temporal)   Resp 16   Ht 5' 1"  (1.549 m)   Wt 135 lb (61.2 kg)   SpO2 97%   BMI 25.51 kg/m  BMI: Estimated body mass index is 25.51 kg/m as calculated from the following:   Height as of this encounter: 5' 1"  (1.549 m).   Weight as of this encounter: 135 lb (61.2 kg). Ideal: Ideal body weight: 47.8 kg (105 lb 6.1 oz) Adjusted ideal body weight: 53.2 kg (117 lb 3.7 oz)  Improvement in low back and left leg pain during duration of trial.  Improved range of motion.  Spinal cord stimulator trial leads removed under live fluoroscopy with tips intact.  Nursing staff and Medtronic representative present.  Assessment   Diagnosis  1. Failed back surgical syndrome   2. Chronic radicular lumbar pain   3. Lumbar radiculopathy   4. History of lumbar fusion      Updated Problems: No problems updated.  Plan of Care  Status post successful Medtronic spinal cord stimulator trial, referral to Dr. Cari Caraway for permanent implant.  I will be happy to see the patient back for any further management if she needs it.   Orders:  Orders Placed This Encounter  Procedures   DG PAIN CLINIC C-ARM 1-60 MIN NO REPORT    Intraoperative interpretation by procedural physician at [place].    Standing Status:   Standing    Number of Occurrences:   1    Order Specific Question:   Reason for exam:  Answer:   Assistance in needle guidance and placement for procedures requiring needle placement in or near specific anatomical locations not easily accessible without such assistance.   Ambulatory referral to Neurosurgery    Referral Priority:   Routine    Referral Type:   Surgical    Referral Reason:   Specialty Services Required    Referred to Provider:   Meade Maw, MD    Requested Specialty:   Neurosurgery    Number of Visits Requested:   1   Follow-up plan:   Return if  symptoms worsen or fail to improve.    Recent Visits Date Type Provider Dept  09/20/21 Procedure visit Gillis Santa, MD [facility]  08/23/21 Office Visit Gillis Santa, MD [facility]  Showing recent visits within past 90 days and meeting all other requirements Today's Visits Date Type Provider Dept  09/27/21 Procedure visit Gillis Santa, MD [facility]  Showing today's visits and meeting all other requirements Future Appointments No visits were found meeting these conditions. Showing future appointments within next 90 days and meeting all other requirements  I discussed the assessment and treatment plan with the patient. The patient was provided an opportunity to ask questions and all were answered. The patient agreed with the plan and demonstrated an understanding of the instructions.  Patient advised to call back or seek an in-person evaluation if the symptoms or condition worsens.  Duration of encounter: 20 minutes.  Note by: Gillis Santa, MD Date: 09/27/2021; Time: 11:00 AM

## 2021-10-01 ENCOUNTER — Other Ambulatory Visit: Payer: Self-pay | Admitting: Family Medicine

## 2021-10-12 DIAGNOSIS — G894 Chronic pain syndrome: Secondary | ICD-10-CM | POA: Diagnosis not present

## 2021-10-13 ENCOUNTER — Other Ambulatory Visit: Payer: Self-pay | Admitting: Family Medicine

## 2021-10-13 DIAGNOSIS — E1140 Type 2 diabetes mellitus with diabetic neuropathy, unspecified: Secondary | ICD-10-CM

## 2021-10-13 DIAGNOSIS — E7801 Familial hypercholesterolemia: Secondary | ICD-10-CM

## 2021-10-13 DIAGNOSIS — E039 Hypothyroidism, unspecified: Secondary | ICD-10-CM

## 2021-10-13 DIAGNOSIS — I10 Essential (primary) hypertension: Secondary | ICD-10-CM

## 2021-10-20 ENCOUNTER — Other Ambulatory Visit: Payer: Self-pay | Admitting: Neurosurgery

## 2021-10-20 DIAGNOSIS — Z01818 Encounter for other preprocedural examination: Secondary | ICD-10-CM

## 2021-10-22 ENCOUNTER — Other Ambulatory Visit: Payer: Self-pay | Admitting: Family Medicine

## 2021-10-22 DIAGNOSIS — J449 Chronic obstructive pulmonary disease, unspecified: Secondary | ICD-10-CM

## 2021-10-22 DIAGNOSIS — R1319 Other dysphagia: Secondary | ICD-10-CM

## 2021-10-22 DIAGNOSIS — K219 Gastro-esophageal reflux disease without esophagitis: Secondary | ICD-10-CM

## 2021-10-31 ENCOUNTER — Encounter
Admission: RE | Admit: 2021-10-31 | Discharge: 2021-10-31 | Disposition: A | Discharge status: Home / Self Care | Payer: Medicare Other | Source: Ambulatory Visit | Attending: Neurosurgery | Admitting: Neurosurgery

## 2021-10-31 ENCOUNTER — Other Ambulatory Visit: Payer: Self-pay

## 2021-10-31 VITALS — BP 150/71 | HR 76 | Resp 16 | Ht 61.0 in | Wt 134.3 lb

## 2021-10-31 DIAGNOSIS — Z01810 Encounter for preprocedural cardiovascular examination: Secondary | ICD-10-CM | POA: Diagnosis not present

## 2021-10-31 DIAGNOSIS — E1140 Type 2 diabetes mellitus with diabetic neuropathy, unspecified: Secondary | ICD-10-CM

## 2021-10-31 DIAGNOSIS — Z01812 Encounter for preprocedural laboratory examination: Secondary | ICD-10-CM

## 2021-10-31 DIAGNOSIS — Z01818 Encounter for other preprocedural examination: Secondary | ICD-10-CM | POA: Insufficient documentation

## 2021-10-31 HISTORY — DX: Unspecified osteoarthritis, unspecified site: M19.90

## 2021-10-31 HISTORY — DX: Hypothyroidism, unspecified: E03.9

## 2021-10-31 HISTORY — DX: Chronic kidney disease, unspecified: N18.9

## 2021-10-31 HISTORY — DX: Pneumonia, unspecified organism: J18.9

## 2021-10-31 HISTORY — DX: Anemia, unspecified: D64.9

## 2021-10-31 HISTORY — DX: Personal history of urinary calculi: Z87.442

## 2021-10-31 LAB — BASIC METABOLIC PANEL
Anion gap: 7 (ref 5–15)
BUN: 13 mg/dL (ref 8–23)
CO2: 26 mmol/L (ref 22–32)
Calcium: 9.7 mg/dL (ref 8.9–10.3)
Chloride: 105 mmol/L (ref 98–111)
Creatinine, Ser: 0.77 mg/dL (ref 0.44–1.00)
GFR, Estimated: >60 mL/min (ref >60)
Glucose, Bld: 124 mg/dL — ABNORMAL HIGH (ref 70–99)
Potassium: 4.8 mmol/L (ref 3.5–5.1)
Sodium: 138 mmol/L (ref 135–145)

## 2021-10-31 LAB — SURGICAL PCR SCREEN
MRSA, PCR: NEGATIVE
Staphylococcus aureus: NEGATIVE

## 2021-10-31 LAB — CBC
HCT: 42.8 % (ref 36.0–46.0)
Hemoglobin: 14 g/dL (ref 12.0–15.0)
MCH: 29.8 pg (ref 26.0–34.0)
MCHC: 32.7 g/dL (ref 30.0–36.0)
MCV: 91.1 fL (ref 80.0–100.0)
Platelets: 359 10*3/uL (ref 150–400)
RBC: 4.7 MIL/uL (ref 3.87–5.11)
RDW: 14.3 % (ref 11.5–15.5)
WBC: 7.4 10*3/uL (ref 4.0–10.5)
nRBC: 0 % (ref 0.0–0.2)

## 2021-10-31 NOTE — Patient Instructions (Addendum)
Your procedure is scheduled on: 11/08/21 - Wednesday Report to the Registration Desk on the 1st floor of the [facility]. To find out your arrival time, please call [phone] between 1PM - 3PM on: 11/07/21 - Tuesday If your arrival time is 6:00 am, do not arrive prior to that time as the [facility] entrance doors do not open until 6:00 am.  REMEMBER: Instructions that are not followed completely may result in serious medical risk, up to and including death; or upon the discretion of your surgeon and anesthesiologist your surgery may need to be rescheduled.  Do not eat food after midnight the night before surgery.  No gum chewing, lozengers or hard candies.  You may however, drink CLEAR liquids up to 2 hours before you are scheduled to arrive for your surgery. Do not drink anything within 2 hours of your scheduled arrival time.  Clear liquids include: - water  Type 1 and Type 2 diabetics should only drink water.  TAKE ONLY THESE MEDICATIONS THE MORNING OF SURGERY WITH A SIP OF WATER:  - Acetaminophen (TYLENOL ARTHRITIS ) - amLODipine (NORVASC) 5 MG tablet - busPIRone (BUSPAR)  - doxycycline (VIBRA-TABS) 100 MG tablet - famotidine (PEPCID) 40 MG tablet, (take one the night before and one on the morning of surgery - helps to prevent nausea after surgery.) - fluticasone (FLONASE) - hydrOXYzine (ATARAX) 25  - levothyroxine (SYNTHROID) - metoprolol succinate (TOPROL-XL) 50 MG 24 hr tablet - venlafaxine XR (EFFEXOR-XR) 75 MG 24 hr capsule - TRELEGY ELLIPTA 100-62.5-25 MCG/ACT AEPB  Use albuterol (VENTOLIN HFA) 108 (90 Base) MCG/ACT inhaler on the day of surgery and bring to the hospital.   - STOP Taking metFORMIN (GLUCOPHAGE-XR) 500 MG 24 hr tablet beginning 11/06/21, may resume taking the day after surgery.  One week prior to surgery: Stop Anti-inflammatories (NSAIDS) such as Advil, Aleve, Ibuprofen, Motrin, Naproxen, Naprosyn and Aspirin based products such as Excedrin, Goodys  Powder, BC Powder.  Stop ANY OVER THE COUNTER supplements until after surgery.b complex vitamins capsule, Biotin 5 MG , Multiple Vitamins.  You may however, continue to take Tylenol if needed for pain up until the day of surgery.  No Alcohol for 24 hours before or after surgery.  No Smoking including e-cigarettes for 24 hours prior to surgery.  No chewable tobacco products for at least 6 hours prior to surgery.  No nicotine patches on the day of surgery.  Do not use any "recreational" drugs for at least a week prior to your surgery.  Please be advised that the combination of cocaine and anesthesia may have negative outcomes, up to and including death. If you test positive for cocaine, your surgery will be cancelled.  On the morning of surgery brush your teeth with toothpaste and water, you may rinse your mouth with mouthwash if you wish. Do not swallow any toothpaste or mouthwash.  Use CHG Soap or wipes as directed on instruction sheet.  Do not wear jewelry, make-up, hairpins, clips or nail polish.  Do not wear lotions, powders, or perfumes.   Do not shave body from the neck down 48 hours prior to surgery just in case you cut yourself which could leave a site for infection.  Also, freshly shaved skin may become irritated if using the CHG soap.  Contact lenses, hearing aids and dentures may not be worn into surgery.  Do not bring valuables to the hospital. [facility] is not responsible for any missing/lost belongings or valuables.   Notify your doctor if  there is any change in your medical condition (cold, fever, infection).  Wear comfortable clothing (specific to your surgery type) to the hospital.  After surgery, you can help prevent lung complications by doing breathing exercises.  Take deep breaths and cough every 1-2 hours. Your doctor may order a device called an Incentive Spirometer to help you take deep breaths. When coughing or sneezing, hold a pillow firmly against  your incision with both hands. This is called "splinting." Doing this helps protect your incision. It also decreases belly discomfort.  If you are being admitted to the hospital overnight, leave your suitcase in the car. After surgery it may be brought to your room.  If you are being discharged the day of surgery, you will not be allowed to drive home. You will need a responsible adult (18 years or older) to drive you home and stay with you that night.   If you are taking public transportation, you will need to have a responsible adult (18 years or older) with you. Please confirm with your physician that it is acceptable to use public transportation.   Please call the [facility] Dept. at [phone] if you have any questions about these instructions.  Surgery Visitation Policy:  Patients undergoing a surgery or procedure may have two family members or support persons with them as long as the person is not COVID-19 positive or experiencing its symptoms.   Inpatient Visitation:    Visiting hours are 7 a.m. to 8 p.m. Up to four visitors are allowed at one time in a patient room, including children. The visitors may rotate out with other people during the day. One designated support person (adult) may remain overnight.

## 2021-11-05 ENCOUNTER — Other Ambulatory Visit: Payer: Self-pay | Admitting: Family Medicine

## 2021-11-07 LAB — TYPE AND SCREEN
ABO/RH(D): O POS
Antibody Screen: NEGATIVE

## 2021-11-08 ENCOUNTER — Other Ambulatory Visit: Payer: Self-pay

## 2021-11-08 ENCOUNTER — Ambulatory Visit: Payer: Medicare Other | Admitting: Anesthesiology

## 2021-11-08 ENCOUNTER — Ambulatory Visit: Payer: Medicare Other

## 2021-11-08 ENCOUNTER — Encounter: Payer: Self-pay | Admitting: Neurosurgery

## 2021-11-08 ENCOUNTER — Observation Stay
Admission: RE | Admit: 2021-11-08 | Discharge: 2021-11-09 | Disposition: A | Discharge status: Home / Self Care | Payer: Medicare Other | Source: Ambulatory Visit | Attending: Neurosurgery | Admitting: Neurosurgery

## 2021-11-08 ENCOUNTER — Encounter
Admission: RE | Disposition: A | Discharge status: Home / Self Care | Payer: Self-pay | Source: Ambulatory Visit | Attending: Neurosurgery

## 2021-11-08 DIAGNOSIS — G894 Chronic pain syndrome: Secondary | ICD-10-CM | POA: Diagnosis not present

## 2021-11-08 DIAGNOSIS — Z01812 Encounter for preprocedural laboratory examination: Secondary | ICD-10-CM

## 2021-11-08 DIAGNOSIS — R7309 Other abnormal glucose: Secondary | ICD-10-CM | POA: Insufficient documentation

## 2021-11-08 DIAGNOSIS — E1140 Type 2 diabetes mellitus with diabetic neuropathy, unspecified: Secondary | ICD-10-CM

## 2021-11-08 DIAGNOSIS — E1122 Type 2 diabetes mellitus with diabetic chronic kidney disease: Secondary | ICD-10-CM | POA: Diagnosis not present

## 2021-11-08 DIAGNOSIS — G8929 Other chronic pain: Secondary | ICD-10-CM | POA: Diagnosis present

## 2021-11-08 DIAGNOSIS — D631 Anemia in chronic kidney disease: Secondary | ICD-10-CM | POA: Diagnosis not present

## 2021-11-08 DIAGNOSIS — Z981 Arthrodesis status: Secondary | ICD-10-CM | POA: Diagnosis not present

## 2021-11-08 DIAGNOSIS — I129 Hypertensive chronic kidney disease with stage 1 through stage 4 chronic kidney disease, or unspecified chronic kidney disease: Secondary | ICD-10-CM | POA: Diagnosis not present

## 2021-11-08 DIAGNOSIS — N189 Chronic kidney disease, unspecified: Secondary | ICD-10-CM | POA: Diagnosis not present

## 2021-11-08 HISTORY — PX: THORACIC LAMINECTOMY FOR SPINAL CORD STIMULATOR: SHX6887

## 2021-11-08 LAB — POTASSIUM: Potassium: 4.2 mmol/L (ref 3.5–5.1)

## 2021-11-08 LAB — GLUCOSE, CAPILLARY
Glucose-Capillary: 153 mg/dL — ABNORMAL HIGH (ref 70–99)
Glucose-Capillary: 179 mg/dL — ABNORMAL HIGH (ref 70–99)

## 2021-11-08 LAB — ABO/RH: ABO/RH(D): O POS

## 2021-11-08 SURGERY — THORACIC LAMINECTOMY FOR SPINAL CORD STIMULATOR
Anesthesia: General | Site: Thoracic

## 2021-11-08 MED ORDER — ACETAMINOPHEN 10 MG/ML IV SOLN
INTRAVENOUS | Status: DC | PRN
  Administered 2021-11-08: 1000 mg via INTRAVENOUS

## 2021-11-08 MED ORDER — ACETAMINOPHEN 10 MG/ML IV SOLN
INTRAVENOUS | Status: AC
  Filled 2021-11-08: qty 100

## 2021-11-08 MED ORDER — PROPOFOL 10 MG/ML IV BOLUS
INTRAVENOUS | Status: DC | PRN
  Administered 2021-11-08: 50 mg via INTRAVENOUS
  Administered 2021-11-08: 100 mg via INTRAVENOUS

## 2021-11-08 MED ORDER — ACETAMINOPHEN 500 MG PO TABS
1000.0000 mg | ORAL_TABLET | Freq: Four times a day (QID) | ORAL | Status: AC
  Administered 2021-11-08 – 2021-11-09 (×4): 1000 mg via ORAL
  Filled 2021-11-08 (×5): qty 2

## 2021-11-08 MED ORDER — MIDAZOLAM HCL 2 MG/2ML IJ SOLN
INTRAMUSCULAR | Status: AC
  Filled 2021-11-08: qty 2

## 2021-11-08 MED ORDER — 0.9 % SODIUM CHLORIDE (POUR BTL) OPTIME
TOPICAL | Status: DC | PRN
  Administered 2021-11-08: 500 mL

## 2021-11-08 MED ORDER — EPHEDRINE 5 MG/ML INJ
INTRAVENOUS | Status: AC
  Filled 2021-11-08: qty 5

## 2021-11-08 MED ORDER — SODIUM CHLORIDE 0.9 % IV SOLN: INTRAVENOUS | Status: DC

## 2021-11-08 MED ORDER — ONDANSETRON HCL 4 MG/2ML IJ SOLN
4.0000 mg | Freq: Four times a day (QID) | INTRAMUSCULAR | Status: DC | PRN
Start: 2021-11-08 — End: 2021-11-09

## 2021-11-08 MED ORDER — VENLAFAXINE HCL ER 150 MG PO CP24
150.0000 mg | ORAL_CAPSULE | Freq: Every day | ORAL | Status: DC
  Administered 2021-11-09: 150 mg via ORAL
  Filled 2021-11-08: qty 1

## 2021-11-08 MED ORDER — ATORVASTATIN CALCIUM 20 MG PO TABS
80.0000 mg | ORAL_TABLET | Freq: Every day | ORAL | Status: DC
  Administered 2021-11-08: 80 mg via ORAL
  Filled 2021-11-08: qty 4

## 2021-11-08 MED ORDER — VANCOMYCIN HCL IN DEXTROSE 1-5 GM/200ML-% IV SOLN
1000.0000 mg | Freq: Once | INTRAVENOUS | Status: AC
  Administered 2021-11-08: 1000 mg via INTRAVENOUS

## 2021-11-08 MED ORDER — SODIUM CHLORIDE FLUSH 0.9 % IV SOLN
INTRAVENOUS | Status: AC
  Filled 2021-11-08: qty 20

## 2021-11-08 MED ORDER — BUPIVACAINE HCL (PF) 0.5 % IJ SOLN
INTRAMUSCULAR | Status: AC
  Filled 2021-11-08: qty 30

## 2021-11-08 MED ORDER — CEFAZOLIN SODIUM-DEXTROSE 2-4 GM/100ML-% IV SOLN
2.0000 g | Freq: Once | INTRAVENOUS | Status: AC
  Administered 2021-11-08: 2 g via INTRAVENOUS

## 2021-11-08 MED ORDER — ONDANSETRON HCL 4 MG/2ML IJ SOLN
INTRAMUSCULAR | Status: DC | PRN
  Administered 2021-11-08: 4 mg via INTRAVENOUS

## 2021-11-08 MED ORDER — B COMPLEX-C PO TABS
1.0000 | ORAL_TABLET | Freq: Every day | ORAL | Status: DC
  Administered 2021-11-08 – 2021-11-09 (×2): 1 via ORAL
  Filled 2021-11-08 (×2): qty 1

## 2021-11-08 MED ORDER — SODIUM CHLORIDE 0.9 % IV SOLN: 250.0000 mL | INTRAVENOUS | Status: DC

## 2021-11-08 MED ORDER — FENTANYL CITRATE (PF) 100 MCG/2ML IJ SOLN
INTRAMUSCULAR | Status: DC | PRN
  Administered 2021-11-08: 50 ug via INTRAVENOUS
  Administered 2021-11-08 (×2): 25 ug via INTRAVENOUS

## 2021-11-08 MED ORDER — ONDANSETRON HCL 4 MG/2ML IJ SOLN
INTRAMUSCULAR | Status: AC
  Filled 2021-11-08: qty 2

## 2021-11-08 MED ORDER — SENNOSIDES-DOCUSATE SODIUM 8.6-50 MG PO TABS: 1.0000 | ORAL_TABLET | Freq: Every evening | ORAL | Status: DC | PRN

## 2021-11-08 MED ORDER — FLUTICASONE FUROATE-VILANTEROL 100-25 MCG/ACT IN AEPB
1.0000 | INHALATION_SPRAY | Freq: Every day | RESPIRATORY_TRACT | Status: DC
  Administered 2021-11-08 – 2021-11-09 (×2): 1 via RESPIRATORY_TRACT
  Filled 2021-11-08: qty 28

## 2021-11-08 MED ORDER — FENTANYL CITRATE (PF) 100 MCG/2ML IJ SOLN: 25.0000 ug | INTRAMUSCULAR | Status: DC | PRN

## 2021-11-08 MED ORDER — SURGIRINSE WOUND IRRIGATION SYSTEM - OPTIME
TOPICAL | Status: DC | PRN
  Administered 2021-11-08: 450 mL via TOPICAL

## 2021-11-08 MED ORDER — DEXAMETHASONE SODIUM PHOSPHATE 10 MG/ML IJ SOLN
INTRAMUSCULAR | Status: DC | PRN
  Administered 2021-11-08: 6 mg via INTRAVENOUS

## 2021-11-08 MED ORDER — OXYCODONE HCL 5 MG PO TABS: 5.0000 mg | ORAL_TABLET | Freq: Once | ORAL | Status: DC | PRN

## 2021-11-08 MED ORDER — AMLODIPINE BESYLATE 5 MG PO TABS
5.0000 mg | ORAL_TABLET | Freq: Every day | ORAL | Status: DC
  Administered 2021-11-09: 5 mg via ORAL
  Filled 2021-11-08: qty 1

## 2021-11-08 MED ORDER — POLYVINYL ALCOHOL 1.4 % OP SOLN: 1.0000 [drp] | OPHTHALMIC | Status: DC | PRN

## 2021-11-08 MED ORDER — GLYCOPYRROLATE 0.2 MG/ML IJ SOLN
INTRAMUSCULAR | Status: DC | PRN
  Administered 2021-11-08: .2 mg via INTRAVENOUS

## 2021-11-08 MED ORDER — BUPIVACAINE LIPOSOME 1.3 % IJ SUSP
INTRAMUSCULAR | Status: AC
Start: 2021-11-08 — End: ?
  Filled 2021-11-08: qty 20

## 2021-11-08 MED ORDER — HYDROCHLOROTHIAZIDE 25 MG PO TABS
25.0000 mg | ORAL_TABLET | Freq: Every day | ORAL | Status: DC
  Administered 2021-11-08 – 2021-11-09 (×2): 25 mg via ORAL
  Filled 2021-11-08 (×2): qty 1

## 2021-11-08 MED ORDER — PHENYLEPHRINE 80 MCG/ML (10ML) SYRINGE FOR IV PUSH (FOR BLOOD PRESSURE SUPPORT)
PREFILLED_SYRINGE | INTRAVENOUS | Status: DC | PRN
  Administered 2021-11-08 (×2): 160 ug via INTRAVENOUS
  Administered 2021-11-08 (×2): 80 ug via INTRAVENOUS
  Administered 2021-11-08: 160 ug via INTRAVENOUS
  Administered 2021-11-08: 80 ug via INTRAVENOUS
  Administered 2021-11-08: 160 ug via INTRAVENOUS
  Administered 2021-11-08: 80 ug via INTRAVENOUS

## 2021-11-08 MED ORDER — SUCCINYLCHOLINE CHLORIDE 200 MG/10ML IV SOSY
PREFILLED_SYRINGE | INTRAVENOUS | Status: DC | PRN
  Administered 2021-11-08: 100 mg via INTRAVENOUS

## 2021-11-08 MED ORDER — ALBUTEROL SULFATE (2.5 MG/3ML) 0.083% IN NEBU: 3.0000 mL | INHALATION_SOLUTION | Freq: Four times a day (QID) | RESPIRATORY_TRACT | Status: DC | PRN

## 2021-11-08 MED ORDER — KETOROLAC TROMETHAMINE 15 MG/ML IJ SOLN
7.5000 mg | Freq: Four times a day (QID) | INTRAMUSCULAR | Status: AC
  Administered 2021-11-08 – 2021-11-09 (×4): 7.5 mg via INTRAVENOUS
  Filled 2021-11-08 (×4): qty 1

## 2021-11-08 MED ORDER — EPHEDRINE SULFATE (PRESSORS) 50 MG/ML IJ SOLN
INTRAMUSCULAR | Status: DC | PRN
  Administered 2021-11-08: 15 mg via INTRAVENOUS
  Administered 2021-11-08: 10 mg via INTRAVENOUS
  Administered 2021-11-08: 5 mg via INTRAVENOUS
  Administered 2021-11-08 (×2): 10 mg via INTRAVENOUS

## 2021-11-08 MED ORDER — LEVOTHYROXINE SODIUM 50 MCG PO TABS
100.0000 ug | ORAL_TABLET | Freq: Every day | ORAL | Status: DC
  Administered 2021-11-09: 100 ug via ORAL
  Filled 2021-11-08: qty 2

## 2021-11-08 MED ORDER — BUSPIRONE HCL 10 MG PO TABS
15.0000 mg | ORAL_TABLET | Freq: Three times a day (TID) | ORAL | Status: DC
  Administered 2021-11-08 – 2021-11-09 (×3): 15 mg via ORAL
  Filled 2021-11-08 (×3): qty 2

## 2021-11-08 MED ORDER — SURGIFLO WITH THROMBIN (HEMOSTATIC MATRIX KIT) OPTIME
TOPICAL | Status: DC | PRN
  Administered 2021-11-08: 1 via TOPICAL

## 2021-11-08 MED ORDER — PHENYLEPHRINE 80 MCG/ML (10ML) SYRINGE FOR IV PUSH (FOR BLOOD PRESSURE SUPPORT)
PREFILLED_SYRINGE | INTRAVENOUS | Status: AC
  Filled 2021-11-08: qty 10

## 2021-11-08 MED ORDER — SUCCINYLCHOLINE CHLORIDE 200 MG/10ML IV SOSY
PREFILLED_SYRINGE | INTRAVENOUS | Status: AC
  Filled 2021-11-08: qty 10

## 2021-11-08 MED ORDER — OXYCODONE HCL 5 MG/5ML PO SOLN: 5.0000 mg | Freq: Once | ORAL | Status: DC | PRN

## 2021-11-08 MED ORDER — ENOXAPARIN SODIUM 30 MG/0.3ML IJ SOSY
30.0000 mg | PREFILLED_SYRINGE | INTRAMUSCULAR | Status: DC
  Administered 2021-11-09: 30 mg via SUBCUTANEOUS
  Filled 2021-11-08: qty 0.3

## 2021-11-08 MED ORDER — REMIFENTANIL HCL 1 MG IV SOLR
INTRAVENOUS | Status: DC | PRN
  Administered 2021-11-08: .05 ug/kg/min via INTRAVENOUS

## 2021-11-08 MED ORDER — ORAL CARE MOUTH RINSE: 15.0000 mL | Freq: Once | OROMUCOSAL | Status: AC

## 2021-11-08 MED ORDER — GLIPIZIDE ER 10 MG PO TB24
10.0000 mg | ORAL_TABLET | Freq: Every day | ORAL | Status: DC
Start: 2021-11-08 — End: 2021-11-09
  Administered 2021-11-08 – 2021-11-09 (×2): 10 mg via ORAL
  Filled 2021-11-08 (×2): qty 1

## 2021-11-08 MED ORDER — FAMOTIDINE 20 MG PO TABS
40.0000 mg | ORAL_TABLET | Freq: Every day | ORAL | Status: DC
  Administered 2021-11-09: 40 mg via ORAL
  Filled 2021-11-08: qty 2

## 2021-11-08 MED ORDER — SODIUM CHLORIDE 0.9% FLUSH: 3.0000 mL | INTRAVENOUS | Status: DC | PRN

## 2021-11-08 MED ORDER — TRAZODONE HCL 50 MG PO TABS
50.0000 mg | ORAL_TABLET | Freq: Every evening | ORAL | Status: DC | PRN
Start: 2021-11-08 — End: 2021-11-09
  Administered 2021-11-08: 50 mg via ORAL
  Filled 2021-11-08: qty 1

## 2021-11-08 MED ORDER — SODIUM CHLORIDE 0.9% FLUSH
3.0000 mL | Freq: Two times a day (BID) | INTRAVENOUS | Status: DC
  Administered 2021-11-08 (×2): 3 mL via INTRAVENOUS

## 2021-11-08 MED ORDER — LAMOTRIGINE 25 MG PO TABS
200.0000 mg | ORAL_TABLET | Freq: Every day | ORAL | Status: DC
  Administered 2021-11-09: 200 mg via ORAL
  Filled 2021-11-08: qty 8

## 2021-11-08 MED ORDER — SENNA 8.6 MG PO TABS
1.0000 | ORAL_TABLET | Freq: Two times a day (BID) | ORAL | Status: DC
  Administered 2021-11-08 – 2021-11-09 (×3): 8.6 mg via ORAL
  Filled 2021-11-08 (×3): qty 1

## 2021-11-08 MED ORDER — CHLORHEXIDINE GLUCONATE 0.12 % MT SOLN: 15.0000 mL | Freq: Once | OROMUCOSAL | Status: AC

## 2021-11-08 MED ORDER — MIDAZOLAM HCL 2 MG/2ML IJ SOLN
INTRAMUSCULAR | Status: DC | PRN
  Administered 2021-11-08: 2 mg via INTRAVENOUS

## 2021-11-08 MED ORDER — PHENYLEPHRINE HCL-NACL 20-0.9 MG/250ML-% IV SOLN
INTRAVENOUS | Status: DC | PRN
  Administered 2021-11-08: 25 ug/min via INTRAVENOUS

## 2021-11-08 MED ORDER — REMIFENTANIL HCL 1 MG IV SOLR
INTRAVENOUS | Status: AC
  Filled 2021-11-08: qty 1000

## 2021-11-08 MED ORDER — UMECLIDINIUM BROMIDE 62.5 MCG/ACT IN AEPB
1.0000 | INHALATION_SPRAY | Freq: Every day | RESPIRATORY_TRACT | Status: DC
  Administered 2021-11-08 – 2021-11-09 (×2): 1 via RESPIRATORY_TRACT
  Filled 2021-11-08: qty 7

## 2021-11-08 MED ORDER — SODIUM CHLORIDE (PF) 0.9 % IJ SOLN
INTRAMUSCULAR | Status: DC | PRN
  Administered 2021-11-08: 60 mL via INTRAMUSCULAR

## 2021-11-08 MED ORDER — METOPROLOL SUCCINATE ER 50 MG PO TB24
50.0000 mg | ORAL_TABLET | Freq: Every day | ORAL | Status: DC
  Administered 2021-11-09: 50 mg via ORAL
  Filled 2021-11-08: qty 1

## 2021-11-08 MED ORDER — LIDOCAINE HCL (CARDIAC) PF 100 MG/5ML IV SOSY
PREFILLED_SYRINGE | INTRAVENOUS | Status: DC | PRN
  Administered 2021-11-08: 60 mg via INTRAVENOUS

## 2021-11-08 MED ORDER — BUPIVACAINE-EPINEPHRINE (PF) 0.5% -1:200000 IJ SOLN
INTRAMUSCULAR | Status: DC | PRN
  Administered 2021-11-08: 7 mL

## 2021-11-08 MED ORDER — HYDROXYZINE HCL 25 MG PO TABS
25.0000 mg | ORAL_TABLET | Freq: Two times a day (BID) | ORAL | Status: DC
  Administered 2021-11-08 – 2021-11-09 (×3): 25 mg via ORAL
  Filled 2021-11-08 (×3): qty 1

## 2021-11-08 MED ORDER — BUPIVACAINE-EPINEPHRINE (PF) 0.5% -1:200000 IJ SOLN
INTRAMUSCULAR | Status: AC
  Filled 2021-11-08: qty 30

## 2021-11-08 MED ORDER — SODIUM CHLORIDE FLUSH 0.9 % IV SOLN
INTRAVENOUS | Status: AC
  Filled 2021-11-08: qty 10

## 2021-11-08 MED ORDER — MENTHOL 3 MG MT LOZG: 1.0000 | LOZENGE | OROMUCOSAL | Status: DC | PRN

## 2021-11-08 MED ORDER — DOCUSATE SODIUM 100 MG PO CAPS
100.0000 mg | ORAL_CAPSULE | Freq: Two times a day (BID) | ORAL | Status: DC
  Administered 2021-11-08 – 2021-11-09 (×3): 100 mg via ORAL
  Filled 2021-11-08 (×3): qty 1

## 2021-11-08 MED ORDER — PROPOFOL 1000 MG/100ML IV EMUL
INTRAVENOUS | Status: AC
  Filled 2021-11-08: qty 100

## 2021-11-08 MED ORDER — CEFAZOLIN SODIUM-DEXTROSE 2-4 GM/100ML-% IV SOLN
INTRAVENOUS | Status: AC
  Filled 2021-11-08: qty 100

## 2021-11-08 MED ORDER — PHENYLEPHRINE HCL-NACL 20-0.9 MG/250ML-% IV SOLN
INTRAVENOUS | Status: AC
  Filled 2021-11-08: qty 250

## 2021-11-08 MED ORDER — METHOCARBAMOL 1000 MG/10ML IJ SOLN
500.0000 mg | Freq: Four times a day (QID) | INTRAVENOUS | Status: DC | PRN
  Filled 2021-11-08: qty 5

## 2021-11-08 MED ORDER — ONDANSETRON HCL 4 MG PO TABS: 4.0000 mg | ORAL_TABLET | Freq: Four times a day (QID) | ORAL | Status: DC | PRN

## 2021-11-08 MED ORDER — METFORMIN HCL ER 500 MG PO TB24
1000.0000 mg | ORAL_TABLET | Freq: Two times a day (BID) | ORAL | Status: DC
  Administered 2021-11-08 – 2021-11-09 (×2): 1000 mg via ORAL
  Filled 2021-11-08 (×2): qty 2

## 2021-11-08 MED ORDER — LIDOCAINE HCL (PF) 2 % IJ SOLN
INTRAMUSCULAR | Status: AC
Start: 2021-11-08 — End: ?
  Filled 2021-11-08: qty 5

## 2021-11-08 MED ORDER — METHOCARBAMOL 500 MG PO TABS
500.0000 mg | ORAL_TABLET | Freq: Four times a day (QID) | ORAL | Status: DC | PRN
  Administered 2021-11-09: 500 mg via ORAL
  Filled 2021-11-08: qty 1

## 2021-11-08 MED ORDER — CHLORHEXIDINE GLUCONATE 0.12 % MT SOLN
OROMUCOSAL | Status: AC
  Administered 2021-11-08: 15 mL via OROMUCOSAL
  Filled 2021-11-08: qty 15

## 2021-11-08 MED ORDER — VANCOMYCIN HCL IN DEXTROSE 1-5 GM/200ML-% IV SOLN
INTRAVENOUS | Status: AC
  Filled 2021-11-08: qty 200

## 2021-11-08 MED ORDER — OXYCODONE HCL 5 MG PO TABS
5.0000 mg | ORAL_TABLET | ORAL | Status: DC | PRN
  Administered 2021-11-08 (×2): 5 mg via ORAL
  Filled 2021-11-08 (×2): qty 1

## 2021-11-08 MED ORDER — DEXAMETHASONE SODIUM PHOSPHATE 10 MG/ML IJ SOLN
INTRAMUSCULAR | Status: AC
  Filled 2021-11-08: qty 1

## 2021-11-08 MED ORDER — PHENOL 1.4 % MT LIQD: 1.0000 | OROMUCOSAL | Status: DC | PRN

## 2021-11-08 MED ORDER — FENTANYL CITRATE (PF) 100 MCG/2ML IJ SOLN
INTRAMUSCULAR | Status: AC
  Filled 2021-11-08: qty 2

## 2021-11-08 MED ORDER — LORATADINE 10 MG PO TABS
10.0000 mg | ORAL_TABLET | Freq: Every day | ORAL | Status: DC
  Administered 2021-11-08 – 2021-11-09 (×2): 10 mg via ORAL
  Filled 2021-11-08 (×2): qty 1

## 2021-11-08 MED ORDER — DOXYCYCLINE HYCLATE 100 MG PO TABS
100.0000 mg | ORAL_TABLET | Freq: Every day | ORAL | Status: DC
  Administered 2021-11-08 – 2021-11-09 (×2): 100 mg via ORAL
  Filled 2021-11-08 (×2): qty 1

## 2021-11-08 SURGICAL SUPPLY — 59 items
BUR NEURO DRILL SOFT 3.0X3.8M (BURR) ×2 IMPLANT
CHLORAPREP W/TINT 26 (MISCELLANEOUS) ×4 IMPLANT
CONTROLLER INTELLIS PTM PAPER (NEUROSURGERY SUPPLIES) ×1 IMPLANT
COUNTER NEEDLE 20/40 LG (NEEDLE) ×2 IMPLANT
COVER LIGHT HANDLE STERIS (MISCELLANEOUS) ×2 IMPLANT
CUP MEDICINE 2OZ PLAST GRAD ST (MISCELLANEOUS) ×2 IMPLANT
DERMABOND ADVANCED (GAUZE/BANDAGES/DRESSINGS) ×2
DERMABOND ADVANCED .7 DNX12 (GAUZE/BANDAGES/DRESSINGS) ×2 IMPLANT
DEVICE IMPLANT NEUROSTIMULATOR (Neuro Prosthesis/Implant) ×1 IMPLANT
DRAPE C ARM PK CFD 31 SPINE (DRAPES) ×2 IMPLANT
DRAPE LAPAROTOMY 100X77 ABD (DRAPES) ×2 IMPLANT
DRAPE SURG 17X11 SM STRL (DRAPES) ×2 IMPLANT
DRSG OPSITE POSTOP 4X6 (GAUZE/BANDAGES/DRESSINGS) ×2 IMPLANT
DRSG OPSITE POSTOP 4X8 (GAUZE/BANDAGES/DRESSINGS) ×2 IMPLANT
ELECT REM PT RETURN 9FT ADLT (ELECTROSURGICAL) ×2
ELECTRODE REM PT RTRN 9FT ADLT (ELECTROSURGICAL) ×1 IMPLANT
FEE INTRAOP CADWELL SUPPLY NCS (MISCELLANEOUS) IMPLANT
FEE INTRAOP MONITOR IMPULS NCS (MISCELLANEOUS) IMPLANT
GAUZE 4X4 16PLY ~~LOC~~+RFID DBL (SPONGE) ×1 IMPLANT
GLOVE BIOGEL PI IND STRL 6.5 (GLOVE) ×1 IMPLANT
GLOVE BIOGEL PI INDICATOR 6.5 (GLOVE) ×1
GLOVE SURG SYN 6.5 ES PF (GLOVE) ×2 IMPLANT
GLOVE SURG SYN 6.5 PF PI (GLOVE) ×1 IMPLANT
GLOVE SURG SYN 8.5  E (GLOVE) ×6
GLOVE SURG SYN 8.5 E (GLOVE) ×3 IMPLANT
GLOVE SURG SYN 8.5 PF PI (GLOVE) ×3 IMPLANT
GOWN SRG LRG LVL 4 IMPRV REINF (GOWNS) ×1 IMPLANT
GOWN SRG XL LVL 3 NONREINFORCE (GOWNS) ×1 IMPLANT
GOWN STRL NON-REIN TWL XL LVL3 (GOWNS) ×2
GOWN STRL REIN LRG LVL4 (GOWNS) ×2
GRADUATE 1200CC STRL 31836 (MISCELLANEOUS) ×2 IMPLANT
GRAFT DURAGEN MATRIX 1WX1L (Tissue) IMPLANT
INTRAOP CADWELL SUPPLY FEE NCS (MISCELLANEOUS) ×1
INTRAOP DISP SUPPLY FEE NCS (MISCELLANEOUS) ×2
INTRAOP MONITOR FEE IMPULS NCS (MISCELLANEOUS) ×1
INTRAOP MONITOR FEE IMPULSE (MISCELLANEOUS) ×2
KIT SPINAL PRONEVIEW (KITS) ×2 IMPLANT
KIT TURNOVER KIT A (KITS) ×2 IMPLANT
MANIFOLD NEPTUNE II (INSTRUMENTS) ×2 IMPLANT
MARKER SKIN DUAL TIP RULER LAB (MISCELLANEOUS) ×2 IMPLANT
NDL SAFETY ECLIPSE 18X1.5 (NEEDLE) ×1 IMPLANT
NEEDLE HYPO 18GX1.5 SHARP (NEEDLE) ×2
NS IRRIG 500ML POUR BTL (IV SOLUTION) ×1 IMPLANT
PACK LAMINECTOMY NEURO (CUSTOM PROCEDURE TRAY) ×2 IMPLANT
PENCIL ELECTRO HAND CTR (MISCELLANEOUS) ×1 IMPLANT
RECHARGER INTELLIS (NEUROSURGERY SUPPLIES) ×1 IMPLANT
SOLUTION IRRIG SURGIPHOR (IV SOLUTION) ×2 IMPLANT
STAPLER SKIN PROX 35W (STAPLE) ×2 IMPLANT
STIMULATOR CORD SURESCAN MRI (Stimulator) ×1 IMPLANT
SURGIFLO W/THROMBIN 8M KIT (HEMOSTASIS) ×2 IMPLANT
SUT DVC VLOC 3-0 CL 6 P-12 (SUTURE) ×3 IMPLANT
SUT SILK 2 0SH CR/8 30 (SUTURE) ×2 IMPLANT
SUT VIC AB 0 CT1 18XCR BRD 8 (SUTURE) ×1 IMPLANT
SUT VIC AB 0 CT1 8-18 (SUTURE) ×4
SUT VIC AB 2-0 CT1 18 (SUTURE) ×4 IMPLANT
SYR 10ML LL (SYRINGE) ×2 IMPLANT
SYR 30ML LL (SYRINGE) ×4 IMPLANT
TOWEL OR 17X26 4PK STRL BLUE (TOWEL DISPOSABLE) ×6 IMPLANT
TUBING CONNECTING 10 (TUBING) ×2 IMPLANT

## 2021-11-08 NOTE — Op Note (Addendum)
Indications: the patient is a 66 yo female who was diagnosed with G89.4 chronic pain syndrome. The patient had a successful trial for spinal cord stimulation, so was consented for placement of a permanent device   Findings: successful placement of a Medtronic spinal cord stimulator.   Preoperative Diagnosis: G89.4 chronic pain syndrome Postoperative Diagnosis: same     EBL: 25 ml IVF: 1000 ml Drains: none Disposition: Extubated and Stable to PACU Complications: none   No foley catheter was placed.     Preoperative Note:    Risks of surgery discussed in clinic.   Operative Note:      The patient was then brought from the preoperative center with intravenous access established.  The patient underwent general anesthesia and endotracheal tube intubation, then was rotated on the [facility] table where all pressure points were appropriately padded.  An incision was marked with flouroscopy at T8/9, and on the right flank. The skin was then thoroughly cleansed.  Perioperative antibiotic prophylaxis was administered.  Sterile prep and drapes were then applied and a timeout was then observed.     Once this was complete an incision was opened with the use of a #10 blade knife in the midline at the thoracic incision.  The paraspinus muscled were subperiosteally dissected until the laminae of T8 and T9 were visualized. Flouroscopy was used to confirm the level. A self-retaining retractor was placed.   The rongeur was used to remove the spinous process of T9.  The drill was used to thin the bone until the ligamentum flavum was visualized.  The ligamentum was then removed and the dura visualized. This was widened until placement of the paddle lead was possible.     The lead was then advanced to the top of T7 body at the top of the lead.  The lead was secured with a 2-0 silk suture.     The incision on the flank was then opened and a pocket formed until it was large enough for the pulse generator.  The  tunneler was used to connect between the pocket and the incision.  The lead was inserted into the tunneler and tunneled to the buttock.  The leads were attached to the IPG and impedances checked.  The leads were then tightened.  The IPG was then inserted into the pouch.   Both sites were irrigated.  The wounds were closed in layers with 0 and 2-0 vicryl.  The skin was approximated with monocryl. A sterile dressing was applied.   Monitoring was stable throughout.   Patient was then rotated back to the preoperative bed awakened from anesthesia and taken to recovery all counts are correct in this case.   I performed the entire procedure with the assistance of Cooper Render PA as an Pensions consultant.An assistant was required for this procedure due to the complexity.  The assistant provided assistance in tissue manipulation and suction, and was required for the successful and safe performance of the procedure. I performed the critical portions of the procedure.      Meade Maw MD

## 2021-11-08 NOTE — H&P (Signed)
  I have reviewed and confirmed my history and physical from 10/12/2021 with no additions or changes. Plan for spinal cord stimulator placement.  Risks and benefits reviewed.  Heart sounds normal no MRG. Chest Clear to Auscultation Bilaterally.

## 2021-11-08 NOTE — Anesthesia Procedure Notes (Addendum)
Procedure Name: Intubation Date/Time: 11/08/2021 7:24 AM  Performed by: Aline Brochure, CRNAPre-anesthesia Checklist: Patient identified, Patient being monitored, Timeout performed, Emergency Drugs available and Suction available Patient Re-evaluated:Patient Re-evaluated prior to induction Oxygen Delivery Method: Circle system utilized Preoxygenation: Pre-oxygenation with 100% oxygen Induction Type: IV induction Ventilation: Mask ventilation without difficulty Laryngoscope Size: 3 and McGraph Grade View: Grade I Tube type: Oral Tube size: 7.0 mm Number of attempts: 1 Airway Equipment and Method: Stylet, Video-laryngoscopy and Bite block Placement Confirmation: ETT inserted through vocal cords under direct vision, positive ETCO2 and breath sounds checked- equal and bilateral Secured at: 21 cm Tube secured with: Tape Dental Injury: Teeth and Oropharynx as per pre-operative assessment

## 2021-11-08 NOTE — Discharge Instructions (Addendum)
NEUROSURGERY DISCHARGE INSTRUCTIONS  Admission diagnosis: Chronic pain [G89.29]  Operative procedure: Spinal cord stimulator placement  What to do after you leave the hospital:  Recommended diet: diabetic diet. Increase protein intake to promote wound healing.  Recommended activity: no lifting, driving, or strenuous exercise for 4 weeks . You should walk multiple times per day  Special Instructions  No straining, no heavy lifting > 10lbs x 4 weeks.  Keep incision area clean and dry. May shower in 2 days. No baths or pools for 6 weeks.  Please remove dressing tomorrow, no need to apply a bandage afterwards  You have no sutures to remove, the skin is closed with adhesive  Please take pain medications as directed. Take a stool softener if on pain medications   Please Report any of the following: Nausea or Vomiting, Temperature is greater than 101.75F (38.1C) degrees, Dizziness, Abdominal Pain, Difficulty Breathing or Shortness of Breath, Inability to Eat, drink Fluids, or Take medications, Bleeding, swelling, or drainage from surgical incision sites, New numbness or weakness, and Bowel or bladder dysfunction to the neurosurgeon on call at [phone]  Additional Follow up appointments Please follow up with Cooper Render PA-C as scheduled in 2-3 weeks   Please see below for scheduled appointments:  Future Appointments  Date Time Provider [place]  11/14/2021  2:00 PM [facility] MM DEXA [facility]  11/20/2021  9:40 AM Juline Patch, MD [facility]  11/21/2021  3:00 PM Loleta Dicker, PA AS-AS None  12/21/2021 11:30 AM Meade Maw, MD AS-AS None  07/23/2022  3:00 PM [facility] ADVISOR [facility]

## 2021-11-08 NOTE — Anesthesia Preprocedure Evaluation (Signed)
Anesthesia Evaluation  Patient identified by MRN, date of birth, ID band Patient awake    Reviewed: Allergy & Precautions, NPO status , Patient's Chart, lab work & pertinent test results  History of Anesthesia Complications Negative for: history of anesthetic complications  Airway Mallampati: III  TM Distance: <3 FB Neck ROM: full    Dental  (+) Chipped, Poor Dentition, Missing   Pulmonary shortness of breath and with exertion, pneumonia, COPD, Current Smoker,    Pulmonary exam normal        Cardiovascular hypertension, Normal cardiovascular exam     Neuro/Psych PSYCHIATRIC DISORDERS  Neuromuscular disease CVA    GI/Hepatic Neg liver ROS, PUD, GERD  Controlled,  Endo/Other  diabetesHypothyroidism   Renal/GU Renal disease     Musculoskeletal   Abdominal   Peds  Hematology negative hematology ROS (+)   Anesthesia Other Findings Past Medical History: No date: Anemia No date: Arthritis No date: Bipolar 1 disorder (HCC) No date: Cancer ([place])     Comment:  cervical No date: Chronic kidney disease No date: COPD (chronic obstructive pulmonary disease) (HCC) No date: Diabetes mellitus without complication (HCC) No date: Dyspnea No date: GERD (gastroesophageal reflux disease) No date: History of kidney stones 1988: Hx of right breast biopsy     Comment:  RIGHT excisional bx 1988 No date: Hyperlipidemia No date: Hypertension No date: Hypothyroidism No date: Pneumonia No date: PTSD (post-traumatic stress disorder) No date: Schizophrenia ([place]) No date: Spinal stenosis No date: Stroke [facility])     Comment:  mild 2013 No date: Thyroid disease  Past Surgical History: No date: ABDOMINAL HYSTERECTOMY No date: BACK SURGERY No date: BREAST BIOPSY; Right     Comment:  negative 1988 No date: CHOLECYSTECTOMY 08/08/2021: ESOPHAGOGASTRODUODENOSCOPY (EGD) WITH PROPOFOL; N/A     Comment:  Procedure:  ESOPHAGOGASTRODUODENOSCOPY (EGD) WITH               PROPOFOL;  Surgeon: Lucilla Lame, MD;  Location: [facility]               ENDOSCOPY;  Service: Endoscopy;  Laterality: N/A; No date: TUBAL LIGATION     Reproductive/Obstetrics negative OB ROS                             Anesthesia Physical Anesthesia Plan  ASA: 3  Anesthesia Plan: General ETT   Post-op Pain Management:    Induction: Intravenous  PONV Risk Score and Plan: Ondansetron, Dexamethasone, Midazolam and Treatment may vary due to age or medical condition  Airway Management Planned: Oral ETT  Additional Equipment:   Intra-op Plan:   Post-operative Plan: Extubation in OR  Informed Consent: I have reviewed the patients History and Physical, chart, labs and discussed the procedure including the risks, benefits and alternatives for the proposed anesthesia with the patient or authorized representative who has indicated his/her understanding and acceptance.     Dental Advisory Given  Plan Discussed with: Anesthesiologist, CRNA and Surgeon  Anesthesia Plan Comments: (Patient consented for risks of anesthesia including but not limited to:  - adverse reactions to medications - damage to eyes, teeth, lips or other oral mucosa - nerve damage due to positioning  - sore throat or hoarseness - Damage to heart, brain, nerves, lungs, other parts of body or loss of life  Patient voiced understanding.)        Anesthesia Quick Evaluation

## 2021-11-08 NOTE — Anesthesia Postprocedure Evaluation (Signed)
Anesthesia Post Note  Patient: Shona Pardo  Procedure(s) Performed: THORACIC LAMINECTOMY FOR SPINAL CORD STIMULATOR IMPLANT (MEDTRONIC) (Thoracic)  Patient location during evaluation: PACU Anesthesia Type: General Level of consciousness: awake and alert Pain management: pain level controlled Vital Signs Assessment: post-procedure vital signs reviewed and stable Respiratory status: spontaneous breathing, nonlabored ventilation, respiratory function stable and patient connected to nasal cannula oxygen Cardiovascular status: blood pressure returned to baseline and stable Postop Assessment: no apparent nausea or vomiting Anesthetic complications: no   No notable events documented.   Last Vitals:  Vitals:   11/08/21 1036 11/08/21 1230  BP: 132/73 (!) 159/77  Pulse: 78 84  Resp: 20 18  Temp: 36.6 C 36.8 C  SpO2: 93% 93%    Last Pain:  Vitals:   11/08/21 1230  TempSrc: Oral  PainSc:                  Precious Haws Fayez Sturgell

## 2021-11-08 NOTE — Plan of Care (Signed)

## 2021-11-08 NOTE — Transfer of Care (Signed)
Immediate Anesthesia Transfer of Care Note  Patient: Mary Nicholson  Procedure(s) Performed: THORACIC LAMINECTOMY FOR SPINAL CORD STIMULATOR IMPLANT (MEDTRONIC) (Thoracic)  Patient Location: PACU  Anesthesia Type:General  Level of Consciousness: drowsy  Airway & Oxygen Therapy: Patient Spontanous Breathing and Patient connected to face mask oxygen  Post-op Assessment: Report given to RN and Post -op Vital signs reviewed and stable  Post vital signs: Reviewed and stable  Last Vitals:  Vitals Value Taken Time  BP 130/76 11/08/21 0915  Temp    Pulse 65   Resp 18 11/08/21 0919  SpO2 98   Vitals shown include unvalidated device data.  Last Pain:  Vitals:   11/08/21 0711  TempSrc: Temporal  PainSc: 0-No pain         Complications: No notable events documented.

## 2021-11-08 NOTE — TOC Initial Note (Addendum)
Transition of Care [facility]) - Initial/Assessment Note    Patient Details  Name: Mary Nicholson MRN: [id number] Date of Birth: 1955/06/11  [facility]) CM/SW Contact:    Conception Oms, RN Phone Number: 11/08/2021, 10:53 AM  Clinical Narrative:                   Transition of Care [facility]) Screening Note   Patient Details  Name: Mary Nicholson Date of Birth: 07-28-1955   Transition of Care [facility]) CM/SW Contact:    Conception Oms, RN Phone Number: 11/08/2021, 10:53 AM   [facility]P [facility]    Transition of Care Department [facility]) has reviewed patient and no TOC needs have been identified at this time. We will continue to monitor patient advancement through interdisciplinary progression rounds. If new patient transition needs arise, please place a TOC consult.         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   Chronic radicular lumbar pain 05/04/2021   History of lumbar fusion 05/04/2021   Chronic pain syndrome 05/04/2021   Bipolar affective disorder, mixed ([place]) 11/26/2019   COPD (chronic obstructive pulmonary disease) ([place]) 11/26/2019   PTSD (post-traumatic stress disorder) 11/26/2019   Schizophrenia ([place]) 11/26/2019   Abdominal wall cellulitis 11/12/2019   Abdominal wall abscess    Type 2 diabetes mellitus with diabetic neuropathy, without long-term current use of insulin (HCC)    Hyperlipidemia     Bilateral hand numbness 09/16/2019   S/P hysterectomy 09/09/2019   Stage 3b chronic kidney disease ([place]) 09/09/2019   Cervical radiculitis 08/13/2019   Chronic bilateral low back pain with right-sided sciatica 08/13/2019   Lumbar radiculopathy 08/13/2019   Neck pain 08/13/2019   Numbness and tingling of both feet 07/30/2019   Dupuytren contracture 05/22/2019   Personal history of transient ischemic attack (TIA), and cerebral infarction without residual deficits 05/22/2019   Resting tremor 05/22/2019   Diverticulosis 05/20/2019   External hemorrhoid 05/20/2019   Abnormal cervical Papanicolaou smear 12/19/2018   Arthralgia of both hands 12/19/2018   Breast pain, right 12/19/2018   Diarrhea 12/19/2018   Dysphagia 12/19/2018   Paresthesias 12/19/2018   Seasonal allergies 12/19/2018   Gastroesophageal reflux disease 09/23/2015   Spinal stenosis of lumbar region 05/04/2015   Allergic rhinitis 11/28/2014   Acquired hypothyroidism 09/13/2014   Essential hypertension 09/13/2014   Type 2 diabetes mellitus with diabetic neuropathy ([place]) 09/13/2014   PCP:  Juline Patch, MD Pharmacy:   [id number] [place],  [place] - [address] [place] [address] Phone: [phone] Fax: [phone]     Social Determinants of Health (SDOH) Interventions    Readmission Risk Interventions     No data to display

## 2021-11-09 DIAGNOSIS — G894 Chronic pain syndrome: Secondary | ICD-10-CM | POA: Diagnosis not present

## 2021-11-09 DIAGNOSIS — R7309 Other abnormal glucose: Secondary | ICD-10-CM | POA: Diagnosis not present

## 2021-11-09 MED ORDER — SENNA 8.6 MG PO TABS: 1.0000 | ORAL_TABLET | Freq: Every day | ORAL | 0 refills | Status: DC | PRN

## 2021-11-09 MED ORDER — OXYCODONE HCL 5 MG PO TABS: 5.0000 mg | ORAL_TABLET | ORAL | 0 refills | Status: AC | PRN

## 2021-11-09 MED ORDER — METHOCARBAMOL 500 MG PO TABS: 500.0000 mg | ORAL_TABLET | Freq: Three times a day (TID) | ORAL | 0 refills | Status: AC | PRN

## 2021-11-09 NOTE — Progress Notes (Signed)
    Attending Progress Note  History: Mary Nicholson is s/p placement of SCS  POD1 some abdominal numbness overnight and expected soreness.  Physical Exam: Vitals:   11/08/21 2120 11/09/21 0324  BP: 127/60 (!) 157/68  Pulse: 76 67  Resp: 18 18  Temp: 98 F (36.7 C) 98 F (36.7 C)  SpO2: 94% 95%    AA Ox3 CNI MAEW Incisions covered with post-op dressings  Data:  Recent Labs  Lab 11/08/21 0635  K 4.2   No results for input(s): "AST", "ALT", "ALKPHOS" in the last 168 hours.  Invalid input(s): "TBILI"   No results for input(s): "WBC", "HGB", "HCT", "PLT" in the last 168 hours. No results for input(s): "APTT", "INR" in the last 168 hours.       Other tests/results: none   Assessment/Plan:  Mary Nicholson is a 66 y.o with a history of chronic pain s/p SCS placement   - mobilize - pain control - DVT prophylaxis - plan to d/c home this morning  Cooper Render PA-C Department of Neurosurgery

## 2021-11-09 NOTE — Discharge Summary (Signed)
Physician Discharge Summary  Patient ID: Mary Nicholson MRN: [id number] DOB/AGE: 1955/11/11 66 y.o.  Admit date: 11/08/2021 Discharge date: 11/09/2021  Admission Diagnoses: G89.4 chronic pain syndrome  Discharge Diagnoses:  Principal Problem:   Chronic pain   Discharged Condition: good  Hospital Course:  Mary Nicholson is a 66 y.o with a history of chronic pain s/p SCS placement. Her intraoperative course was uncomplicated. She was admitted overnight for pain control. She reported pain was well controlled on PRN medications and was able to be discharged home on POD1 with PRN medications for pain.  Consults: None  Significant Diagnostic Studies: none  Treatments: surgery: as above. Please see separately dictated operative report for further details  Discharge Exam: Blood pressure (!) 157/68, pulse 67, temperature 98 F (36.7 C), resp. rate 18, height '5\' 1"'$  (1.549 m), weight 60.8 kg, SpO2 95 %. AA Ox3 pt sitting at EOB CNI MAEW Incisions covered with post-op dressings  Disposition: Discharge disposition: 01-Home or Self Care       Discharge Instructions     Incentive spirometry RT   Complete by: As directed    Remove dressing in 24 hours   Complete by: As directed       Allergies as of 11/09/2021   No Known Allergies      Medication List     TAKE these medications    albuterol 108 (90 Base) MCG/ACT inhaler Commonly known as: VENTOLIN HFA INHALE 1-2 PUFFS BY MOUTH EVERY 6 HOURS AS NEEDED FOR WHEEZE OR SHORTNESS OF BREATH   Allergy Relief 10 MG Caps Generic drug: Cetirizine HCl Take 1 capsule by mouth daily.   amLODipine 5 MG tablet Commonly known as: NORVASC TAKE 1 TABLET (5 MG TOTAL) BY MOUTH DAILY.   aspirin 81 MG chewable tablet Chew by mouth.   atorvastatin 80 MG tablet Commonly known as: LIPITOR TAKE 1 TABLET BY MOUTH EVERY DAY   b complex vitamins capsule Take 1 capsule by mouth daily.   Biotin 5 MG Caps Take 1 capsule  by mouth daily.   busPIRone 15 MG tablet Commonly known as: BUSPAR Take 15 mg by mouth 3 (three) times daily.   carboxymethylcellulose 0.5 % Soln Commonly known as: REFRESH PLUS Place 1 drop into both eyes daily. 1 drop in left eye BiD and 1 drop to right eye daily   CENTRUM SILVER PO Take 1 each by mouth daily.   doxycycline 100 MG tablet Commonly known as: VIBRA-TABS Take 1 tablet (100 mg total) by mouth daily.   famotidine 40 MG tablet Commonly known as: PEPCID TAKE 1 TABLET BY MOUTH EVERY DAY   fluticasone 50 MCG/ACT nasal spray Commonly known as: FLONASE SPRAY 2 SPRAYS INTO EACH NOSTRIL EVERY DAY   glipiZIDE 10 MG 24 hr tablet Commonly known as: GLUCOTROL XL TAKE 1 TABLET BY MOUTH EVERY DAY   hydrochlorothiazide 25 MG tablet Commonly known as: HYDRODIURIL Take 1 tablet (25 mg total) by mouth daily.   hydrOXYzine 25 MG tablet Commonly known as: ATARAX Take 25 mg by mouth 2 (two) times daily.   lamoTRIgine 200 MG tablet Commonly known as: LAMICTAL Take 200 mg by mouth daily. cbc   levothyroxine 100 MCG tablet Commonly known as: SYNTHROID TAKE 1 TABLET BY MOUTH EVERY DAY   metFORMIN 500 MG 24 hr tablet Commonly known as: GLUCOPHAGE-XR Take 2 tablets (1,000 mg total) by mouth 2 (two) times daily.   methocarbamol 500 MG tablet Commonly known as: ROBAXIN Take 1 tablet (500 mg total)  by mouth every 8 (eight) hours as needed for muscle spasms.   metoprolol succinate 50 MG 24 hr tablet Commonly known as: TOPROL-XL Take 1 tablet (50 mg total) by mouth daily.   mupirocin ointment 2 % Commonly known as: BACTROBAN Apply 1 application. topically 2 (two) times daily.   OneTouch Delica Lancets 88Q Misc USE 1 EACH ONCE DAILY USE AS INSTRUCTED.   OneTouch Ultra test strip Generic drug: glucose blood 3 (three) times daily.   oxyCODONE 5 MG immediate release tablet Commonly known as: Oxy IR/ROXICODONE Take 1 tablet (5 mg total) by mouth every 4 (four) hours as  needed for up to 5 days for moderate pain ((score 4 to 6)).   senna 8.6 MG Tabs tablet Commonly known as: SENOKOT Take 1 tablet (8.6 mg total) by mouth daily as needed for mild constipation.   traZODone 50 MG tablet Commonly known as: DESYREL Take 50 mg by mouth at bedtime as needed.   Trelegy Ellipta 100-62.5-25 MCG/ACT Aepb Generic drug: Fluticasone-Umeclidin-Vilant TAKE 1 PUFF BY MOUTH EVERY DAY   TYLENOL ARTHRITIS EXT RELIEF PO Take 1 capsule by mouth 3 (three) times daily.   venlafaxine XR 75 MG 24 hr capsule Commonly known as: EFFEXOR-XR Take 150 mg by mouth in the morning and at bedtime.        Follow-up Information     Loleta Dicker, PA Follow up in 2 week(s).   Specialty: Neurosurgery Why: for incision check. Please bring stimulator accessories to this appointment as the rep will be there to make adjustments Contact information: [address] [place] [address] [phone]                 Signed: Loleta Dicker 11/09/2021, 8:15 AM

## 2021-11-09 NOTE — Plan of Care (Signed)

## 2021-11-14 ENCOUNTER — Ambulatory Visit
Admission: RE | Admit: 2021-11-14 | Discharge: 2021-11-14 | Disposition: A | Discharge status: Home / Self Care | Payer: Medicare Other | Source: Ambulatory Visit | Attending: Family Medicine | Admitting: Family Medicine

## 2021-11-14 DIAGNOSIS — Z1231 Encounter for screening mammogram for malignant neoplasm of breast: Secondary | ICD-10-CM | POA: Diagnosis not present

## 2021-11-20 ENCOUNTER — Encounter: Payer: Self-pay | Admitting: Family Medicine

## 2021-11-20 ENCOUNTER — Ambulatory Visit: Payer: Medicare Other | Admitting: Family Medicine

## 2021-11-20 ENCOUNTER — Other Ambulatory Visit: Payer: Self-pay | Admitting: Family Medicine

## 2021-11-20 ENCOUNTER — Ambulatory Visit (INDEPENDENT_AMBULATORY_CARE_PROVIDER_SITE_OTHER): Payer: Medicare Other | Admitting: Family Medicine

## 2021-11-20 VITALS — BP 146/84 | HR 68 | Ht 61.0 in | Wt 135.0 lb

## 2021-11-20 DIAGNOSIS — E039 Hypothyroidism, unspecified: Secondary | ICD-10-CM

## 2021-11-20 DIAGNOSIS — R32 Unspecified urinary incontinence: Secondary | ICD-10-CM

## 2021-11-20 DIAGNOSIS — E1140 Type 2 diabetes mellitus with diabetic neuropathy, unspecified: Secondary | ICD-10-CM

## 2021-11-20 DIAGNOSIS — R1319 Other dysphagia: Secondary | ICD-10-CM

## 2021-11-20 DIAGNOSIS — I10 Essential (primary) hypertension: Secondary | ICD-10-CM

## 2021-11-20 DIAGNOSIS — E7801 Familial hypercholesterolemia: Secondary | ICD-10-CM

## 2021-11-20 DIAGNOSIS — J301 Allergic rhinitis due to pollen: Secondary | ICD-10-CM | POA: Diagnosis not present

## 2021-11-20 DIAGNOSIS — F17210 Nicotine dependence, cigarettes, uncomplicated: Secondary | ICD-10-CM

## 2021-11-20 DIAGNOSIS — K219 Gastro-esophageal reflux disease without esophagitis: Secondary | ICD-10-CM

## 2021-11-20 MED ORDER — METOPROLOL SUCCINATE ER 50 MG PO TB24: 50.0000 mg | ORAL_TABLET | Freq: Every day | ORAL | 1 refills | Status: DC

## 2021-11-20 MED ORDER — GLIPIZIDE ER 10 MG PO TB24: 10.0000 mg | ORAL_TABLET | Freq: Every day | ORAL | 0 refills | Status: DC

## 2021-11-20 MED ORDER — HYDROCHLOROTHIAZIDE 25 MG PO TABS: 25.0000 mg | ORAL_TABLET | Freq: Every day | ORAL | 0 refills | Status: DC

## 2021-11-20 MED ORDER — AMLODIPINE BESYLATE 5 MG PO TABS: 5.0000 mg | ORAL_TABLET | Freq: Every day | ORAL | 0 refills | Status: DC

## 2021-11-20 MED ORDER — METFORMIN HCL ER 500 MG PO TB24: 1000.0000 mg | ORAL_TABLET | Freq: Two times a day (BID) | ORAL | 0 refills | Status: DC

## 2021-11-20 MED ORDER — FAMOTIDINE 40 MG PO TABS: 40.0000 mg | ORAL_TABLET | Freq: Every day | ORAL | 0 refills | Status: DC

## 2021-11-20 MED ORDER — LEVOTHYROXINE SODIUM 100 MCG PO TABS: 100.0000 ug | ORAL_TABLET | Freq: Every day | ORAL | 0 refills | Status: DC

## 2021-11-20 MED ORDER — ATORVASTATIN CALCIUM 80 MG PO TABS: 80.0000 mg | ORAL_TABLET | Freq: Every day | ORAL | 0 refills | Status: DC

## 2021-11-20 MED ORDER — FLUTICASONE PROPIONATE 50 MCG/ACT NA SUSP
NASAL | 0 refills | Status: DC
Start: 2021-11-20 — End: 2021-12-27

## 2021-11-20 NOTE — Progress Notes (Signed)
Date:  11/20/2021   Name:  Mary Nicholson   DOB:  10-18-1955   MRN:  [id number]   Chief Complaint: Hypertension, Hyperlipidemia, Gastroesophageal Reflux, Diabetes, Hypothyroidism, and Urinary Incontinence  Hypertension This is a chronic problem. The current episode started more than 1 year ago. The problem is controlled. Pertinent negatives include no anxiety, blurred vision, chest pain, headaches, malaise/fatigue, neck pain, orthopnea, palpitations, peripheral edema, PND, shortness of breath or sweats. There are no associated agents to hypertension. Past treatments include calcium channel blockers, beta blockers and diuretics. The current treatment provides moderate improvement. There are no compliance problems.  There is no history of angina, kidney disease, CAD/MI, CVA, heart failure, left ventricular hypertrophy, PVD or retinopathy. There is no history of chronic renal disease, hyperaldosteronism or a hypertension causing med.  Hyperlipidemia This is a chronic problem. The current episode started more than 1 year ago. The problem is controlled. Recent lipid tests were reviewed and are normal. She has no history of chronic renal disease. Pertinent negatives include no chest pain or shortness of breath. Current antihyperlipidemic treatment includes statins. The current treatment provides moderate improvement of lipids. There are no compliance problems.   Gastroesophageal Reflux She reports no chest pain, no coughing, no dysphagia, no heartburn or no nausea. This is a chronic problem. The current episode started yesterday. The problem has been gradually improving. Pertinent negatives include no fatigue or weight loss. She has tried a PPI for the symptoms.  Diabetes She has type 2 diabetes mellitus. Her disease course has been stable. Pertinent negatives for hypoglycemia include no headaches or sweats. Pertinent negatives for diabetes include no blurred vision, no chest pain, no fatigue, no  polydipsia, no polyphagia, no polyuria and no weight loss. Symptoms are improving. Pertinent negatives for diabetic complications include no CVA, PVD or retinopathy. She is following a generally healthy diet. Meal planning includes avoidance of concentrated sweets and carbohydrate counting. Her breakfast blood glucose is taken between 8-9 am. Her breakfast blood glucose range is generally 90-110 mg/dl.    Lab Results  Component Value Date   NA 138 10/31/2021   K 4.2 11/08/2021   CO2 26 10/31/2021   GLUCOSE 124 (H) 10/31/2021   BUN 13 10/31/2021   CREATININE 0.77 10/31/2021   CALCIUM 9.7 10/31/2021   EGFR 97 05/22/2021   GFRNONAA >60 10/31/2021   Lab Results  Component Value Date   CHOL 144 05/22/2021   HDL 45 05/22/2021   LDLCALC 62 05/22/2021   TRIG 231 (H) 05/22/2021   Lab Results  Component Value Date   TSH 3.790 05/22/2021   Lab Results  Component Value Date   HGBA1C 7.3 (H) 05/31/2021   Lab Results  Component Value Date   WBC 7.4 10/31/2021   HGB 14.0 10/31/2021   HCT 42.8 10/31/2021   MCV 91.1 10/31/2021   PLT 359 10/31/2021   Lab Results  Component Value Date   ALT 21 07/22/2020   AST 16 07/22/2020   ALKPHOS 103 07/22/2020   BILITOT 0.3 07/22/2020   No results found for: "25OHVITD2", "25OHVITD3", "VD25OH"   Review of Systems  Constitutional:  Negative for fatigue, malaise/fatigue and weight loss.  HENT:  Negative for congestion.   Eyes:  Negative for blurred vision.  Respiratory:  Negative for cough, chest tightness and shortness of breath.   Cardiovascular:  Negative for chest pain, palpitations, orthopnea, leg swelling and PND.  Gastrointestinal:  Negative for dysphagia, heartburn and nausea.  Endocrine: Negative for polydipsia, polyphagia  and polyuria.  Genitourinary:  Negative for difficulty urinating and flank pain.       Incontinence  Musculoskeletal:  Negative for neck pain.  Neurological:  Negative for headaches.    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   Chronic radicular lumbar pain 05/04/2021   History of lumbar fusion 05/04/2021   Chronic pain syndrome 05/04/2021   Bipolar affective disorder, mixed ([place]) 11/26/2019   COPD (chronic obstructive pulmonary disease) ([place]) 11/26/2019   PTSD (post-traumatic stress disorder) 11/26/2019   Schizophrenia ([place]) 11/26/2019   Abdominal wall cellulitis 11/12/2019   Abdominal wall abscess    Type 2 diabetes mellitus with diabetic neuropathy, without long-term current use of insulin (HCC)    Hyperlipidemia    Bilateral hand numbness 09/16/2019   S/P hysterectomy 09/09/2019   Stage 3b chronic kidney disease ([place]) 09/09/2019   Cervical radiculitis 08/13/2019   Chronic bilateral low back pain with right-sided sciatica 08/13/2019   Lumbar radiculopathy 08/13/2019   Neck pain 08/13/2019   Numbness and tingling of both feet 07/30/2019   Dupuytren contracture 05/22/2019   Personal history of transient ischemic attack (TIA), and cerebral infarction without residual deficits 05/22/2019   Resting tremor 05/22/2019   Diverticulosis 05/20/2019   External hemorrhoid 05/20/2019   Abnormal cervical Papanicolaou smear 12/19/2018   Arthralgia of both hands 12/19/2018   Breast pain, right 12/19/2018   Diarrhea 12/19/2018   Dysphagia 12/19/2018   Paresthesias 12/19/2018   Seasonal allergies 12/19/2018   Gastroesophageal reflux disease 09/23/2015   Spinal stenosis of lumbar region 05/04/2015   Allergic rhinitis 11/28/2014   Acquired hypothyroidism 09/13/2014   Essential hypertension 09/13/2014   Type 2 diabetes mellitus with diabetic neuropathy ([place]) 09/13/2014    No Known Allergies  Past Surgical History:  Procedure Laterality Date   ABDOMINAL HYSTERECTOMY     BACK SURGERY     BREAST BIOPSY Right    negative 1988   BREAST  EXCISIONAL BIOPSY Right 1988   CHOLECYSTECTOMY     ESOPHAGOGASTRODUODENOSCOPY (EGD) WITH PROPOFOL N/A 08/08/2021   Procedure: ESOPHAGOGASTRODUODENOSCOPY (EGD) WITH PROPOFOL;  Surgeon: Lucilla Lame, MD;  Location: [facility] ENDOSCOPY;  Service: Endoscopy;  Laterality: N/A;   THORACIC LAMINECTOMY FOR SPINAL CORD STIMULATOR N/A 11/08/2021   Procedure: THORACIC LAMINECTOMY FOR SPINAL CORD STIMULATOR IMPLANT (MEDTRONIC);  Surgeon: Meade Maw, MD;  Location: [facility] ORS;  Service: Neurosurgery;  Laterality: N/A;   TUBAL LIGATION      Social History   Tobacco Use   Smoking status: Every Day    Packs/day: 1.00    Years: 57.00    Total pack years: 57.00    Types: Cigarettes   Smokeless tobacco: Never   Tobacco comments:    Has cut back to 1/2 PPD  Vaping Use   Vaping Use: Never used  Substance Use Topics   Alcohol use: Yes    Comment: Occasional   Drug use: Never     Medication list has been reviewed and updated.  Current Meds  Medication Sig   Acetaminophen (TYLENOL ARTHRITIS EXT RELIEF PO) Take 1 capsule by mouth 3 (three) times daily.   albuterol (VENTOLIN HFA) 108 (90 Base) MCG/ACT inhaler INHALE 1-2 PUFFS BY MOUTH EVERY 6 HOURS AS NEEDED FOR WHEEZE OR SHORTNESS OF BREATH   amLODipine (NORVASC) 5 MG tablet TAKE 1 TABLET (5 MG TOTAL) BY  MOUTH DAILY.   aspirin 81 MG chewable tablet Chew by mouth.   atorvastatin (LIPITOR) 80 MG tablet TAKE 1 TABLET BY MOUTH EVERY DAY   b complex vitamins capsule Take 1 capsule by mouth daily.   Biotin 5 MG CAPS Take 1 capsule by mouth daily.   busPIRone (BUSPAR) 15 MG tablet Take 15 mg by mouth 3 (three) times daily.   carboxymethylcellulose (REFRESH PLUS) 0.5 % SOLN Place 1 drop into both eyes daily. 1 drop in left eye BiD and 1 drop to right eye daily   Cetirizine HCl (ALLERGY RELIEF) 10 MG CAPS Take 1 capsule by mouth daily.   famotidine (PEPCID) 40 MG tablet TAKE 1 TABLET BY MOUTH EVERY DAY   fluticasone (FLONASE) 50 MCG/ACT nasal spray SPRAY  2 SPRAYS INTO EACH NOSTRIL EVERY DAY   glipiZIDE (GLUCOTROL XL) 10 MG 24 hr tablet TAKE 1 TABLET BY MOUTH EVERY DAY   hydrochlorothiazide (HYDRODIURIL) 25 MG tablet Take 1 tablet (25 mg total) by mouth daily.   hydrOXYzine (ATARAX) 25 MG tablet Take 25 mg by mouth 2 (two) times daily.   lamoTRIgine (LAMICTAL) 200 MG tablet Take 200 mg by mouth daily. cbc   levothyroxine (SYNTHROID) 100 MCG tablet TAKE 1 TABLET BY MOUTH EVERY DAY   metFORMIN (GLUCOPHAGE-XR) 500 MG 24 hr tablet Take 2 tablets (1,000 mg total) by mouth 2 (two) times daily.   methocarbamol (ROBAXIN) 500 MG tablet Take 1 tablet (500 mg total) by mouth every 8 (eight) hours as needed for muscle spasms.   metoprolol succinate (TOPROL-XL) 50 MG 24 hr tablet Take 1 tablet (50 mg total) by mouth daily.   Multiple Vitamins-Minerals (CENTRUM SILVER PO) Take 1 each by mouth daily.   mupirocin ointment (BACTROBAN) 2 % Apply 1 application. topically 2 (two) times daily.   OneTouch Delica Lancets 27O MISC USE 1 EACH ONCE DAILY USE AS INSTRUCTED.   ONETOUCH ULTRA test strip 3 (three) times daily.   senna (SENOKOT) 8.6 MG TABS tablet Take 1 tablet (8.6 mg total) by mouth daily as needed for mild constipation.   traZODone (DESYREL) 50 MG tablet Take 50 mg by mouth at bedtime as needed.   TRELEGY ELLIPTA 100-62.5-25 MCG/ACT AEPB TAKE 1 PUFF BY MOUTH EVERY DAY   venlafaxine XR (EFFEXOR-XR) 75 MG 24 hr capsule Take 150 mg by mouth in the morning and at bedtime.    [DISCONTINUED] doxycycline (VIBRA-TABS) 100 MG tablet Take 1 tablet (100 mg total) by mouth daily.       11/20/2021    9:58 AM 08/22/2021    1:40 PM 08/18/2021    1:15 PM 05/22/2021    4:29 PM  GAD 7 : Generalized Anxiety Score  Nervous, Anxious, on Edge 0 2 0 0  Control/stop worrying 0 2 0 0  Worry too much - different things 0 1 0 0  Trouble relaxing 0 0 0 0  Restless 1 0 0 0  Easily annoyed or irritable 0 0 0 0  Afraid - awful might happen 0 0 0 0  Total GAD 7 Score 1 5 0 0   Anxiety Difficulty Not difficult at all Not difficult at all Not difficult at all Not difficult at all       11/20/2021    9:57 AM 08/23/2021    1:33 PM 08/22/2021    1:39 PM  Depression screen PHQ 2/9  Decreased Interest 0 0 2  Down, Depressed, Hopeless 1 0 0  PHQ - 2 Score 1 0 2  Altered sleeping 0  0  Tired, decreased energy 0  2  Change in appetite 0  2  Feeling bad or failure about yourself  1  0  Trouble concentrating 0  1  Moving slowly or fidgety/restless 0  2  Suicidal thoughts 0  0  PHQ-9 Score 2  9  Difficult doing work/chores Somewhat difficult  Not difficult at all    BP Readings from Last 3 Encounters:  11/20/21 (!) 150/80  11/09/21 (!) 143/70  10/31/21 (!) 150/71    Physical Exam Vitals and nursing note reviewed. Exam conducted with a chaperone present.  Constitutional:      General: She is not in acute distress.    Appearance: She is not diaphoretic.  HENT:     Head: Normocephalic and atraumatic.     Right Ear: External ear normal.     Left Ear: External ear normal.     Nose: Nose normal.  Eyes:     General:        Right eye: No discharge.        Left eye: No discharge.     Conjunctiva/sclera: Conjunctivae normal.     Pupils: Pupils are equal, round, and reactive to light.  Neck:     Thyroid: No thyromegaly.     Vascular: No JVD.  Cardiovascular:     Rate and Rhythm: Normal rate and regular rhythm.     Heart sounds: Normal heart sounds. No murmur heard.    No friction rub. No gallop.  Pulmonary:     Effort: Pulmonary effort is normal.     Breath sounds: Normal breath sounds.  Abdominal:     General: Bowel sounds are normal.     Palpations: Abdomen is soft. There is no mass.     Tenderness: There is no abdominal tenderness. There is no guarding.  Musculoskeletal:        General: Normal range of motion.     Cervical back: Normal range of motion and neck supple.  Lymphadenopathy:     Cervical: No cervical adenopathy.  Skin:    General: Skin  is warm and dry.  Neurological:     Mental Status: She is alert.     Deep Tendon Reflexes: Reflexes are normal and symmetric.     Wt Readings from Last 3 Encounters:  11/20/21 135 lb (61.2 kg)  11/08/21 134 lb (60.8 kg)  10/31/21 134 lb 4.2 oz (60.9 kg)    BP (!) 150/80   Pulse 68   Ht 5' 1"  (1.549 m)   Wt 135 lb (61.2 kg)   BMI 25.51 kg/m   Assessment and Plan:  1. Essential hypertension Chronic.  Uncontrolled because patient did not take medication prior to coming.  Stable.  Blood pressure on second reading is 146/84.  Patient will continue amlodipine 5 mg once a day, hydrochlorothiazide 25 mg once a day, and metoprolol XL 50 mg once a day.  We will recheck in 4 months when patient returns for diabetic check - amLODipine (NORVASC) 5 MG tablet; Take 1 tablet (5 mg total) by mouth daily.  Dispense: 90 tablet; Refill: 0 - hydrochlorothiazide (HYDRODIURIL) 25 MG tablet; Take 1 tablet (25 mg total) by mouth daily.  Dispense: 90 tablet; Refill: 0 - metoprolol succinate (TOPROL-XL) 50 MG 24 hr tablet; Take 1 tablet (50 mg total) by mouth daily.  Dispense: 90 tablet; Refill: 1  2. Familial hypercholesterolemia Chronic.  Controlled.  Stable.  Continue atorvastatin 80 mg once a day.  We will check lipid panel prior LDL surveillance.  We will recheck patient in 6 months. - atorvastatin (LIPITOR) 80 MG tablet; Take 1 tablet (80 mg total) by mouth daily.  Dispense: 90 tablet; Refill: 0 - Lipid Panel With LDL/HDL Ratio  3. Gastroesophageal reflux disease without esophagitis Chronic.  Controlled.  Stable.  Continue famotidine 40 mg once a day. - famotidine (PEPCID) 40 MG tablet; Take 1 tablet (40 mg total) by mouth daily.  Dispense: 90 tablet; Refill: 0  4. Esophageal dysphagia Chronic.  Controlled.  Stable.  Continue famotidine 40 mg once a day. - famotidine (PEPCID) 40 MG tablet; Take 1 tablet (40 mg total) by mouth daily.  Dispense: 90 tablet; Refill: 0  5. Type 2 diabetes mellitus  with diabetic neuropathy, without long-term current use of insulin (HCC) Chronic.  Controlled.  Stable.  Continue glipizide XL 1 tablet daily, metformin XR 500 mg 2 tablets daily and will check A1c and microalbuminuria for current status of control. - glipiZIDE (GLUCOTROL XL) 10 MG 24 hr tablet; Take 1 tablet (10 mg total) by mouth daily.  Dispense: 90 tablet; Refill: 0 - metFORMIN (GLUCOPHAGE-XR) 500 MG 24 hr tablet; Take 2 tablets (1,000 mg total) by mouth 2 (two) times daily.  Dispense: 360 tablet; Refill: 0 - HgB A1c - Microalbumin, urine  6. Acquired hypothyroidism Chronic.  Controlled.  Stable.  Continue levothyroxine 100 mcg daily. - levothyroxine (SYNTHROID) 100 MCG tablet; Take 1 tablet (100 mcg total) by mouth daily.  Dispense: 90 tablet; Refill: 0  7. Seasonal allergic rhinitis due to pollen Chronic.  Episodic.  Stable.  In general is controlled with Flonase 2 sprays each nostril daily. - fluticasone (FLONASE) 50 MCG/ACT nasal spray; SPRAY 2 SPRAYS INTO EACH NOSTRIL EVERY DAY  Dispense: 16 mL; Refill: 0  8. Urinary incontinence, unspecified type New onset.  Patient has incontinence that she has frequent accidents.  These appear to be more stress urinary incontinence but there may be also a mixed component with some urge.  We will refer to urology for evaluation and treatment. - Ambulatory referral to Urology  9. Cigarette nicotine dependence without complication Patient has been advised of the health risks of smoking and counseled concerning cessation of tobacco products. I spent over 3 minutes for discussion and to answer questions.

## 2021-11-21 ENCOUNTER — Ambulatory Visit (INDEPENDENT_AMBULATORY_CARE_PROVIDER_SITE_OTHER): Payer: Medicare Other | Admitting: Neurosurgery

## 2021-11-21 VITALS — BP 168/86 | HR 86 | Temp 98.1°F | Ht 61.0 in | Wt 134.9 lb

## 2021-11-21 DIAGNOSIS — G894 Chronic pain syndrome: Secondary | ICD-10-CM

## 2021-11-21 DIAGNOSIS — Z9689 Presence of other specified functional implants: Secondary | ICD-10-CM

## 2021-11-21 LAB — HEMOGLOBIN A1C
Est. average glucose Bld gHb Est-mCnc: 148 mg/dL
Hgb A1c MFr Bld: 6.8 % — ABNORMAL HIGH (ref 4.8–5.6)

## 2021-11-21 LAB — LIPID PANEL WITH LDL/HDL RATIO
Cholesterol, Total: 152 mg/dL (ref 100–199)
HDL: 54 mg/dL (ref >39)
LDL Chol Calc (NIH): 68 mg/dL (ref 0–99)
LDL/HDL Ratio: 1.3 ratio (ref 0.0–3.2)
Triglycerides: 181 mg/dL — ABNORMAL HIGH (ref 0–149)
VLDL Cholesterol Cal: 30 mg/dL (ref 5–40)

## 2021-11-21 LAB — MICROALBUMIN, URINE: Microalbumin, Urine: 12.8 ug/mL

## 2021-11-21 NOTE — Progress Notes (Signed)
   REFERRING PHYSICIAN:  Meade Maw, [address],  [place] [address]  DOS: 11/08/21 SCS placement  HISTORY OF PRESENT ILLNESS: Mary Nicholson is approximately 2 weeks status post placement of SCS. she is doing fair. She reports continued left radiating leg pain.  Her son is with her who reports some oozing from her flank incision increased puffiness of this incision today.  The patient has not had any fevers or chills.  PHYSICAL EXAMINATION:  General: Patient is well developed, well nourished, calm, collected, and in no apparent distress.   NEUROLOGICAL:  General: In no acute distress.   Awake, alert, oriented to person, place, and time.  Pupils equal round and reactive to light.  Facial tone is symmetric.   Strength:            Side Iliopsoas Quads Hamstring PF DF EHL  R '5 5 5 5 5 5  '$ L '5 5 5 5 5 5   '$ Incision c/d/I with mild erythema on right flank incision.  No obvious signs of infection.  ROS (Neurologic):  Negative except as noted above  IMAGING: No interval imaging to review  ASSESSMENT/PLAN:  Mary Nicholson is doing fair  approximately 2 weeks after SCS placement.  She continues to have left leg pain.  They are making adjustments to her stimulator settings today.  I instructed her to keep a close eye on her incision and let us know if it does not continue to improve.  At this time I have low suspicion for infection.  she will follow up in 4 weeks to see Dr. Izora Ribas or sooner should she have any questions or concerns.  She expressed understanding was in with this plan.   Advised to contact the office if any questions or concerns arise.  Cooper Render PA-C Department of neurosurgery

## 2021-11-23 ENCOUNTER — Encounter: Payer: Self-pay | Admitting: *Deleted

## 2021-11-30 ENCOUNTER — Other Ambulatory Visit: Payer: Self-pay | Admitting: Family Medicine

## 2021-11-30 DIAGNOSIS — J301 Allergic rhinitis due to pollen: Secondary | ICD-10-CM

## 2021-11-30 NOTE — Telephone Encounter (Signed)
Called pharmacy to verify last refill for medication. Pharmacy staff reports medication can be refilled for 11/20/21 request sent in.

## 2021-11-30 NOTE — Telephone Encounter (Signed)
Called pharmacy to verify when last refill. Pharmacy staff reports medication can be refilled from 11/20/21 request.  Requested Prescriptions  Refused Prescriptions Disp Refills  . fluticasone (FLONASE) 50 MCG/ACT nasal spray [Pharmacy Med Name: FLUTICASONE PROP 50 MCG SPRAY] 16 mL 0    Sig: SPRAY 2 SPRAYS INTO EACH NOSTRIL EVERY DAY     Ear, Nose, and Throat: Nasal Preparations - Corticosteroids Passed - 11/30/2021  2:36 PM      Passed - Valid encounter within last 12 months    Recent Outpatient Visits          1 week ago Essential hypertension   [place], Deanna C, MD   3 months ago Essential hypertension   [place], MD   3 months ago Syncope, unspecified syncope type   [facility] Juline Patch, MD   3 months ago [facility] Juline Patch, MD   6 months ago Familial hypercholesterolemia   [place], MD      Future Appointments            In 3 months Juline Patch, MD [facility], [facility]

## 2021-12-21 ENCOUNTER — Encounter: Payer: Medicare Other | Admitting: Neurosurgery

## 2021-12-27 ENCOUNTER — Other Ambulatory Visit: Payer: Self-pay | Admitting: Family Medicine

## 2021-12-27 DIAGNOSIS — J301 Allergic rhinitis due to pollen: Secondary | ICD-10-CM

## 2021-12-28 DIAGNOSIS — R0683 Snoring: Secondary | ICD-10-CM | POA: Diagnosis not present

## 2021-12-28 DIAGNOSIS — E538 Deficiency of other specified B group vitamins: Secondary | ICD-10-CM | POA: Diagnosis not present

## 2021-12-28 DIAGNOSIS — R404 Transient alteration of awareness: Secondary | ICD-10-CM | POA: Diagnosis not present

## 2021-12-28 DIAGNOSIS — G8929 Other chronic pain: Secondary | ICD-10-CM | POA: Diagnosis not present

## 2021-12-28 DIAGNOSIS — H9313 Tinnitus, bilateral: Secondary | ICD-10-CM | POA: Diagnosis not present

## 2021-12-28 DIAGNOSIS — R7989 Other specified abnormal findings of blood chemistry: Secondary | ICD-10-CM | POA: Diagnosis not present

## 2021-12-28 DIAGNOSIS — F17200 Nicotine dependence, unspecified, uncomplicated: Secondary | ICD-10-CM | POA: Diagnosis not present

## 2021-12-28 DIAGNOSIS — G939 Disorder of brain, unspecified: Secondary | ICD-10-CM | POA: Diagnosis not present

## 2021-12-28 DIAGNOSIS — E569 Vitamin deficiency, unspecified: Secondary | ICD-10-CM | POA: Diagnosis not present

## 2021-12-28 DIAGNOSIS — G894 Chronic pain syndrome: Secondary | ICD-10-CM | POA: Diagnosis not present

## 2021-12-28 DIAGNOSIS — M5442 Lumbago with sciatica, left side: Secondary | ICD-10-CM | POA: Diagnosis not present

## 2022-01-04 ENCOUNTER — Encounter: Payer: Medicare Other | Admitting: Neurosurgery

## 2022-01-10 ENCOUNTER — Telehealth: Payer: Self-pay | Admitting: *Deleted

## 2022-01-10 NOTE — Patient Outreach (Signed)
  Care Coordination   01/10/2022 Name: Tonee Silverstein MRN: [id number] DOB: 08/30/1955   Care Coordination Outreach Attempts:  An unsuccessful telephone outreach was attempted today to offer the patient information about available care coordination services as a benefit of their health plan.   Follow Up Plan:  Additional outreach attempts will be made to offer the patient care coordination information and services.   Encounter Outcome:  No Answer  Care Coordination Interventions Activated:  Yes   Care Coordination Interventions:  No, not indicated    [facility] [facility] [facility] [phone]

## 2022-01-15 DIAGNOSIS — M7918 Myalgia, other site: Secondary | ICD-10-CM | POA: Diagnosis not present

## 2022-01-15 DIAGNOSIS — M5481 Occipital neuralgia: Secondary | ICD-10-CM | POA: Diagnosis not present

## 2022-01-15 DIAGNOSIS — M542 Cervicalgia: Secondary | ICD-10-CM | POA: Diagnosis not present

## 2022-01-17 ENCOUNTER — Other Ambulatory Visit: Payer: Self-pay | Admitting: Family Medicine

## 2022-01-17 DIAGNOSIS — E1140 Type 2 diabetes mellitus with diabetic neuropathy, unspecified: Secondary | ICD-10-CM

## 2022-01-17 NOTE — Telephone Encounter (Signed)
Requested Prescriptions  Pending Prescriptions Disp Refills  . metFORMIN (GLUCOPHAGE-XR) 500 MG 24 hr tablet [Pharmacy Med Name: METFORMIN HCL ER 500 MG TABLET] 360 tablet 0    Sig: TAKE 2 TABLETS BY MOUTH TWICE A DAY     Endocrinology:  Diabetes - Biguanides Failed - 01/17/2022  2:50 PM      Failed - B12 Level in normal range and within 720 days    No results found for: "VITAMINB12"       Failed - CBC within normal limits and completed in the last 12 months    WBC  Date Value Ref Range Status  10/31/2021 7.4 4.0 - 10.5 K/uL Final   RBC  Date Value Ref Range Status  10/31/2021 4.70 3.87 - 5.11 MIL/uL Final   Hemoglobin  Date Value Ref Range Status  10/31/2021 14.0 12.0 - 15.0 g/dL Final   HCT  Date Value Ref Range Status  10/31/2021 42.8 36.0 - 46.0 % Final   MCHC  Date Value Ref Range Status  10/31/2021 32.7 30.0 - 36.0 g/dL Final   [facility]  Date Value Ref Range Status  10/31/2021 29.8 26.0 - 34.0 pg Final   MCV  Date Value Ref Range Status  10/31/2021 91.1 80.0 - 100.0 fL Final   No results found for: "PLTCOUNTKUC", "LABPLAT", "POCPLA" RDW  Date Value Ref Range Status  10/31/2021 14.3 11.5 - 15.5 % Final         Passed - Cr in normal range and within 360 days    Creatinine, Ser  Date Value Ref Range Status  10/31/2021 0.77 0.44 - 1.00 mg/dL Final         Passed - HBA1C is between 0 and 7.9 and within 180 days    Hemoglobin A1C  Date Value Ref Range Status  03/24/2020 7.4  Final   Hgb A1c MFr Bld  Date Value Ref Range Status  11/20/2021 6.8 (H) 4.8 - 5.6 % Final    Comment:             Prediabetes: 5.7 - 6.4          Diabetes: >6.4          Glycemic control for adults with diabetes: <7.0          Passed - eGFR in normal range and within 360 days    GFR calc Af Amer  Date Value Ref Range Status  11/14/2019 >60 >60 mL/min Final    Comment:    Performed at [facility], [address] [place], [place] [address]   GFR, Estimated  Date  Value Ref Range Status  10/31/2021 >60 >60 mL/min Final    Comment:    (NOTE) Calculated using the CKD-EPI Creatinine Equation (2021)    eGFR  Date Value Ref Range Status  05/22/2021 97 >59 mL/min/1.73 Final         Passed - Valid encounter within last 6 months    Recent Outpatient Visits          1 month ago Essential hypertension   [place] [facility] at [place], Deanna C, MD   4 months ago Essential hypertension   [place] [facility] at [place], Deanna C, MD   5 months ago Syncope, unspecified syncope type   [facility] and Sports Medicine at [place], Deanna C, MD   5 months ago [place]  [facility] at [employer], MD   8 months ago Familial hypercholesterolemia   [facility] at [facility], MD      Future Appointments            In 2 months Juline Patch, MD [facility] Medicine at [facility], [facility]

## 2022-01-22 ENCOUNTER — Other Ambulatory Visit: Payer: Self-pay | Admitting: Family Medicine

## 2022-01-22 DIAGNOSIS — J301 Allergic rhinitis due to pollen: Secondary | ICD-10-CM

## 2022-01-23 NOTE — Telephone Encounter (Signed)
Requested Prescriptions  Pending Prescriptions Disp Refills  . fluticasone (FLONASE) 50 MCG/ACT nasal spray [Pharmacy Med Name: FLUTICASONE PROP 50 MCG SPRAY] 16 mL 0    Sig: SPRAY 2 SPRAYS INTO EACH NOSTRIL EVERY DAY     Ear, Nose, and Throat: Nasal Preparations - Corticosteroids Passed - 01/22/2022  1:32 PM      Passed - Valid encounter within last 12 months    Recent Outpatient Visits          2 months ago Essential hypertension   [place] [facility] at [place], Deanna C, MD   5 months ago Essential hypertension   [place] [facility] at [place], [place], MD   5 months ago Syncope, unspecified syncope type   [facility] and Sports Medicine at [place], [place], MD   5 months ago [place] [facility] at [place], [place], MD   8 months ago Familial hypercholesterolemia   [place] [facility] at [place], [place], MD      Future Appointments            In 2 months Juline Patch, MD [place] [facility] Medicine at [facility], [facility]

## 2022-01-25 ENCOUNTER — Ambulatory Visit (INDEPENDENT_AMBULATORY_CARE_PROVIDER_SITE_OTHER): Payer: Medicare Other | Admitting: Neurosurgery

## 2022-01-25 VITALS — BP 171/86 | HR 89 | Ht 61.0 in | Wt 132.0 lb

## 2022-01-25 DIAGNOSIS — G894 Chronic pain syndrome: Secondary | ICD-10-CM

## 2022-01-25 DIAGNOSIS — Z09 Encounter for follow-up examination after completed treatment for conditions other than malignant neoplasm: Secondary | ICD-10-CM

## 2022-01-25 NOTE — Progress Notes (Signed)
   REFERRING PHYSICIAN:  Meade Maw, [place] [address] [place],  [place] [address]  DOS: 11/08/21 SCS placement  HISTORY OF PRESENT ILLNESS: Mary Nicholson is status post placement of SCS.  Having some discomfort in her left leg, but she is much better than she was prior to placement of her stimulator.  She is very happy with her results.   PHYSICAL EXAMINATION:  General: Patient is well developed, well nourished, calm, collected, and in no apparent distress.   NEUROLOGICAL:  General: In no acute distress.   Awake, alert, oriented to person, place, and time.  Pupils equal round and reactive to light.  Facial tone is symmetric.   Strength:            Side Iliopsoas Quads Hamstring PF DF EHL  R '5 5 5 5 5 5  '$ L '5 5 5 5 5 5   '$ Incision c/d/I with mild erythema on right flank incision.  No obvious signs of infection.  ROS (Neurologic):  Negative except as noted above  IMAGING: No interval imaging to review  ASSESSMENT/PLAN:  Mary Nicholson is doing well after her spinal cord stimulator placement.  She will continue with remapping of her programs to try to help with her left leg pain.  I will see her back on an as-needed basis.  She is off all lifting restrictions.    Meade Maw MD Department of neurosurgery

## 2022-02-12 ENCOUNTER — Other Ambulatory Visit: Payer: Self-pay | Admitting: Family Medicine

## 2022-02-12 DIAGNOSIS — L732 Hidradenitis suppurativa: Secondary | ICD-10-CM

## 2022-02-12 DIAGNOSIS — M542 Cervicalgia: Secondary | ICD-10-CM | POA: Diagnosis not present

## 2022-02-13 NOTE — Telephone Encounter (Signed)
Requested medication (s) are due for refill today -no  Requested medication (s) are on the active medication list -no  Future visit scheduled -yes  Last refill: 08/18/21  Notes to clinic: medication no longer on current medication list- not assigned protocol- PCP review   Requested Prescriptions  Pending Prescriptions Disp Refills   doxycycline (VIBRA-TABS) 100 MG tablet [Pharmacy Med Name: DOXYCYCLINE HYCLATE 100 MG TAB] 30 tablet 5    Sig: TAKE 1 TABLET BY MOUTH EVERY DAY     Off-Protocol Failed - 02/12/2022  1:31 AM      Failed - Medication not assigned to a protocol, review manually.      Passed - Valid encounter within last 12 months    Recent Outpatient Visits           2 months ago Essential hypertension   [place] [facility] at [place], [place], MD   5 months ago Essential hypertension   [place] [facility] at [place], [place], MD   5 months ago Syncope, unspecified syncope type   [facility] and [facility] Medicine at [place], Deanna C, MD   6 months ago [place] and Sports Medicine at [place], Deanna C, MD   8 months ago Familial hypercholesterolemia   [place] [facility] at [place], Deanna C, MD       Future Appointments             In 1 month Juline Patch, MD [facility] Primary Care and Sports Medicine at [facility], [facility]               Requested Prescriptions  Pending Prescriptions Disp Refills   doxycycline (VIBRA-TABS) 100 MG tablet [Pharmacy Med Name: DOXYCYCLINE HYCLATE 100 MG TAB] 30 tablet 5    Sig: TAKE 1 TABLET BY MOUTH EVERY DAY     Off-Protocol Failed - 02/12/2022  1:31 AM      Failed - Medication not assigned to a protocol, review manually.      Passed - Valid encounter within last 12 months    Recent Outpatient Visits           2  months ago Essential hypertension   [place] [facility] at [place], [place], MD   5 months ago Essential hypertension   [place] [facility] at [place], [place], MD   5 months ago Syncope, unspecified syncope type   [facility] Primary Care and Sports Medicine at [place], Deanna C, MD   6 months ago [place] [facility] Sports Medicine at [place], [place], MD   8 months ago Familial hypercholesterolemia   [place] [facility] at [place], [place], MD       Future Appointments             In 1 month Juline Patch, MD [place] [facility] and [facility] Medicine at [facility], [facility]

## 2022-02-16 DIAGNOSIS — M542 Cervicalgia: Secondary | ICD-10-CM | POA: Diagnosis not present

## 2022-02-20 ENCOUNTER — Other Ambulatory Visit: Payer: Self-pay | Admitting: Family Medicine

## 2022-02-20 DIAGNOSIS — J301 Allergic rhinitis due to pollen: Secondary | ICD-10-CM

## 2022-02-23 DIAGNOSIS — M542 Cervicalgia: Secondary | ICD-10-CM | POA: Diagnosis not present

## 2022-02-28 DIAGNOSIS — M542 Cervicalgia: Secondary | ICD-10-CM | POA: Diagnosis not present

## 2022-02-28 DIAGNOSIS — R404 Transient alteration of awareness: Secondary | ICD-10-CM | POA: Diagnosis not present

## 2022-02-28 DIAGNOSIS — R0683 Snoring: Secondary | ICD-10-CM | POA: Diagnosis not present

## 2022-02-28 DIAGNOSIS — R531 Weakness: Secondary | ICD-10-CM | POA: Diagnosis not present

## 2022-02-28 DIAGNOSIS — F17200 Nicotine dependence, unspecified, uncomplicated: Secondary | ICD-10-CM | POA: Diagnosis not present

## 2022-02-28 DIAGNOSIS — G939 Disorder of brain, unspecified: Secondary | ICD-10-CM | POA: Diagnosis not present

## 2022-02-28 DIAGNOSIS — E569 Vitamin deficiency, unspecified: Secondary | ICD-10-CM | POA: Diagnosis not present

## 2022-02-28 DIAGNOSIS — M5481 Occipital neuralgia: Secondary | ICD-10-CM | POA: Diagnosis not present

## 2022-03-07 DIAGNOSIS — M542 Cervicalgia: Secondary | ICD-10-CM | POA: Diagnosis not present

## 2022-03-17 ENCOUNTER — Other Ambulatory Visit: Payer: Self-pay | Admitting: Family Medicine

## 2022-03-17 DIAGNOSIS — J301 Allergic rhinitis due to pollen: Secondary | ICD-10-CM

## 2022-03-23 ENCOUNTER — Other Ambulatory Visit: Payer: Self-pay | Admitting: Family Medicine

## 2022-03-23 DIAGNOSIS — M542 Cervicalgia: Secondary | ICD-10-CM | POA: Diagnosis not present

## 2022-03-23 NOTE — Telephone Encounter (Signed)
Requested medication (s) are due for refill today: routing for review  Requested medication (s) are on the active medication list: yes  Last refill:  10/16/21  Future visit scheduled: yes  Notes to clinic:  Medication not assigned to a protocol, review manually.      Requested Prescriptions  Pending Prescriptions Disp [place] 100-62.5-25 MCG/ACT AEPB [Pharmacy Med Name: TRELEGY ELLIPTA 100-62.5-25] 60 each     Sig: INHALE 1 PUFF BY MOUTH EVERY DAY     Off-Protocol Failed - 03/23/2022  3:53 PM      Failed - Medication not assigned to a protocol, review manually.      Passed - Valid encounter within last 12 months    Recent Outpatient Visits           4 months ago Essential hypertension   [place] [facility] at [place], Deanna C, MD   7 months ago Essential hypertension   [place] [facility] at [place], [place], MD   7 months ago Syncope, unspecified syncope type   [facility] and Sports Medicine at [place], Deanna C, MD   7 months ago [place] and Sports Medicine at [place], [place], MD   10 months ago Familial hypercholesterolemia   [place] [facility] at [place], [place], MD       Future Appointments             In 4 days Juline Patch, MD [facility] Primary Care and Sports Medicine at [facility], [facility]

## 2022-03-27 ENCOUNTER — Ambulatory Visit: Payer: Medicare Other | Admitting: Family Medicine

## 2022-03-27 NOTE — Progress Notes (Signed)
[facility] Quality Team Note  Name: Mary Nicholson Date of Birth: Jun 28, 1955 MRN: [id number] Date: 03/27/2022  [facility] Quality Team has reviewed this patient's chart, please see recommendations below:  [facility] Quality Other; (KED GAP- PATIENT HAS eGFR BUT NEEDS URINE MICROALBUMIN/CREATININE RATIO TO CLOSE 2023 KED GAP)

## 2022-04-02 ENCOUNTER — Ambulatory Visit (INDEPENDENT_AMBULATORY_CARE_PROVIDER_SITE_OTHER): Payer: Medicare Other | Admitting: Family Medicine

## 2022-04-02 ENCOUNTER — Encounter: Payer: Self-pay | Admitting: Family Medicine

## 2022-04-02 VITALS — BP 150/84 | HR 59 | Ht 61.0 in | Wt 136.0 lb

## 2022-04-02 DIAGNOSIS — J301 Allergic rhinitis due to pollen: Secondary | ICD-10-CM | POA: Diagnosis not present

## 2022-04-02 DIAGNOSIS — E7801 Familial hypercholesterolemia: Secondary | ICD-10-CM | POA: Diagnosis not present

## 2022-04-02 DIAGNOSIS — N3281 Overactive bladder: Secondary | ICD-10-CM | POA: Diagnosis not present

## 2022-04-02 DIAGNOSIS — R1319 Other dysphagia: Secondary | ICD-10-CM

## 2022-04-02 DIAGNOSIS — J449 Chronic obstructive pulmonary disease, unspecified: Secondary | ICD-10-CM | POA: Diagnosis not present

## 2022-04-02 DIAGNOSIS — K219 Gastro-esophageal reflux disease without esophagitis: Secondary | ICD-10-CM

## 2022-04-02 DIAGNOSIS — E1140 Type 2 diabetes mellitus with diabetic neuropathy, unspecified: Secondary | ICD-10-CM | POA: Diagnosis not present

## 2022-04-02 DIAGNOSIS — E039 Hypothyroidism, unspecified: Secondary | ICD-10-CM | POA: Diagnosis not present

## 2022-04-02 DIAGNOSIS — I10 Essential (primary) hypertension: Secondary | ICD-10-CM | POA: Diagnosis not present

## 2022-04-02 MED ORDER — FLUTICASONE PROPIONATE 50 MCG/ACT NA SUSP: NASAL | 0 refills | Status: DC

## 2022-04-02 MED ORDER — ALBUTEROL SULFATE HFA 108 (90 BASE) MCG/ACT IN AERS: INHALATION_SPRAY | RESPIRATORY_TRACT | 2 refills | Status: DC

## 2022-04-02 MED ORDER — HYDROCHLOROTHIAZIDE 25 MG PO TABS: 25.0000 mg | ORAL_TABLET | Freq: Every day | ORAL | 1 refills | Status: DC

## 2022-04-02 MED ORDER — GLIPIZIDE ER 10 MG PO TB24: 10.0000 mg | ORAL_TABLET | Freq: Every day | ORAL | 1 refills | Status: DC

## 2022-04-02 MED ORDER — FAMOTIDINE 40 MG PO TABS: 40.0000 mg | ORAL_TABLET | Freq: Every day | ORAL | 1 refills | Status: DC

## 2022-04-02 MED ORDER — OXYBUTYNIN CHLORIDE ER 5 MG PO TB24: 5.0000 mg | ORAL_TABLET | Freq: Every day | ORAL | 1 refills | Status: DC

## 2022-04-02 MED ORDER — TRELEGY ELLIPTA 100-62.5-25 MCG/ACT IN AEPB: INHALATION_SPRAY | RESPIRATORY_TRACT | 1 refills | Status: DC

## 2022-04-02 MED ORDER — LEVOTHYROXINE SODIUM 100 MCG PO TABS: 100.0000 ug | ORAL_TABLET | Freq: Every day | ORAL | 1 refills | Status: DC

## 2022-04-02 MED ORDER — METFORMIN HCL ER 500 MG PO TB24: 1000.0000 mg | ORAL_TABLET | Freq: Two times a day (BID) | ORAL | 0 refills | Status: DC

## 2022-04-02 MED ORDER — METOPROLOL SUCCINATE ER 50 MG PO TB24: 50.0000 mg | ORAL_TABLET | Freq: Every day | ORAL | 1 refills | Status: DC

## 2022-04-02 MED ORDER — AMLODIPINE BESYLATE 10 MG PO TABS: 10.0000 mg | ORAL_TABLET | Freq: Every day | ORAL | 0 refills | Status: DC

## 2022-04-02 MED ORDER — ATORVASTATIN CALCIUM 80 MG PO TABS: 80.0000 mg | ORAL_TABLET | Freq: Every day | ORAL | 1 refills | Status: DC

## 2022-04-02 NOTE — Progress Notes (Signed)
Date:  04/02/2022   Name:  Mary Nicholson   DOB:  1956-02-04   MRN:  [id number]   Chief Complaint: Diabetes, COPD, Hypertension, Hyperlipidemia, Gastroesophageal Reflux, Allergic Rhinitis , and Hypothyroidism  Diabetes She presents for her follow-up diabetic visit. She has type 2 diabetes mellitus. Her disease course has been stable. Pertinent negatives for hypoglycemia include no headaches, nervousness/anxiousness or sweats. Pertinent negatives for diabetes include no blurred vision, no chest pain, no fatigue and no weight loss. There are no hypoglycemic complications. Symptoms are stable. There are no diabetic complications. Pertinent negatives for diabetic complications include no CVA, PVD or retinopathy. Risk factors for coronary artery disease include hypertension, diabetes mellitus and dyslipidemia. Current diabetic treatment includes oral agent (dual therapy). Her weight is stable. She is following a generally healthy diet. Her breakfast blood glucose is taken between 8-9 am. Her breakfast blood glucose range is generally 110-130 mg/dl.  COPD There is no cough or shortness of breath. Pertinent negatives include no chest pain, headaches, heartburn, malaise/fatigue, PND, sweats or weight loss. Her symptoms are alleviated by beta-agonist. She reports moderate improvement on treatment. Her past medical history is significant for COPD.  Hypertension This is a chronic problem. The current episode started more than 1 year ago. The problem has been gradually improving since onset. The problem is uncontrolled. Pertinent negatives include no anxiety, blurred vision, chest pain, headaches, malaise/fatigue, neck pain, orthopnea, palpitations, peripheral edema, PND, shortness of breath or sweats. There are no associated agents to hypertension. Past treatments include beta blockers, calcium channel blockers and diuretics. The current treatment provides moderate improvement. There are no compliance  problems.  There is no history of angina, kidney disease, CAD/MI, CVA, heart failure, left ventricular hypertrophy, PVD or retinopathy. Identifiable causes of hypertension include a thyroid problem. There is no history of chronic renal disease, a hypertension causing med or renovascular disease.  Hyperlipidemia This is a chronic problem. The current episode started more than 1 year ago. The problem is controlled. Recent lipid tests were reviewed and are normal. She has no history of chronic renal disease. Pertinent negatives include no chest pain or shortness of breath.  Gastroesophageal Reflux She reports no chest pain, no choking, no coughing, no dysphagia or no heartburn. Pertinent negatives include no fatigue or weight loss. She has tried a histamine-2 antagonist for the symptoms. The treatment provided moderate relief.  Thyroid Problem Presents for follow-up visit. Symptoms include heat intolerance. Patient reports no anxiety, cold intolerance, constipation, depressed mood, diarrhea, dry skin, fatigue, hair loss, menstrual problem, nail problem, palpitations, weight gain or weight loss. The symptoms have been stable. Her past medical history is significant for hyperlipidemia. There is no history of heart failure.  URI  Pertinent negatives include no chest pain, coughing, diarrhea, headaches or neck pain. She has tried NSAIDs for the symptoms. The treatment provided moderate relief.  Urinary Frequency  This is a chronic problem. The current episode started more than 1 month ago (2 month). Associated symptoms include frequency. Pertinent negatives include no chills, discharge, hematuria, hesitancy, sweats or urgency.    Lab Results  Component Value Date   NA 138 10/31/2021   K 4.2 11/08/2021   CO2 26 10/31/2021   GLUCOSE 124 (H) 10/31/2021   BUN 13 10/31/2021   CREATININE 0.77 10/31/2021   CALCIUM 9.7 10/31/2021   EGFR 97 05/22/2021   GFRNONAA >60 10/31/2021   Lab Results  Component  Value Date   CHOL 152 11/20/2021   HDL 54  11/20/2021   LDLCALC 68 11/20/2021   TRIG 181 (H) 11/20/2021   Lab Results  Component Value Date   TSH 3.790 05/22/2021   Lab Results  Component Value Date   HGBA1C 6.8 (H) 11/20/2021   Lab Results  Component Value Date   WBC 7.4 10/31/2021   HGB 14.0 10/31/2021   HCT 42.8 10/31/2021   MCV 91.1 10/31/2021   PLT 359 10/31/2021   Lab Results  Component Value Date   ALT 21 07/22/2020   AST 16 07/22/2020   ALKPHOS 103 07/22/2020   BILITOT 0.3 07/22/2020   No results found for: "25OHVITD2", "25OHVITD3", "VD25OH"   Review of Systems  Constitutional:  Negative for chills, fatigue, malaise/fatigue, weight gain and weight loss.  Eyes:  Negative for blurred vision.  Respiratory:  Negative for cough, choking and shortness of breath.   Cardiovascular:  Negative for chest pain, palpitations, orthopnea and PND.  Gastrointestinal:  Negative for constipation, diarrhea, dysphagia and heartburn.  Endocrine: Positive for heat intolerance. Negative for cold intolerance.  Genitourinary:  Positive for frequency. Negative for hematuria, hesitancy, menstrual problem and urgency.  Musculoskeletal:  Negative for neck pain.  Neurological:  Negative for headaches.  Psychiatric/Behavioral:  The patient is not nervous/anxious.     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   Chronic radicular lumbar pain 05/04/2021   History of lumbar fusion 05/04/2021   Chronic pain syndrome 05/04/2021   Bipolar affective disorder, mixed ([place]) 11/26/2019   COPD (chronic obstructive pulmonary disease) ([place]) 11/26/2019   PTSD (post-traumatic stress disorder) 11/26/2019   Schizophrenia ([place]) 11/26/2019   Abdominal wall cellulitis 11/12/2019   Abdominal wall abscess    Type 2 diabetes mellitus with diabetic neuropathy,  without long-term current use of insulin (HCC)    Hyperlipidemia    Bilateral hand numbness 09/16/2019   S/P hysterectomy 09/09/2019   Stage 3b chronic kidney disease ([place]) 09/09/2019   Cervical radiculitis 08/13/2019   Chronic bilateral low back pain with right-sided sciatica 08/13/2019   Lumbar radiculopathy 08/13/2019   Neck pain 08/13/2019   Numbness and tingling of both feet 07/30/2019   Dupuytren contracture 05/22/2019   Personal history of transient ischemic attack (TIA), and cerebral infarction without residual deficits 05/22/2019   Resting tremor 05/22/2019   Diverticulosis 05/20/2019   External hemorrhoid 05/20/2019   Abnormal cervical Papanicolaou smear 12/19/2018   Arthralgia of both hands 12/19/2018   Breast pain, right 12/19/2018   Diarrhea 12/19/2018   Dysphagia 12/19/2018   Paresthesias 12/19/2018   Seasonal allergies 12/19/2018   Gastroesophageal reflux disease 09/23/2015   Spinal stenosis of lumbar region 05/04/2015   Allergic rhinitis 11/28/2014   Acquired hypothyroidism 09/13/2014   Essential hypertension 09/13/2014   Type 2 diabetes mellitus with diabetic neuropathy ([place]) 09/13/2014    No Known Allergies  Past Surgical History:  Procedure Laterality Date   ABDOMINAL HYSTERECTOMY     BACK SURGERY     BREAST BIOPSY Right    negative 1988   BREAST EXCISIONAL BIOPSY Right 1988   CHOLECYSTECTOMY     ESOPHAGOGASTRODUODENOSCOPY (EGD) WITH PROPOFOL N/A 08/08/2021   Procedure: ESOPHAGOGASTRODUODENOSCOPY (EGD) WITH PROPOFOL;  Surgeon: Lucilla Lame, MD;  Location: [facility] END[facility];  Service: Endoscopy;  Laterality: N/A;   THORACIC LAMINECTOMY FOR SPINAL CORD STIMULATOR N/A 11/08/2021   Procedure: THORACIC LAMINECTOMY FOR SPINAL CORD STIMULATOR  IMPLANT (MEDTRONIC);  Surgeon: Meade Maw, MD;  Location: [facility] ORS;  Service: Neurosurgery;  Laterality: N/A;   TUBAL LIGATION      Social History   Tobacco Use   Smoking status: Every Day    Packs/day: 1.00     Years: 57.00    Total pack years: 57.00    Types: Cigarettes   Smokeless tobacco: Never   Tobacco comments:    Has cut back to 1/2 PPD  Vaping Use   Vaping Use: Never used  Substance Use Topics   Alcohol use: Yes    Comment: Occasional   Drug use: Never     Medication list has been reviewed and updated.  Current Meds  Medication Sig   Acetaminophen (TYLENOL ARTHRITIS EXT RELIEF PO) Take 1 capsule by mouth 3 (three) times daily.   albuterol (VENTOLIN HFA) 108 (90 Base) MCG/ACT inhaler INHALE 1-2 PUFFS BY MOUTH EVERY 6 HOURS AS NEEDED FOR WHEEZE OR SHORTNESS OF BREATH   amLODipine (NORVASC) 5 MG tablet Take 1 tablet (5 mg total) by mouth daily.   aspirin 81 MG chewable tablet Chew by mouth.   atorvastatin (LIPITOR) 80 MG tablet Take 1 tablet (80 mg total) by mouth daily.   b complex vitamins capsule Take 1 capsule by mouth daily.   Biotin 5 MG CAPS Take 1 capsule by mouth daily.   busPIRone (BUSPAR) 15 MG tablet Take 15 mg by mouth 3 (three) times daily.   carboxymethylcellulose (REFRESH PLUS) 0.5 % SOLN Place 1 drop into both eyes daily. 1 drop in left eye BiD and 1 drop to right eye daily   famotidine (PEPCID) 40 MG tablet Take 1 tablet (40 mg total) by mouth daily.   fluticasone (FLONASE) 50 MCG/ACT nasal spray SPRAY 2 SPRAYS INTO EACH NOSTRIL EVERY DAY   Fluticasone-Umeclidin-Vilant (TRELEGY ELLIPTA) 100-62.5-25 MCG/ACT AEPB INHALE 1 PUFF BY MOUTH EVERY DAY   gabapentin (NEURONTIN) 300 MG capsule Take 1 capsule by mouth 3 (three) times daily.   glipiZIDE (GLUCOTROL XL) 10 MG 24 hr tablet Take 1 tablet (10 mg total) by mouth daily.   hydrochlorothiazide (HYDRODIURIL) 25 MG tablet Take 1 tablet (25 mg total) by mouth daily.   hydrOXYzine (ATARAX) 25 MG tablet Take 25 mg by mouth 2 (two) times daily.   lamoTRIgine (LAMICTAL) 200 MG tablet Take 200 mg by mouth daily. cbc   levothyroxine (SYNTHROID) 100 MCG tablet Take 1 tablet (100 mcg total) by mouth daily.   metFORMIN  (GLUCOPHAGE-XR) 500 MG 24 hr tablet Take 2 tablets (1,000 mg total) by mouth 2 (two) times daily.   metoprolol succinate (TOPROL-XL) 50 MG 24 hr tablet Take 1 tablet (50 mg total) by mouth daily.   Multiple Vitamins-Minerals (CENTRUM SILVER PO) Take 1 each by mouth daily.   OneTouch Delica Lancets 14H MISC USE 1 EACH ONCE DAILY USE AS INSTRUCTED.   ONETOUCH ULTRA test strip 3 (three) times daily.   traZODone (DESYREL) 50 MG tablet Take 50 mg by mouth at bedtime as needed.   venlafaxine XR (EFFEXOR-XR) 75 MG 24 hr capsule Take 150 mg by mouth in the morning and at bedtime.        04/02/2022    3:01 PM 11/20/2021    9:58 AM 08/22/2021    1:40 PM 08/18/2021    1:15 PM  GAD 7 : Generalized Anxiety Score  Nervous, Anxious, on Edge 0 0 2 0  Control/stop worrying 0 0 2 0  Worry too much - different things 0 0  1 0  Trouble relaxing 0 0 0 0  Restless 0 1 0 0  Easily annoyed or irritable 0 0 0 0  Afraid - awful might happen 0 0 0 0  Total GAD 7 Score 0 1 5 0  Anxiety Difficulty Not difficult at all Not difficult at all Not difficult at all Not difficult at all       04/02/2022    3:01 PM 11/20/2021    9:57 AM 08/23/2021    1:33 PM  Depression screen PHQ 2/9  Decreased Interest 0 0 0  Down, Depressed, Hopeless 0 1 0  PHQ - 2 Score 0 1 0  Altered sleeping 0 0   Tired, decreased energy 0 0   Change in appetite 0 0   Feeling bad or failure about yourself  0 1   Trouble concentrating 0 0   Moving slowly or fidgety/restless 0 0   Suicidal thoughts 0 0   PHQ-9 Score 0 2   Difficult doing work/chores Not difficult at all Somewhat difficult     BP Readings from Last 3 Encounters:  04/02/22 (!) 150/84  01/25/22 (!) 171/86  11/21/21 (!) 168/86    Physical Exam Vitals and nursing note reviewed. Exam conducted with a chaperone present.  Constitutional:      General: She is not in acute distress.    Appearance: She is not diaphoretic.  HENT:     Head: Normocephalic and atraumatic.      Right Ear: External ear normal.     Left Ear: External ear normal.     Nose: Nose normal.  Eyes:     General:        Right eye: No discharge.        Left eye: No discharge.     Conjunctiva/sclera: Conjunctivae normal.     Pupils: Pupils are equal, round, and reactive to light.  Neck:     Thyroid: No thyromegaly.     Vascular: No JVD.  Cardiovascular:     Rate and Rhythm: Normal rate and regular rhythm.     Heart sounds: Normal heart sounds. No murmur heard.    No friction rub. No gallop.  Pulmonary:     Effort: Pulmonary effort is normal.     Breath sounds: Normal breath sounds.  Abdominal:     General: Bowel sounds are normal.     Palpations: Abdomen is soft. There is no mass.     Tenderness: There is no abdominal tenderness. There is no guarding.  Musculoskeletal:        General: Normal range of motion.     Cervical back: Neck supple.  Lymphadenopathy:     Cervical: No cervical adenopathy.  Skin:    General: Skin is warm and dry.  Neurological:     Mental Status: She is alert.     Wt Readings from Last 3 Encounters:  04/02/22 136 lb (61.7 kg)  01/25/22 132 lb (59.9 kg)  11/21/21 134 lb 14.7 oz (61.2 kg)    BP (!) 150/84 (BP Location: Right Arm, Cuff Size: Normal)   Pulse (!) 59   Ht _0  (1.549 m)   Wt 136 lb (61.7 kg)   SpO2 95%   BMI 25.70 kg/m   Assessment and Plan:  1. Essential hypertension   Chronic.  Uncontrolled.  Stable.  Will continue metoprolol XL 50 mg once a day, hydrochlorothiazide 25 mg once a day and will increase amlodipine to 10 mg once a day.  We  we will reemphasize Dash diet approach as well as check an renal function panel.  Patient is to return in 6 weeks for repeat blood pressure check. - hydrochlorothiazide (HYDRODIURIL) 25 MG tablet; Take 1 tablet (25 mg total) by mouth daily.  Dispense: 90 tablet; Refill: 1 - metoprolol succinate (TOPROL-XL) 50 MG 24 hr tablet; Take 1 tablet (50 mg total) by mouth daily.  Dispense: 90 tablet; Refill:  1 - Renal Function Panel - amLODipine (NORVASC) 10 MG tablet; Take 1 tablet (10 mg total) by mouth daily.  Dispense: 60 tablet; Refill: 0  2. Chronic obstructive pulmonary disease, unspecified COPD type (HCC) Chronic.  Controlled.  Stable.  Continue albuterol inhaler 2 puffs as needed for wheezing and shortness of breath. - albuterol (VENTOLIN HFA) 108 (90 Base) MCG/ACT inhaler; INHALE 1-2 PUFFS BY MOUTH EVERY 6 HOURS AS NEEDED FOR WHEEZE OR SHORTNESS OF BREATH  Dispense: 3 each; Refill: 2  3. Familial hypercholesterolemia Chronic.  Controlled.  Stable.  Continue atorvastatin 80 mg once a day. - atorvastatin (LIPITOR) 80 MG tablet; Take 1 tablet (80 mg total) by mouth daily.  Dispense: 90 tablet; Refill: 1  4. Esophageal dysphagia Chronic.  Controlled.  Stable.  Continue Pepcid 40 mg once a day. - famotidine (PEPCID) 40 MG tablet; Take 1 tablet (40 mg total) by mouth daily.  Dispense: 90 tablet; Refill: 1  5. Gastroesophageal reflux disease without esophagitis Chronic.  Controlled.  Stable.  As noted above. - famotidine (PEPCID) 40 MG tablet; Take 1 tablet (40 mg total) by mouth daily.  Dispense: 90 tablet; Refill: 1  6. Seasonal allergic rhinitis due to pollen Chronic.  Episodic.  Primarily daily during pollen season.  Will encourage using Flonase nasal spray 2 sprays each nostril daily. - fluticasone (FLONASE) 50 MCG/ACT nasal spray; SPRAY 2 SPRAYS INTO EACH NOSTRIL EVERY DAY  Dispense: 48 mL; Refill: 0  7. Type 2 diabetes mellitus with diabetic neuropathy, without long-term current use of insulin (HCC) Chronic.  Controlled.  Stable.  Continue glipizide XL 10 mg once a day, metformin XR 500 mg 2 tablets once a day, and we will check A1c microalbuminuria and renal function panel for current status of control.  Exam of the distal extremities were noted to be normal.  We will refer to ophthalmology for evaluation for retinopathy. - glipiZIDE (GLUCOTROL XL) 10 MG 24 hr tablet; Take 1 tablet  (10 mg total) by mouth daily.  Dispense: 90 tablet; Refill: 1 - metFORMIN (GLUCOPHAGE-XR) 500 MG 24 hr tablet; Take 2 tablets (1,000 mg total) by mouth 2 (two) times daily.  Dispense: 360 tablet; Refill: 0 - HgB A1c - Microalbumin / creatinine urine ratio - Renal Function Panel - Ambulatory referral to Ophthalmology  8. Acquired hypothyroidism Chronic.  Controlled.  Stable.  Will check thyroid function panel and will likely continue Synthroid 100 mcg daily.  Chronic. - levothyroxine (SYNTHROID) 100 MCG tablet; Take 1 tablet (100 mcg total) by mouth daily.  Dispense: 90 tablet; Refill: 1 - Thyroid Panel With TSH  9. Overactive bladder Chronic but newly brought to our attention.  Patient has been having increasing frequency.  Patient has had surgery for incontinence in the past.  In the event that there may be a overactive bladder component we will initiate Ditropan XL 5 mg once a day.  Will recheck patient in 6 weeks for recheck. - oxybutynin (DITROPAN XL) 5 MG 24 hr tablet; Take 1 tablet (5 mg total) by mouth at bedtime.  Dispense: 60 tablet; Refill: 1  Otilio Miu, MD

## 2022-04-03 LAB — RENAL FUNCTION PANEL
Albumin: 4.7 g/dL (ref 3.9–4.9)
BUN/Creatinine Ratio: 23 (ref 12–28)
BUN: 23 mg/dL (ref 8–27)
CO2: 21 mmol/L (ref 20–29)
Calcium: 9.8 mg/dL (ref 8.7–10.3)
Chloride: 101 mmol/L (ref 96–106)
Creatinine, Ser: 1 mg/dL (ref 0.57–1.00)
Glucose: 125 mg/dL — ABNORMAL HIGH (ref 70–99)
Phosphorus: 4.1 mg/dL (ref 3.0–4.3)
Potassium: 4.2 mmol/L (ref 3.5–5.2)
Sodium: 138 mmol/L (ref 134–144)
eGFR: 61 mL/min/{1.73_m2} (ref >59)

## 2022-04-03 LAB — THYROID PANEL WITH TSH
Free Thyroxine Index: 1.6 (ref 1.2–4.9)
T3 Uptake Ratio: 25 % (ref 24–39)
T4, Total: 6.5 ug/dL (ref 4.5–12.0)
TSH: 3.5 u[IU]/mL (ref 0.450–4.500)

## 2022-04-03 LAB — MICROALBUMIN / CREATININE URINE RATIO
Creatinine, Urine: 103.2 mg/dL
Microalb/Creat Ratio: 15 mg/g creat (ref 0–29)
Microalbumin, Urine: 15 ug/mL

## 2022-04-03 LAB — HEMOGLOBIN A1C
Est. average glucose Bld gHb Est-mCnc: 160 mg/dL
Hgb A1c MFr Bld: 7.2 % — ABNORMAL HIGH (ref 4.8–5.6)

## 2022-05-07 ENCOUNTER — Other Ambulatory Visit: Payer: Self-pay | Admitting: Family Medicine

## 2022-05-07 DIAGNOSIS — I10 Essential (primary) hypertension: Secondary | ICD-10-CM

## 2022-05-18 ENCOUNTER — Ambulatory Visit (INDEPENDENT_AMBULATORY_CARE_PROVIDER_SITE_OTHER): Payer: Medicare HMO | Admitting: Family Medicine

## 2022-05-18 ENCOUNTER — Encounter: Payer: Self-pay | Admitting: Family Medicine

## 2022-05-18 VITALS — BP 150/88 | HR 76 | Ht 61.0 in | Wt 134.0 lb

## 2022-05-18 DIAGNOSIS — F17210 Nicotine dependence, cigarettes, uncomplicated: Secondary | ICD-10-CM

## 2022-05-18 DIAGNOSIS — I10 Essential (primary) hypertension: Secondary | ICD-10-CM | POA: Diagnosis not present

## 2022-05-18 MED ORDER — LISINOPRIL 10 MG PO TABS: 10.0000 mg | ORAL_TABLET | Freq: Every day | ORAL | 3 refills | Status: DC

## 2022-05-18 NOTE — Progress Notes (Signed)
Date:  05/18/2022   Name:  Mary Nicholson   DOB:  [birth year]   MRN:  [id number]   Chief Complaint: Hypertension  Hypertension This is a chronic problem. The current episode started more than 1 year ago. The problem has been gradually improving since onset. The problem is uncontrolled. Pertinent negatives include no anxiety, blurred vision, chest pain, headaches, orthopnea, palpitations, shortness of breath or sweats. There are no associated agents to hypertension. Past treatments include beta blockers, calcium channel blockers and diuretics. The current treatment provides moderate improvement.    Lab Results  Component Value Date   NA 138 04/02/2022   K 4.2 04/02/2022   CO2 21 04/02/2022   GLUCOSE 125 (H) 04/02/2022   BUN 23 04/02/2022   CREATININE 1.00 04/02/2022   CALCIUM 9.8 04/02/2022   EGFR 61 04/02/2022   GFRNONAA >60 10/31/2021   Lab Results  Component Value Date   CHOL 152 11/20/2021   HDL 54 11/20/2021   LDLCALC 68 11/20/2021   TRIG 181 (H) 11/20/2021   Lab Results  Component Value Date   TSH 3.500 04/02/2022   Lab Results  Component Value Date   HGBA1C 7.2 (H) 04/02/2022   Lab Results  Component Value Date   WBC 7.4 10/31/2021   HGB 14.0 10/31/2021   HCT 42.8 10/31/2021   MCV 91.1 10/31/2021   PLT 359 10/31/2021   Lab Results  Component Value Date   ALT 21 07/22/2020   AST 16 07/22/2020   ALKPHOS 103 07/22/2020   BILITOT 0.3 07/22/2020   No results found for: "25OHVITD2", "25OHVITD3", "VD25OH"   Review of Systems  Eyes:  Negative for blurred vision.  Respiratory:  Negative for shortness of breath.   Cardiovascular:  Negative for chest pain, palpitations and orthopnea.  Neurological:  Negative for headaches.    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   Chronic radicular lumbar  pain 05/04/2021   History of lumbar fusion 05/04/2021   Chronic pain syndrome 05/04/2021   Bipolar affective disorder, mixed ([place]) 11/26/2019   COPD (chronic obstructive pulmonary disease) ([place]) 11/26/2019   PTSD (post-traumatic stress disorder) 11/26/2019   Schizophrenia ([place]) 11/26/2019   Abdominal wall cellulitis 11/12/2019   Abdominal wall abscess    Type 2 diabetes mellitus with diabetic neuropathy, without long-term current use of insulin (HCC)    Hyperlipidemia    Bilateral hand numbness 09/16/2019   S/P hysterectomy 09/09/2019   Stage 3b chronic kidney disease ([place]) 09/09/2019   Cervical radiculitis 08/13/2019   Chronic bilateral low back pain with right-sided sciatica 08/13/2019   Lumbar radiculopathy 08/13/2019   Neck pain 08/13/2019   Numbness and tingling of both feet 07/30/2019   Dupuytren contracture 05/22/2019   Personal history of transient ischemic attack (TIA), and cerebral infarction without residual deficits 05/22/2019   Resting tremor 05/22/2019   Diverticulosis 05/20/2019   External hemorrhoid 05/20/2019   Abnormal cervical Papanicolaou smear 12/19/2018   Arthralgia of both hands 12/19/2018   Breast pain, right 12/19/2018   Diarrhea 12/19/2018   Dysphagia 12/19/2018   Paresthesias 12/19/2018   Seasonal allergies 12/19/2018   Gastroesophageal reflux disease 09/23/2015   Spinal stenosis of lumbar region 05/04/2015   Allergic rhinitis 11/28/2014   Acquired hypothyroidism 09/13/2014   Essential hypertension 09/13/2014   Type 2 diabetes mellitus with diabetic  neuropathy ([place]) 09/13/2014    No Known Allergies  Past Surgical History:  Procedure Laterality Date   ABDOMINAL HYSTERECTOMY     BACK SURGERY     BREAST BIOPSY Right    negative 1988   BREAST EXCISIONAL BIOPSY Right 1988   CHOLECYSTECTOMY     ESOPHAGOGASTRODUODENOSCOPY (EGD) WITH PROPOFOL N/A 08/08/2021   Procedure: ESOPHAGOGASTRODUODENOSCOPY (EGD) WITH PROPOFOL;  Surgeon: Lucilla Lame, MD;   Location: [facility] ENDOSCOPY;  Service: Endoscopy;  Laterality: N/A;   THORACIC LAMINECTOMY FOR SPINAL CORD STIMULATOR N/A 11/08/2021   Procedure: THORACIC LAMINECTOMY FOR SPINAL CORD STIMULATOR IMPLANT (MEDTRONIC);  Surgeon: Meade Maw, MD;  Location: [facility] ORS;  Service: Neurosurgery;  Laterality: N/A;   TUBAL LIGATION      Social History   Tobacco Use   Smoking status: Every Day    Packs/day: 1.00    Years: 57.00    Total pack years: 57.00    Types: Cigarettes   Smokeless tobacco: Never   Tobacco comments:    Has cut back to 1/2 PPD  Vaping Use   Vaping Use: Never used  Substance Use Topics   Alcohol use: Yes    Comment: Occasional   Drug use: Never     Medication list has been reviewed and updated.  Current Meds  Medication Sig   Acetaminophen (TYLENOL ARTHRITIS EXT RELIEF PO) Take 1 capsule by mouth 3 (three) times daily.   albuterol (VENTOLIN HFA) 108 (90 Base) MCG/ACT inhaler INHALE 1-2 PUFFS BY MOUTH EVERY 6 HOURS AS NEEDED FOR WHEEZE OR SHORTNESS OF BREATH   amLODipine (NORVASC) 10 MG tablet Take 1 tablet (10 mg total) by mouth daily.   aspirin 81 MG chewable tablet Chew by mouth.   atorvastatin (LIPITOR) 80 MG tablet Take 1 tablet (80 mg total) by mouth daily.   b complex vitamins capsule Take 1 capsule by mouth daily.   Biotin 5 MG CAPS Take 1 capsule by mouth daily.   busPIRone (BUSPAR) 15 MG tablet Take 15 mg by mouth 3 (three) times daily.   carboxymethylcellulose (REFRESH PLUS) 0.5 % SOLN Place 1 drop into both eyes daily. 1 drop in left eye BiD and 1 drop to right eye daily   famotidine (PEPCID) 40 MG tablet Take 1 tablet (40 mg total) by mouth daily.   fluticasone (FLONASE) 50 MCG/ACT nasal spray SPRAY 2 SPRAYS INTO EACH NOSTRIL EVERY DAY   Fluticasone-Umeclidin-Vilant (TRELEGY ELLIPTA) 100-62.5-25 MCG/ACT AEPB INHALE 1 PUFF BY MOUTH EVERY DAY   gabapentin (NEURONTIN) 300 MG capsule Take 1 capsule by mouth 3 (three) times daily.   glipiZIDE (GLUCOTROL  XL) 10 MG 24 hr tablet Take 1 tablet (10 mg total) by mouth daily.   hydrochlorothiazide (HYDRODIURIL) 25 MG tablet Take 1 tablet (25 mg total) by mouth daily.   hydrOXYzine (ATARAX) 25 MG tablet Take 25 mg by mouth 2 (two) times daily.   lamoTRIgine (LAMICTAL) 200 MG tablet Take 200 mg by mouth daily. cbc   levothyroxine (SYNTHROID) 100 MCG tablet Take 1 tablet (100 mcg total) by mouth daily.   metFORMIN (GLUCOPHAGE-XR) 500 MG 24 hr tablet Take 2 tablets (1,000 mg total) by mouth 2 (two) times daily.   metoprolol succinate (TOPROL-XL) 50 MG 24 hr tablet Take 1 tablet (50 mg total) by mouth daily.   Multiple Vitamins-Minerals (CENTRUM SILVER PO) Take 1 each by mouth daily.   OneTouch Delica Lancets 48N MISC USE 1 EACH ONCE DAILY USE AS INSTRUCTED.   ONETOUCH ULTRA test strip 3 (three) times daily.  oxybutynin (DITROPAN XL) 5 MG 24 hr tablet Take 1 tablet (5 mg total) by mouth at bedtime.   traZODone (DESYREL) 50 MG tablet Take 50 mg by mouth at bedtime as needed.   venlafaxine XR (EFFEXOR-XR) 75 MG 24 hr capsule Take 150 mg by mouth in the morning and at bedtime.        05/18/2022    1:47 PM 04/02/2022    3:01 PM 11/20/2021    9:58 AM 08/22/2021    1:40 PM  GAD 7 : Generalized Anxiety Score  Nervous, Anxious, on Edge 3 0 0 2  Control/stop worrying 3 0 0 2  Worry too much - different things 3 0 0 1  Trouble relaxing 3 0 0 0  Restless 3 0 1 0  Easily annoyed or irritable 1 0 0 0  Afraid - awful might happen 3 0 0 0  Total GAD 7 Score 19 0 1 5  Anxiety Difficulty Very difficult Not difficult at all Not difficult at all Not difficult at all       05/18/2022    1:46 PM 04/02/2022    3:01 PM 11/20/2021    9:57 AM  Depression screen PHQ 2/9  Decreased Interest 3 0 0  Down, Depressed, Hopeless 3 0 1  PHQ - 2 Score 6 0 1  Altered sleeping 1 0 0  Tired, decreased energy 1 0 0  Change in appetite 0 0 0  Feeling bad or failure about yourself  2 0 1  Trouble concentrating 1 0 0  Moving  slowly or fidgety/restless 2 0 0  Suicidal thoughts 0 0 0  PHQ-9 Score 13 0 2  Difficult doing work/chores Somewhat difficult Not difficult at all Somewhat difficult    BP Readings from Last 3 Encounters:  05/18/22 (!) 150/88  04/02/22 (!) 150/84  01/25/22 (!) 171/86    Physical Exam  Wt Readings from Last 3 Encounters:  05/18/22 134 lb (60.8 kg)  04/02/22 136 lb (61.7 kg)  01/25/22 132 lb (59.9 kg)    BP (!) 150/88   Pulse 76   Ht '5\' 1"'$  (1.549 m)   Wt 134 lb (60.8 kg)   SpO2 98%   BMI 25.32 kg/m   Assessment and Plan:  1. Essential hypertension Chronic.  Uncontrolled.  Stable.  Current blood pressure 150/88.  Asymptomatic.  At this point in time we will going to escalate to either an ACE inhibitor or ARB so we will start lisinopril 10 mg once a day and patient will continue her amlodipine and hydrochlorothiazide.  We will recheck in 4 weeks. - lisinopril (ZESTRIL) 10 MG tablet; Take 1 tablet (10 mg total) by mouth daily.  Dispense: 90 tablet; Refill: 3  2. Cigarette nicotine dependence without complication Patient has been advised of the health risks of smoking and counseled concerning cessation of tobacco products. I spent over 3 minutes for discussion and to answer questions.    Patient's PHQ and GAD is significantly elevated from her baseline of a 0 but she has had a recent argument with her daughter and I think this is kind of escalated the scores a bit patient says she has a therapist in [place] that she is talking to and I have encouraged her to resume that and if still remains elevated our next step will be to initiate sertraline or perhaps Wellbutrin to see if she can quit smoking with it. Otilio Miu, MD

## 2022-05-24 ENCOUNTER — Other Ambulatory Visit: Payer: Self-pay | Admitting: Family Medicine

## 2022-05-24 DIAGNOSIS — I10 Essential (primary) hypertension: Secondary | ICD-10-CM

## 2022-05-24 NOTE — Telephone Encounter (Signed)
Requested Prescriptions  Pending Prescriptions Disp Refills   amLODipine (NORVASC) 10 MG tablet [Pharmacy Med Name: AMLODIPINE BESYLATE 10 MG TAB] 90 tablet 0    Sig: TAKE 1 TABLET BY MOUTH EVERY DAY     Cardiovascular: Calcium Channel Blockers 2 Failed - 05/24/2022  2:33 PM      Failed - Last BP in normal range    BP Readings from Last 1 Encounters:  05/18/22 (!) 150/88         Passed - Last Heart Rate in normal range    Pulse Readings from Last 1 Encounters:  05/18/22 76         Passed - Valid encounter within last 6 months    Recent Outpatient Visits           6 days ago Essential hypertension   [place] [facility] [place], [place], MD   1 month ago Essential hypertension   [place] [facility] [place], [place], MD   6 months ago Essential hypertension   [place] [facility] [place], Deanna C, MD   9 months ago Essential hypertension   [place] [facility] [place], [place], MD   9 months ago Syncope, unspecified syncope type   [place] [facility] [place], [place], MD       Future Appointments             In 3 weeks Juline Patch, MD [place] at [facility], [place]   In 2 months Juline Patch, MD [place] at [facility], [facility]

## 2022-06-15 ENCOUNTER — Ambulatory Visit: Payer: Medicare HMO | Admitting: Family Medicine

## 2022-06-22 ENCOUNTER — Encounter: Payer: Self-pay | Admitting: Family Medicine

## 2022-06-22 ENCOUNTER — Ambulatory Visit (INDEPENDENT_AMBULATORY_CARE_PROVIDER_SITE_OTHER): Payer: Medicare HMO | Admitting: Family Medicine

## 2022-06-22 VITALS — BP 128/68 | HR 81 | Ht 61.0 in | Wt 132.0 lb

## 2022-06-22 DIAGNOSIS — I10 Essential (primary) hypertension: Secondary | ICD-10-CM

## 2022-06-22 DIAGNOSIS — H903 Sensorineural hearing loss, bilateral: Secondary | ICD-10-CM | POA: Diagnosis not present

## 2022-06-22 DIAGNOSIS — J3489 Other specified disorders of nose and nasal sinuses: Secondary | ICD-10-CM | POA: Diagnosis not present

## 2022-06-22 MED ORDER — LISINOPRIL 10 MG PO TABS: 10.0000 mg | ORAL_TABLET | Freq: Every day | ORAL | 3 refills | Status: DC

## 2022-06-22 MED ORDER — HYDROCHLOROTHIAZIDE 25 MG PO TABS: 25.0000 mg | ORAL_TABLET | Freq: Every day | ORAL | 1 refills | Status: DC

## 2022-06-22 MED ORDER — AMLODIPINE BESYLATE 10 MG PO TABS: 10.0000 mg | ORAL_TABLET | Freq: Every day | ORAL | 1 refills | Status: DC

## 2022-06-22 NOTE — Progress Notes (Signed)
Date:  06/22/2022   Name:  Mary Nicholson   DOB:  [birth year]   MRN:  [id number]   Chief Complaint: Hypertension  Hypertension This is a chronic problem. The current episode started more than 1 year ago. The problem has been gradually improving since onset. The problem is controlled. Pertinent negatives include no chest pain, headaches, orthopnea, palpitations, PND or shortness of breath. There are no associated agents to hypertension. There are no known risk factors for coronary artery disease.    Lab Results  Component Value Date   NA 138 04/02/2022   K 4.2 04/02/2022   CO2 21 04/02/2022   GLUCOSE 125 (H) 04/02/2022   BUN 23 04/02/2022   CREATININE 1.00 04/02/2022   CALCIUM 9.8 04/02/2022   EGFR 61 04/02/2022   GFRNONAA >60 10/31/2021   Lab Results  Component Value Date   CHOL 152 11/20/2021   HDL 54 11/20/2021   LDLCALC 68 11/20/2021   TRIG 181 (H) 11/20/2021   Lab Results  Component Value Date   TSH 3.500 04/02/2022   Lab Results  Component Value Date   HGBA1C 7.2 (H) 04/02/2022   Lab Results  Component Value Date   WBC 7.4 10/31/2021   HGB 14.0 10/31/2021   HCT 42.8 10/31/2021   MCV 91.1 10/31/2021   PLT 359 10/31/2021   Lab Results  Component Value Date   ALT 21 07/22/2020   AST 16 07/22/2020   ALKPHOS 103 07/22/2020   BILITOT 0.3 07/22/2020   No results found for: "25OHVITD2", "25OHVITD3", "VD25OH"   Review of Systems  Respiratory:  Negative for chest tightness, shortness of breath and wheezing.   Cardiovascular:  Negative for chest pain, palpitations, orthopnea, leg swelling and PND.  Neurological:  Negative for headaches.    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   Chronic radicular lumbar pain 05/04/2021   History of lumbar fusion 05/04/2021   Chronic pain syndrome 05/04/2021    Bipolar affective disorder, mixed ([place]) 11/26/2019   COPD (chronic obstructive pulmonary disease) ([place]) 11/26/2019   PTSD (post-traumatic stress disorder) 11/26/2019   Schizophrenia ([place]) 11/26/2019   Abdominal wall cellulitis 11/12/2019   Abdominal wall abscess    Type 2 diabetes mellitus with diabetic neuropathy, without long-term current use of insulin (HCC)    Hyperlipidemia    Bilateral hand numbness 09/16/2019   S/P hysterectomy 09/09/2019   Stage 3b chronic kidney disease ([place]) 09/09/2019   Cervical radiculitis 08/13/2019   Chronic bilateral low back pain with right-sided sciatica 08/13/2019   Lumbar radiculopathy 08/13/2019   Neck pain 08/13/2019   Numbness and tingling of both feet 07/30/2019   Dupuytren contracture 05/22/2019   Personal history of transient ischemic attack (TIA), and cerebral infarction without residual deficits 05/22/2019   Resting tremor 05/22/2019   Diverticulosis 05/20/2019   External hemorrhoid 05/20/2019   Abnormal cervical Papanicolaou smear 12/19/2018   Arthralgia of both hands 12/19/2018   Breast pain, right 12/19/2018   Diarrhea 12/19/2018   Dysphagia 12/19/2018   Paresthesias 12/19/2018   Seasonal allergies 12/19/2018   Gastroesophageal reflux disease 09/23/2015   Spinal stenosis of lumbar region 05/04/2015   Allergic rhinitis 11/28/2014   Acquired hypothyroidism 09/13/2014   Essential hypertension 09/13/2014   Type 2 diabetes mellitus with diabetic neuropathy ([place]) 09/13/2014    No Known Allergies  Past Surgical History:  Procedure Laterality Date   ABDOMINAL HYSTERECTOMY     BACK SURGERY     BREAST BIOPSY Right    negative 1988   BREAST EXCISIONAL BIOPSY Right 1988   CHOLECYSTECTOMY     ESOPHAGOGASTRODUODENOSCOPY (EGD) WITH PROPOFOL N/A 08/08/2021   Procedure: ESOPHAGOGASTRODUODENOSCOPY (EGD) WITH PROPOFOL;  Surgeon: Lucilla Lame, MD;  Location: [facility] END[facility]COPY;  Service: Endoscopy;  Laterality: N/A;   THORACIC LAMINECTOMY FOR  SPINAL CORD STIMULATOR N/A 11/08/2021   Procedure: THORACIC LAMINECTOMY FOR SPINAL CORD STIMULATOR IMPLANT (MEDTRONIC);  Surgeon: Meade Maw, MD;  Location: [facility] ORS;  Service: Neurosurgery;  Laterality: N/A;   TUBAL LIGATION      Social History   Tobacco Use   Smoking status: Every Day    Packs/day: 1.00    Years: 57.00    Total pack years: 57.00    Types: Cigarettes   Smokeless tobacco: Never   Tobacco comments:    Has cut back to 1/2 PPD  Vaping Use   Vaping Use: Never used  Substance Use Topics   Alcohol use: Yes    Comment: Occasional   Drug use: Never     Medication list has been reviewed and updated.  Current Meds  Medication Sig   Acetaminophen (TYLENOL ARTHRITIS EXT RELIEF PO) Take 1 capsule by mouth 3 (three) times daily.   albuterol (VENTOLIN HFA) 108 (90 Base) MCG/ACT inhaler INHALE 1-2 PUFFS BY MOUTH EVERY 6 HOURS AS NEEDED FOR WHEEZE OR SHORTNESS OF BREATH   amLODipine (NORVASC) 10 MG tablet TAKE 1 TABLET BY MOUTH EVERY DAY   aspirin 81 MG chewable tablet Chew by mouth.   atorvastatin (LIPITOR) 80 MG tablet Take 1 tablet (80 mg total) by mouth daily.   b complex vitamins capsule Take 1 capsule by mouth daily.   Biotin 5 MG CAPS Take 1 capsule by mouth daily.   busPIRone (BUSPAR) 15 MG tablet Take 15 mg by mouth 3 (three) times daily.   carboxymethylcellulose (REFRESH PLUS) 0.5 % SOLN Place 1 drop into both eyes daily. 1 drop in left eye BiD and 1 drop to right eye daily   famotidine (PEPCID) 40 MG tablet Take 1 tablet (40 mg total) by mouth daily.   fluticasone (FLONASE) 50 MCG/ACT nasal spray SPRAY 2 SPRAYS INTO EACH NOSTRIL EVERY DAY   Fluticasone-Umeclidin-Vilant (TRELEGY ELLIPTA) 100-62.5-25 MCG/ACT AEPB INHALE 1 PUFF BY MOUTH EVERY DAY   gabapentin (NEURONTIN) 300 MG capsule Take 1 capsule by mouth 3 (three) times daily.   glipiZIDE (GLUCOTROL XL) 10 MG 24 hr tablet Take 1 tablet (10 mg total) by mouth daily.   hydrochlorothiazide (HYDRODIURIL) 25  MG tablet Take 1 tablet (25 mg total) by mouth daily.   hydrOXYzine (ATARAX) 25 MG tablet Take 25 mg by mouth 2 (two) times daily.   lamoTRIgine (LAMICTAL) 200 MG tablet Take 200 mg by mouth daily. cbc   levothyroxine (SYNTHROID) 100 MCG tablet Take 1 tablet (100 mcg total) by mouth daily.   lisinopril (ZESTRIL) 10 MG tablet Take 1 tablet (10 mg total) by mouth daily.   metFORMIN (GLUCOPHAGE-XR) 500 MG 24 hr tablet Take 2 tablets (1,000 mg total) by mouth 2 (two) times daily.   Multiple Vitamins-Minerals (CENTRUM SILVER PO) Take 1 each by mouth daily.   OneTouch Delica Lancets 99991111 MISC USE 1 EACH ONCE DAILY USE AS INSTRUCTED.   ONETOUCH ULTRA test strip 3 (three) times daily.   oxybutynin (DITROPAN XL) 5 MG 24 hr tablet Take 1 tablet (5 mg  total) by mouth at bedtime.   traZODone (DESYREL) 50 MG tablet Take 50 mg by mouth at bedtime as needed.   venlafaxine XR (EFFEXOR-XR) 75 MG 24 hr capsule Take 150 mg by mouth in the morning and at bedtime.        06/22/2022    2:49 PM 05/18/2022    1:47 PM 04/02/2022    3:01 PM 11/20/2021    9:58 AM  GAD 7 : Generalized Anxiety Score  Nervous, Anxious, on Edge 3 3 0 0  Control/stop worrying 3 3 0 0  Worry too much - different things 3 3 0 0  Trouble relaxing 3 3 0 0  Restless 3 3 0 1  Easily annoyed or irritable 3 1 0 0  Afraid - awful might happen 3 3 0 0  Total GAD 7 Score 21 19 0 1  Anxiety Difficulty Extremely difficult Very difficult Not difficult at all Not difficult at all       06/22/2022    2:48 PM 05/18/2022    1:46 PM 04/02/2022    3:01 PM  Depression screen PHQ 2/9  Decreased Interest 3 3 0  Down, Depressed, Hopeless 3 3 0  PHQ - 2 Score 6 6 0  Altered sleeping 2 1 0  Tired, decreased energy 2 1 0  Change in appetite 0 0 0  Feeling bad or failure about yourself  2 2 0  Trouble concentrating 2 1 0  Moving slowly or fidgety/restless 2 2 0  Suicidal thoughts 0 0 0  PHQ-9 Score 16 13 0  Difficult doing work/chores Very difficult  Somewhat difficult Not difficult at all    BP Readings from Last 3 Encounters:  06/22/22 130/68  05/18/22 (!) 150/88  04/02/22 (!) 150/84    Physical Exam Vitals and nursing note reviewed.  HENT:     Right Ear: Tympanic membrane normal.     Left Ear: Tympanic membrane normal.     Nose: Nose normal.     Mouth/Throat:     Mouth: Mucous membranes are moist.  Cardiovascular:     Heart sounds: Normal heart sounds, S1 normal and S2 normal. No murmur heard.    No systolic murmur is present.     No diastolic murmur is present.     No friction rub. No gallop. No S3 or S4 sounds.  Pulmonary:     Breath sounds: No wheezing, rhonchi or rales.  Abdominal:     Tenderness: There is no abdominal tenderness.     Wt Readings from Last 3 Encounters:  06/22/22 132 lb (59.9 kg)  05/18/22 134 lb (60.8 kg)  04/02/22 136 lb (61.7 kg)    BP 130/68   Pulse 81   Ht '5\' 1"'$  (1.549 m)   Wt 132 lb (59.9 kg)   SpO2 97%   BMI 24.94 kg/m   Assessment and Plan:  1. Essential hypertension Chronic.  Controlled.  Stable.  Blood pressure 128/68.  Continue amlodipine 10 mg once a day, hydrochlorothiazide 25 mg once a day, and lisinopril 10 mg once a day.  Will recheck patient in 6 months. - amLODipine (NORVASC) 10 MG tablet; Take 1 tablet (10 mg total) by mouth daily.  Dispense: 90 tablet; Refill: 1 - hydrochlorothiazide (HYDRODIURIL) 25 MG tablet; Take 1 tablet (25 mg total) by mouth daily.  Dispense: 90 tablet; Refill: 1 - lisinopril (ZESTRIL) 10 MG tablet; Take 1 tablet (10 mg total) by mouth daily.  Dispense: 90 tablet; Refill: 3  Otilio Miu, MD

## 2022-06-27 ENCOUNTER — Other Ambulatory Visit: Payer: Self-pay | Admitting: Family Medicine

## 2022-06-27 DIAGNOSIS — N3281 Overactive bladder: Secondary | ICD-10-CM

## 2022-06-28 ENCOUNTER — Other Ambulatory Visit: Payer: Self-pay | Admitting: Family Medicine

## 2022-06-28 DIAGNOSIS — E1140 Type 2 diabetes mellitus with diabetic neuropathy, unspecified: Secondary | ICD-10-CM

## 2022-06-28 NOTE — Telephone Encounter (Signed)
Requested by interface surescripts. Future visit in 1 month.  Requested Prescriptions  Pending Prescriptions Disp Refills   metFORMIN (GLUCOPHAGE-XR) 500 MG 24 hr tablet [Pharmacy Med Name: METFORMIN HCL ER 500 MG TABLET] 360 tablet 0    Sig: TAKE 2 TABLETS BY MOUTH TWICE A DAY     Endocrinology:  Diabetes - Biguanides Failed - 06/28/2022  1:50 AM      Failed - B12 Level in normal range and within 720 days    No results found for: "VITAMINB12"       Failed - CBC within normal limits and completed in the last 12 months    WBC  Date Value Ref Range Status  10/31/2021 7.4 4.0 - 10.5 K/uL Final   RBC  Date Value Ref Range Status  10/31/2021 4.70 3.87 - 5.11 MIL/uL Final   Hemoglobin  Date Value Ref Range Status  10/31/2021 14.0 12.0 - 15.0 g/dL Final   HCT  Date Value Ref Range Status  10/31/2021 42.8 36.0 - 46.0 % Final   MCHC  Date Value Ref Range Status  10/31/2021 32.7 30.0 - 36.0 g/dL Final   [facility]  Date Value Ref Range Status  10/31/2021 29.8 26.0 - 34.0 pg Final   MCV  Date Value Ref Range Status  10/31/2021 91.1 80.0 - 100.0 fL Final   No results found for: "PLTCOUNTKUC", "LABPLAT", "POCPLA" RDW  Date Value Ref Range Status  10/31/2021 14.3 11.5 - 15.5 % Final         Passed - Cr in normal range and within 360 days    Creatinine, Ser  Date Value Ref Range Status  04/02/2022 1.00 0.57 - 1.00 mg/dL Final         Passed - HBA1C is between 0 and 7.9 and within 180 days    Hemoglobin A1C  Date Value Ref Range Status  03/24/2020 7.4  Final   Hgb A1c MFr Bld  Date Value Ref Range Status  04/02/2022 7.2 (H) 4.8 - 5.6 % Final    Comment:             Prediabetes: 5.7 - 6.4          Diabetes: >6.4          Glycemic control for adults with diabetes: <7.0          Passed - eGFR in normal range and within 360 days    GFR calc Af Amer  Date Value Ref Range Status  11/14/2019 >60 >60 mL/min Final    Comment:    Performed at [facility], [address] [place], [place] [address]   GFR, Estimated  Date Value Ref Range Status  10/31/2021 >60 >60 mL/min Final    Comment:    (NOTE) Calculated using the CKD-EPI Creatinine Equation (2021)    eGFR  Date Value Ref Range Status  04/02/2022 61 >59 mL/min/1.73 Final         Passed - Valid encounter within last 6 months    Recent Outpatient Visits           6 days ago Essential hypertension    [facility] [place], Deanna C, MD   1 month ago Essential hypertension   [place] [facility] [place], Deanna C, MD   2 months ago Essential hypertension   [place] [facility], MD   7  months ago Essential hypertension   [place] [facility] [place], [place], MD   10 months ago Essential hypertension   [facility] at [facility], MD       Future Appointments             In 1 month Juline Patch, MD [place] at [facility], [facility]

## 2022-07-27 DIAGNOSIS — H60332 Swimmer's ear, left ear: Secondary | ICD-10-CM | POA: Diagnosis not present

## 2022-08-03 ENCOUNTER — Other Ambulatory Visit: Payer: Self-pay

## 2022-08-03 ENCOUNTER — Encounter: Payer: Self-pay | Admitting: Family Medicine

## 2022-08-03 ENCOUNTER — Ambulatory Visit (INDEPENDENT_AMBULATORY_CARE_PROVIDER_SITE_OTHER): Payer: Medicare HMO | Admitting: Family Medicine

## 2022-08-03 VITALS — BP 128/66 | HR 98 | Ht 61.0 in | Wt 133.0 lb

## 2022-08-03 DIAGNOSIS — F419 Anxiety disorder, unspecified: Secondary | ICD-10-CM

## 2022-08-03 DIAGNOSIS — F3160 Bipolar disorder, current episode mixed, unspecified: Secondary | ICD-10-CM

## 2022-08-03 DIAGNOSIS — E1140 Type 2 diabetes mellitus with diabetic neuropathy, unspecified: Secondary | ICD-10-CM | POA: Diagnosis not present

## 2022-08-03 DIAGNOSIS — F32A Depression, unspecified: Secondary | ICD-10-CM | POA: Diagnosis not present

## 2022-08-03 MED ORDER — LAMOTRIGINE 100 MG PO TABS
100.0000 mg | ORAL_TABLET | Freq: Two times a day (BID) | ORAL | 0 refills | Status: DC
Start: 2022-08-03 — End: 2023-04-19

## 2022-08-03 MED ORDER — GLIPIZIDE ER 10 MG PO TB24: 10.0000 mg | ORAL_TABLET | Freq: Every day | ORAL | 1 refills | Status: DC

## 2022-08-03 MED ORDER — METFORMIN HCL ER 500 MG PO TB24: 1000.0000 mg | ORAL_TABLET | Freq: Two times a day (BID) | ORAL | 0 refills | Status: DC

## 2022-08-03 MED ORDER — BUSPIRONE HCL 15 MG PO TABS: 15.0000 mg | ORAL_TABLET | Freq: Three times a day (TID) | ORAL | 0 refills | Status: DC

## 2022-08-03 MED ORDER — VENLAFAXINE HCL ER 75 MG PO CP24: 150.0000 mg | ORAL_CAPSULE | Freq: Two times a day (BID) | ORAL | 0 refills | Status: DC

## 2022-08-03 NOTE — Progress Notes (Signed)
Date:  08/03/2022   Name:  Mary Nicholson   DOB:  [birth year]   MRN:  [id number]   Chief Complaint: Diabetes and Depression  Diabetes She presents for her follow-up diabetic visit. She has type 2 diabetes mellitus. Her disease course has been stable. There are no hypoglycemic associated symptoms. Pertinent negatives for diabetes include no blurred vision, no chest pain, no fatigue, no polydipsia and no polyuria. There are no hypoglycemic complications. Symptoms are worsening. There are no diabetic complications. There are no known risk factors for coronary artery disease. Current diabetic treatment includes oral agent (dual therapy).  Depression        This is a chronic problem.  The current episode started more than 1 year ago.   The problem occurs intermittently.  The problem has been gradually improving since onset.  Associated symptoms include helplessness, hopelessness, restlessness, decreased interest, appetite change and sad.  Associated symptoms include no decreased concentration, no fatigue, does not have insomnia, not irritable and no suicidal ideas.  Past treatments include SNRIs - Serotonin and norepinephrine reuptake inhibitors (lamictal).  Compliance with treatment is good.  Previous treatment provided moderate relief.   Lab Results  Component Value Date   NA 138 04/02/2022   K 4.2 04/02/2022   CO2 21 04/02/2022   GLUCOSE 125 (H) 04/02/2022   BUN 23 04/02/2022   CREATININE 1.00 04/02/2022   CALCIUM 9.8 04/02/2022   EGFR 61 04/02/2022   GFRNONAA >60 10/31/2021   Lab Results  Component Value Date   CHOL 152 11/20/2021   HDL 54 11/20/2021   LDLCALC 68 11/20/2021   TRIG 181 (H) 11/20/2021   Lab Results  Component Value Date   TSH 3.500 04/02/2022   Lab Results  Component Value Date   HGBA1C 7.2 (H) 04/02/2022   Lab Results  Component Value Date   WBC 7.4 10/31/2021   HGB 14.0 10/31/2021   HCT 42.8 10/31/2021   MCV 91.1 10/31/2021   PLT 359 10/31/2021    Lab Results  Component Value Date   ALT 21 07/22/2020   AST 16 07/22/2020   ALKPHOS 103 07/22/2020   BILITOT 0.3 07/22/2020   No results found for: "25OHVITD2", "25OHVITD3", "VD25OH"   Review of Systems  Constitutional:  Positive for appetite change. Negative for fatigue.  HENT:  Negative for trouble swallowing.   Eyes:  Negative for blurred vision, photophobia and visual disturbance.  Respiratory:  Negative for shortness of breath and wheezing.   Cardiovascular:  Negative for chest pain, palpitations and leg swelling.  Gastrointestinal:  Negative for abdominal distention, abdominal pain and constipation.  Endocrine: Negative for polydipsia and polyuria.  Psychiatric/Behavioral:  Positive for depression. Negative for decreased concentration and suicidal ideas. The patient does not have insomnia.     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   Chronic radicular lumbar pain 05/04/2021   History of lumbar fusion 05/04/2021   Chronic pain syndrome 05/04/2021   Bipolar affective disorder, mixed 11/26/2019   COPD (chronic obstructive pulmonary disease) 11/26/2019   PTSD (post-traumatic stress disorder) 11/26/2019   Schizophrenia 11/26/2019   Abdominal wall cellulitis 11/12/2019   Abdominal wall abscess    Type 2 diabetes mellitus with diabetic neuropathy, without long-term current use of insulin    Hyperlipidemia    Bilateral hand numbness 09/16/2019  S/P hysterectomy 09/09/2019   Stage 3b chronic kidney disease 09/09/2019   Cervical radiculitis 08/13/2019   Chronic bilateral low back pain with right-sided sciatica 08/13/2019   Lumbar radiculopathy 08/13/2019   Neck pain 08/13/2019   Numbness and tingling of both feet 07/30/2019   Dupuytren contracture 05/22/2019   Personal history of transient ischemic attack (TIA), and  cerebral infarction without residual deficits 05/22/2019   Resting tremor 05/22/2019   Diverticulosis 05/20/2019   External hemorrhoid 05/20/2019   Abnormal cervical Papanicolaou smear 12/19/2018   Arthralgia of both hands 12/19/2018   Breast pain, right 12/19/2018   Diarrhea 12/19/2018   Dysphagia 12/19/2018   Paresthesias 12/19/2018   Seasonal allergies 12/19/2018   Gastroesophageal reflux disease 09/23/2015   Spinal stenosis of lumbar region 05/04/2015   Allergic rhinitis 11/28/2014   Acquired hypothyroidism 09/13/2014   Essential hypertension 09/13/2014   Type 2 diabetes mellitus with diabetic neuropathy 09/13/2014    No Known Allergies  Past Surgical History:  Procedure Laterality Date   ABDOMINAL HYSTERECTOMY     BACK SURGERY     BREAST BIOPSY Right    negative 1988   BREAST EXCISIONAL BIOPSY Right 1988   CHOLECYSTECTOMY     ESOPHAGOGASTRODUODENOSCOPY (EGD) WITH PROPOFOL N/A 08/08/2021   Procedure: ESOPHAGOGASTRODUODENOSCOPY (EGD) WITH PROPOFOL;  Surgeon: Midge Minium, MD;  Location: [facility];  Service: Endoscopy;  Laterality: N/A;   THORACIC LAMINECTOMY FOR SPINAL CORD STIMULATOR N/A 11/08/2021   Procedure: THORACIC LAMINECTOMY FOR SPINAL CORD STIMULATOR IMPLANT (MEDTRONIC);  Surgeon: Venetia Night, MD;  Location: [facility] ORS;  Service: Neurosurgery;  Laterality: N/A;   TUBAL LIGATION      Social History   Tobacco Use   Smoking status: Every Day    Packs/day: 1.00    Years: 57.00    Additional pack years: 0.00    Total pack years: 57.00    Types: Cigarettes   Smokeless tobacco: Never   Tobacco comments:    Has cut back to 1/2 PPD  Vaping Use   Vaping Use: Never used  Substance Use Topics   Alcohol use: Yes    Comment: Occasional   Drug use: Never     Medication list has been reviewed and updated.  Current Meds  Medication Sig   Acetaminophen (TYLENOL ARTHRITIS EXT RELIEF PO) Take 1 capsule by mouth 3 (three) times daily.   albuterol  (VENTOLIN HFA) 108 (90 Base) MCG/ACT inhaler INHALE 1-2 PUFFS BY MOUTH EVERY 6 HOURS AS NEEDED FOR WHEEZE OR SHORTNESS OF BREATH   amLODipine (NORVASC) 10 MG tablet Take 1 tablet (10 mg total) by mouth daily.   aspirin 81 MG chewable tablet Chew by mouth.   atorvastatin (LIPITOR) 80 MG tablet Take 1 tablet (80 mg total) by mouth daily.   b complex vitamins capsule Take 1 capsule by mouth daily.   Biotin 5 MG CAPS Take 1 capsule by mouth daily.   busPIRone (BUSPAR) 15 MG tablet Take 15 mg by mouth 3 (three) times daily.   carboxymethylcellulose (REFRESH PLUS) 0.5 % SOLN Place 1 drop into both eyes daily. 1 drop in left eye BiD and 1 drop to right eye daily   famotidine (PEPCID) 40 MG tablet Take 1 tablet (40 mg total) by mouth daily.   fluticasone (FLONASE) 50 MCG/ACT nasal spray SPRAY 2 SPRAYS INTO EACH NOSTRIL EVERY DAY   Fluticasone-Umeclidin-Vilant (TRELEGY ELLIPTA) 100-62.5-25 MCG/ACT AEPB INHALE 1 PUFF BY MOUTH EVERY DAY   gabapentin (NEURONTIN) 300 MG capsule Take 1 capsule by mouth 3 (three)  times daily.   glipiZIDE (GLUCOTROL XL) 10 MG 24 hr tablet Take 1 tablet (10 mg total) by mouth daily.   hydrochlorothiazide (HYDRODIURIL) 25 MG tablet Take 1 tablet (25 mg total) by mouth daily.   hydrOXYzine (ATARAX) 25 MG tablet Take 25 mg by mouth 2 (two) times daily.   levothyroxine (SYNTHROID) 100 MCG tablet Take 1 tablet (100 mcg total) by mouth daily.   lisinopril (ZESTRIL) 10 MG tablet Take 1 tablet (10 mg total) by mouth daily.   metFORMIN (GLUCOPHAGE-XR) 500 MG 24 hr tablet TAKE 2 TABLETS BY MOUTH TWICE A DAY   metoprolol succinate (TOPROL-XL) 50 MG 24 hr tablet Take 50 mg by mouth daily.   Multiple Vitamins-Minerals (CENTRUM SILVER PO) Take 1 each by mouth daily.   neomycin-polymyxin b-dexamethasone (MAXITROL) 3.5-10000-0.1 SUSP SMARTSIG:In Eye(s)   OneTouch Delica Lancets 33G MISC USE 1 EACH ONCE DAILY USE AS INSTRUCTED.   ONETOUCH ULTRA test strip 3 (three) times daily.   oxybutynin  (DITROPAN-XL) 5 MG 24 hr tablet TAKE 1 TABLET BY MOUTH EVERYDAY AT BEDTIME   traZODone (DESYREL) 50 MG tablet Take 50 mg by mouth at bedtime as needed.   venlafaxine XR (EFFEXOR-XR) 75 MG 24 hr capsule Take 150 mg by mouth in the morning and at bedtime.        08/03/2022   10:32 AM 06/22/2022    2:49 PM 05/18/2022    1:47 PM 04/02/2022    3:01 PM  GAD 7 : Generalized Anxiety Score  Nervous, Anxious, on Edge 3 3 3  0  Control/stop worrying 3 3 3  0  Worry too much - different things 3 3 3  0  Trouble relaxing 1 3 3  0  Restless 1 3 3  0  Easily annoyed or irritable 2 3 1  0  Afraid - awful might happen 2 3 3  0  Total GAD 7 Score 15 21 19  0  Anxiety Difficulty Very difficult Extremely difficult Very difficult Not difficult at all       08/03/2022   10:30 AM 06/22/2022    2:48 PM 05/18/2022    1:46 PM  Depression screen PHQ 2/9  Decreased Interest 2 3 3   Down, Depressed, Hopeless 2 3 3   PHQ - 2 Score 4 6 6   Altered sleeping 1 2 1   Tired, decreased energy 1 2 1   Change in appetite 1 0 0  Feeling bad or failure about yourself  2 2 2   Trouble concentrating 0 2 1  Moving slowly or fidgety/restless 0 2 2  Suicidal thoughts 0 0 0  PHQ-9 Score 9 16 13   Difficult doing work/chores Somewhat difficult Very difficult Somewhat difficult    BP Readings from Last 3 Encounters:  08/03/22 128/66  06/22/22 128/68  05/18/22 (!) 150/88    Physical Exam Vitals and nursing note reviewed. Exam conducted with a chaperone present.  Constitutional:      General: She is not irritable.She is not in acute distress.    Appearance: She is not diaphoretic.  HENT:     Head: Normocephalic and atraumatic.     Right Ear: External ear normal.     Left Ear: External ear normal.     Nose: Nose normal. No congestion or rhinorrhea.     Mouth/Throat:     Mouth: Mucous membranes are moist.  Eyes:     General:        Right eye: No discharge.        Left eye: No discharge.  Conjunctiva/sclera: Conjunctivae  normal.     Pupils: Pupils are equal, round, and reactive to light.  Neck:     Thyroid: No thyromegaly.     Vascular: No JVD.  Cardiovascular:     Rate and Rhythm: Normal rate and regular rhythm.     Heart sounds: Normal heart sounds. No murmur heard.    No friction rub. No gallop.  Pulmonary:     Effort: Pulmonary effort is normal.     Breath sounds: Normal breath sounds. No wheezing, rhonchi or rales.  Abdominal:     General: Bowel sounds are normal.     Palpations: Abdomen is soft. There is no mass.     Tenderness: There is no abdominal tenderness. There is no guarding.  Musculoskeletal:        General: Normal range of motion.     Cervical back: Normal range of motion and neck supple.  Lymphadenopathy:     Cervical: No cervical adenopathy.  Skin:    General: Skin is warm and dry.  Neurological:     Mental Status: She is alert.     Deep Tendon Reflexes: Reflexes are normal and symmetric.     Wt Readings from Last 3 Encounters:  08/03/22 133 lb (60.3 kg)  06/22/22 132 lb (59.9 kg)  05/18/22 134 lb (60.8 kg)    BP 128/66   Pulse 98   Ht 5\' 1"  (1.549 m)   Wt 133 lb (60.3 kg)   SpO2 98%   BMI 25.13 kg/m   Assessment and Plan:  1. Type 2 diabetes mellitus with diabetic neuropathy, without long-term current use of insulin Chronic.  Controlled.  Stable.  Continue glipizide XL 10 mg and metformin 500 mg XR 2 tablets once a day.  Will check A1c for current level of control. - glipiZIDE (GLUCOTROL XL) 10 MG 24 hr tablet; Take 1 tablet (10 mg total) by mouth daily.  Dispense: 90 tablet; Refill: 1 - metFORMIN (GLUCOPHAGE-XR) 500 MG 24 hr tablet; Take 2 tablets (1,000 mg total) by mouth 2 (two) times daily.  Dispense: 360 tablet; Refill: 0 - HgB A1c - Hepatic function panel  2. Anxiety and depression Recently patient has lost her outpatient psychiatry and has not had refills on her Lamictal or buspirone.  I will continue to prescribe her venlafaxine Exar 75 mg 2 capsules in  the morning at bedtime - venlafaxine XR (EFFEXOR-XR) 75 MG 24 hr capsule; Take 2 capsules (150 mg total) by mouth in the morning and at bedtime.  Dispense: 120 capsule; Refill: 0  3. Bipolar affective disorder, mixed Previously patient was prescribed Lamictal and buspirone at [facility] which is no longer able to see her because of her insurance.  I think the risk of being off the Lamictal exceeds the time of waiting to resume with another outpatient psychiatry.  So I instructed that we continue her Lamictal at her previous dosing and resume her buspirone as well. - venlafaxine XR (EFFEXOR-XR) 75 MG 24 hr capsule; Take 2 capsules (150 mg total) by mouth in the morning and at bedtime.  Dispense: 120 capsule; Refill: 0    Elizabeth Sauer, MD

## 2022-08-03 NOTE — Addendum Note (Signed)
Addended by: Everitt Amber on: 08/03/2022 01:02 PM   Modules accepted: Orders

## 2022-08-04 LAB — HEPATIC FUNCTION PANEL
ALT: 15 IU/L (ref 0–32)
AST: 13 IU/L (ref 0–40)
Albumin: 4.6 g/dL (ref 3.9–4.9)
Alkaline Phosphatase: 94 IU/L (ref 44–121)
Bilirubin Total: 0.3 mg/dL (ref 0.0–1.2)
Bilirubin, Direct: <0.1 mg/dL (ref 0.00–0.40)
Total Protein: 7 g/dL (ref 6.0–8.5)

## 2022-08-04 LAB — HEMOGLOBIN A1C
Est. average glucose Bld gHb Est-mCnc: 157 mg/dL
Hgb A1c MFr Bld: 7.1 % — ABNORMAL HIGH (ref 4.8–5.6)

## 2022-08-06 ENCOUNTER — Other Ambulatory Visit: Payer: Self-pay

## 2022-08-06 DIAGNOSIS — E1140 Type 2 diabetes mellitus with diabetic neuropathy, unspecified: Secondary | ICD-10-CM

## 2022-08-06 MED ORDER — METFORMIN HCL ER 750 MG PO TB24
750.0000 mg | ORAL_TABLET | Freq: Every day | ORAL | 0 refills | Status: DC
Start: 2022-08-06 — End: 2022-08-09

## 2022-08-08 ENCOUNTER — Other Ambulatory Visit: Payer: Self-pay | Admitting: Family Medicine

## 2022-08-08 ENCOUNTER — Other Ambulatory Visit: Payer: Self-pay | Admitting: Neurosurgery

## 2022-08-08 DIAGNOSIS — F3160 Bipolar disorder, current episode mixed, unspecified: Secondary | ICD-10-CM

## 2022-08-08 DIAGNOSIS — J301 Allergic rhinitis due to pollen: Secondary | ICD-10-CM

## 2022-08-08 NOTE — Telephone Encounter (Signed)
She had SCS placed and was released after her visit with Myer Haff in October.   Prescription refill for robaxin. Recommend she get this from her PCP as she is not seeing [place].  Please let her know. I denied the refill.

## 2022-08-08 NOTE — Telephone Encounter (Signed)
She said that her last refill came from Dr.Shah, she will ask him for the refill.

## 2022-08-09 ENCOUNTER — Ambulatory Visit: Payer: Self-pay | Admitting: *Deleted

## 2022-08-09 ENCOUNTER — Telehealth: Payer: Self-pay

## 2022-08-09 ENCOUNTER — Other Ambulatory Visit: Payer: Self-pay

## 2022-08-09 DIAGNOSIS — E1140 Type 2 diabetes mellitus with diabetic neuropathy, unspecified: Secondary | ICD-10-CM

## 2022-08-09 MED ORDER — METFORMIN HCL 1000 MG PO TABS
1000.0000 mg | ORAL_TABLET | Freq: Two times a day (BID) | ORAL | 1 refills | Status: DC
Start: 2022-08-09 — End: 2022-12-14

## 2022-08-09 NOTE — Telephone Encounter (Signed)
Pt is calling back to let Delice Bison know that appt will be virtual 08/21/22

## 2022-08-09 NOTE — Telephone Encounter (Signed)
Summary: Meformin dosage   Question regarding metFORMIN (GLUCOPHAGE-XR) 750 MG 24 hr tablet dosage Patient is currently taking 1000 in the morning and 1000 in the evening, pcp said she was going to increase by 750.  Clarification is needed. Please advise.        No contact with patient from [facility]. Need clarification of metformin dose. Is patient to take metformin 750 mg daily with breakfast or her current dose of 1000 mg twice daily? Please advise. Last OV 08/03/22 noted patient dose metformin 500 mg XR 2 tablets once a day and patient reports she has been taking 500 mg (1,000 mg total) 2 times a day. Please advise     Reason for Disposition  NON-URGENT call redirected to PCP's office because it is open  Answer Assessment - Initial Assessment Questions N/A Patient requesting clarification of medication dose for metformin. Is she supposed to take 750 mg daily with breakfast or what she has been taking 1,000 mg twice daily?  Protocols used: No Contact or Duplicate Contact Call-A-AH

## 2022-08-09 NOTE — Telephone Encounter (Signed)
Called pt and advised her to call [phone] and wait through the full message to speak to someone on staff per [place].

## 2022-08-10 ENCOUNTER — Telehealth: Payer: Self-pay

## 2022-08-10 NOTE — Telephone Encounter (Signed)
Pt called and stated she has appt with [facility] on 4/30

## 2022-08-17 DIAGNOSIS — H903 Sensorineural hearing loss, bilateral: Secondary | ICD-10-CM | POA: Diagnosis not present

## 2022-08-17 DIAGNOSIS — H60332 Swimmer's ear, left ear: Secondary | ICD-10-CM | POA: Diagnosis not present

## 2022-08-21 ENCOUNTER — Emergency Department
Admission: EM | Admit: 2022-08-21 | Discharge: 2022-08-21 | Disposition: A | Discharge status: Home / Self Care | Payer: Medicare HMO | Attending: Emergency Medicine | Admitting: Emergency Medicine

## 2022-08-21 ENCOUNTER — Telehealth: Payer: Self-pay

## 2022-08-21 ENCOUNTER — Telehealth: Payer: Self-pay | Admitting: Family Medicine

## 2022-08-21 ENCOUNTER — Other Ambulatory Visit: Payer: Self-pay

## 2022-08-21 DIAGNOSIS — L02416 Cutaneous abscess of left lower limb: Secondary | ICD-10-CM | POA: Insufficient documentation

## 2022-08-21 DIAGNOSIS — L0291 Cutaneous abscess, unspecified: Secondary | ICD-10-CM

## 2022-08-21 MED ORDER — DOXYCYCLINE MONOHYDRATE 100 MG PO TABS: 100.0000 mg | ORAL_TABLET | Freq: Two times a day (BID) | ORAL | 0 refills | Status: AC

## 2022-08-21 MED ORDER — LIDOCAINE HCL 1 % IJ SOLN
5.0000 mL | Freq: Once | INTRAMUSCULAR | Status: AC
  Administered 2022-08-21: 5 mL
  Filled 2022-08-21: qty 10

## 2022-08-21 MED ORDER — CEPHALEXIN 500 MG PO CAPS: 500.0000 mg | ORAL_CAPSULE | Freq: Four times a day (QID) | ORAL | 0 refills | Status: AC

## 2022-08-21 NOTE — ED Triage Notes (Signed)
Pt reports abscess to left side of vagina for a couple days. Denies drainage or fevers.

## 2022-08-21 NOTE — Telephone Encounter (Signed)
Copied from CRM [id number]. Topic: General - Other >> Aug 21, 2022  2:39 PM Santiya F wrote: Reason for CRM: Pt is requesting that Delice Bison give her a call as soon as she can.

## 2022-08-21 NOTE — ED Provider Notes (Signed)
[facility] Provider Note  Patient Contact: 10:49 PM (approximate)   History   Abscess   HPI  Mary Nicholson is a 67 y.o. female presents to the emergency department with a left inner thigh abscess with 6 cm x 6 cm region of surrounding erythema.  No spontaneous drainage.      Physical Exam   Triage Vital Signs: ED Triage Vitals  Enc Vitals Group     BP 08/21/22 1808 (!) 156/83     Pulse Rate 08/21/22 1808 89     Resp 08/21/22 1808 18     Temp 08/21/22 1808 98.6 F (37 C)     Temp src --      SpO2 08/21/22 1808 96 %     Weight 08/21/22 1807 134 lb (60.8 kg)     Height 08/21/22 1807 5\' 1"  (1.549 m)     Head Circumference --      Peak Flow --      Pain Score 08/21/22 1807 8     Pain Loc --      Pain Edu? --      Excl. in GC? --     Most recent vital signs: Vitals:   08/21/22 1808 08/21/22 2106  BP: (!) 156/83 (!) 156/80  Pulse: 89   Resp: 18   Temp: 98.6 F (37 C)   SpO2: 96% 96%     General: Alert and in no acute distress. Eyes:  PERRL. EOMI. Head: No acute traumatic findings ENT:      Nose: No congestion/rhinnorhea.      Mouth/Throat: Mucous membranes are moist. Neck: No stridor. No cervical spine tenderness to palpation. Cardiovascular:  Good peripheral perfusion Respiratory: Normal respiratory effort without tachypnea or retractions. Lungs CTAB. Good air entry to the bases with no decreased or absent breath sounds. Gastrointestinal: Bowel sounds 4 quadrants. Soft and nontender to palpation. No guarding or rigidity. No palpable masses. No distention. No CVA tenderness. Musculoskeletal: Full range of motion to all extremities.  Neurologic:  No gross focal neurologic deficits are appreciated.  Skin: Patient has a 6 cm x 6 cm region of left inner thigh erythema.   ED Results / Procedures / Treatments   Labs (all labs ordered are listed, but only abnormal results are displayed) Labs Reviewed - No data to  display      PROCEDURES:  Critical Care performed: No  ..Incision and Drainage  Date/Time: 08/21/2022 10:51 PM  Performed by: Orvil Feil, PA-C Authorized by: Orvil Feil, PA-C   Consent:    Consent obtained:  Verbal   Risks discussed:  Bleeding and incomplete drainage Universal protocol:    Procedure explained and questions answered to patient or proxy's satisfaction: yes     Patient identity confirmed:  Verbally with patient Location:    Type:  Abscess Sedation:    Sedation type:  None Anesthesia:    Anesthesia method:  Local infiltration   Local anesthetic:  Lidocaine 1% w/o epi Procedure type:    Complexity:  Simple Procedure details:    Ultrasound guidance: no     Incision types:  Single straight   Wound management:  Probed and deloculated   Drainage:  Purulent   Packing materials:  1/4 in iodoform gauze Post-procedure details:    Procedure completion:  Tolerated well, no immediate complications    MEDICATIONS ORDERED IN ED: Medications  lidocaine (XYLOCAINE) 1 % (with pres) injection 5 mL (5 mLs Infiltration Given 08/21/22 2056)  IMPRESSION / MDM / ASSESSMENT AND PLAN / ED COURSE  I reviewed the triage vital signs and the nursing notes.                              Assessment and plan Abscess 67 year old female presents to the emergency department with a left inner thigh abscess.  Patient underwent incision and drainage without complication and was started on doxycycline and Keflex.  Patient education regarding wound care was given.  Return precautions were given to return with new or worsening symptoms.      FINAL CLINICAL IMPRESSION(S) / ED DIAGNOSES   Final diagnoses:  Abscess     Rx / DC Orders   ED Discharge Orders          Ordered    cephALEXin (KEFLEX) 500 MG capsule  4 times daily        08/21/22 2056    doxycycline (ADOXA) 100 MG tablet  2 times daily        08/21/22 2056             Note:  This document was  prepared using Dragon voice recognition software and may include unintentional dictation errors.   Pia Mau [place], PA-C 08/21/22 2252    Merwyn Katos, MD 08/21/22 [phone]

## 2022-08-21 NOTE — Telephone Encounter (Signed)
Called pt and left vm  

## 2022-08-21 NOTE — Discharge Instructions (Signed)
Take Doxycycline twice daily. Take Keflex four times daily.

## 2022-08-22 ENCOUNTER — Ambulatory Visit: Payer: Medicare HMO | Admitting: Family Medicine

## 2022-08-22 ENCOUNTER — Telehealth: Payer: Self-pay | Admitting: Family Medicine

## 2022-08-22 NOTE — Telephone Encounter (Signed)
Copied from CRM [id number]. Topic: General - Other >> Aug 22, 2022  7:40 AM Everette C wrote: Reason for CRM: The patient's daughter has returned a missed call from T. Lynch   Please contact further when possible

## 2022-08-24 ENCOUNTER — Telehealth: Payer: Self-pay

## 2022-08-24 NOTE — Transitions of Care (Post Inpatient/ED Visit) (Signed)
08/24/2022  Name: Mary Nicholson MRN: [id number] DOB: [birth year]  Today's TOC FU Call Status: Today's TOC FU Call Status:: Successful TOC FU Call Competed TOC FU Call Complete Date: 08/24/22  Transition Care Management Follow-up Telephone Call Date of Discharge: 08/21/22 Discharge Facility: [facility]) Type of Discharge: Emergency Department Reason for ED Visit: Other: How have you been since you were released from the hospital?: Better Any questions or concerns?: Yes Patient Questions/Concerns:: thinks the Gauze is in the skin and the tissue has grown over it" Patient Questions/Concerns Addressed: Other: (advised her to call urgent care and ask whether gauze was in it)  Items Reviewed: Did you receive and understand the discharge instructions provided?: Yes Medications obtained,verified, and reconciled?: Yes (Medications Reviewed) Any new allergies since your discharge?: No Dietary orders reviewed?: NA Do you have support at home?: Yes People in Home: child(ren), adult Name of Support/Comfort Primary Source: Victorino Dike  Medications Reviewed Today: Medications Reviewed Today     Reviewed by Everitt Amber (Medical Assistant) on 08/24/22 at 1142  Med List Status: <None>   Medication Order Taking? Sig Documenting Provider Last Dose Status Informant  Acetaminophen (TYLENOL ARTHRITIS EXT RELIEF PO) [id number] Yes Take 1 capsule by mouth 3 (three) times daily. [provider] Taking Active   albuterol (VENTOLIN HFA) 108 (90 Base) MCG/ACT inhaler [id number] Yes INHALE 1-2 PUFFS BY MOUTH EVERY 6 HOURS AS NEEDED FOR WHEEZE OR SHORTNESS OF Yong Channel, MD Taking Active   amLODipine (NORVASC) 10 MG tablet [id number] Yes Take 1 tablet (10 mg total) by mouth daily. Duanne Limerick, MD Taking Active   aspirin 81 MG chewable tablet [id number] Yes Chew by mouth. [provider] Taking Active Self  atorvastatin (LIPITOR) 80 MG tablet  [id number] Yes Take 1 tablet (80 mg total) by mouth daily. Duanne Limerick, MD Taking Active   b complex vitamins capsule [id number] Yes Take 1 capsule by mouth daily. [provider] Taking Active   Biotin 5 MG CAPS [id number] Yes Take 1 capsule by mouth daily. [provider] Taking Active   busPIRone (BUSPAR) 15 MG tablet [id number] Yes TAKE 1 TABLET BY MOUTH 3 TIMES DAILY. Duanne Limerick, MD Taking Active   carboxymethylcellulose (REFRESH PLUS) 0.5 % SOLN [id number] Yes Place 1 drop into both eyes daily. 1 drop in left eye BiD and 1 drop to right eye daily [provider] Taking Active Self  cephALEXin (KEFLEX) 500 MG capsule [id number] Yes Take 1 capsule (500 mg total) by mouth 4 (four) times daily for 7 days. Pia Mau M, PA-C Taking Active   doxycycline (ADOXA) 100 MG tablet [id number] Yes Take 1 tablet (100 mg total) by mouth 2 (two) times daily for 7 days. Pia Mau M, PA-C Taking Active   famotidine (PEPCID) 40 MG tablet [id number] Yes Take 1 tablet (40 mg total) by mouth daily. Duanne Limerick, MD Taking Active   fluticasone [facility]) 50 MCG/ACT nasal spray [id number] Yes SPRAY 2 SPRAYS INTO EACH NOSTRIL EVERY DAY Duanne Limerick, MD Taking Active   Fluticasone-Umeclidin-Vilant (TRELEGY ELLIPTA) 100-62.5-25 MCG/ACT AEPB [id number] Yes INHALE 1 PUFF BY MOUTH EVERY DAY Duanne Limerick, MD Taking Active   gabapentin (NEURONTIN) 300 MG capsule [id number] Yes Take 1 capsule by mouth 3 (three) times daily. [provider] Taking Active   glipiZIDE (GLUCOTROL XL) 10 MG 24 hr tablet [id number] Yes Take 1 tablet (10 mg total) by mouth daily. Duanne Limerick, MD Taking Active  hydrochlorothiazide (HYDRODIURIL) 25 MG tablet [id number] Yes Take 1 tablet (25 mg total) by mouth daily. Duanne Limerick, MD Taking Active   hydrOXYzine (ATARAX) 25 MG tablet [id number] Yes Take 25 mg by mouth 2 (two) times daily. [provider] Taking Active   lamoTRIgine (LAMICTAL)  100 MG tablet [id number] Yes Take 1 tablet (100 mg total) by mouth 2 (two) times daily. cbc Duanne Limerick, MD Taking Active   levothyroxine (SYNTHROID) 100 MCG tablet [id number] Yes Take 1 tablet (100 mcg total) by mouth daily. Duanne Limerick, MD Taking Active   lisinopril (ZESTRIL) 10 MG tablet [id number] Yes Take 1 tablet (10 mg total) by mouth daily. Duanne Limerick, MD Taking Active   metFORMIN (GLUCOPHAGE) 1000 MG tablet [id number] Yes Take 1 tablet (1,000 mg total) by mouth 2 (two) times daily with a meal. Duanne Limerick, MD Taking Active   metoprolol succinate (TOPROL-XL) 50 MG 24 hr tablet [id number] Yes Take 50 mg by mouth daily. [provider] Taking Active   Multiple Vitamins-Minerals (CENTRUM SILVER PO) [id number] Yes Take 1 each by mouth daily. [provider] Taking Active   neomycin-polymyxin b-dexamethasone (MAXITROL) 3.5-10000-0.1 SUSP [id number] Yes SMARTSIG:In Eye(s) [provider] Taking Active   OneTouch Delica Lancets 33G MISC [id number] Yes USE 1 EACH ONCE DAILY USE AS INSTRUCTED. Duanne Limerick, MD Taking Active   [facility] ULTRA test strip [id number] Yes 3 (three) times daily. [provider] Taking Active   oxybutynin (DITROPAN-XL) 5 MG 24 hr tablet [id number] Yes TAKE 1 TABLET BY MOUTH EVERYDAY AT BEDTIME Duanne Limerick, MD Taking Active   traZODone (DESYREL) 50 MG tablet [id number] Yes Take 50 mg by mouth at bedtime as needed. [provider] Taking Active Self  venlafaxine XR (EFFEXOR-XR) 75 MG 24 hr capsule [id number] Yes Take 2 capsules (150 mg total) by mouth in the morning and at bedtime. Duanne Limerick, MD Taking Active             Home Care and Equipment/Supplies: Were Home Health Services Ordered?: NA Any new equipment or medical supplies ordered?: NA  Functional Questionnaire: Do you need assistance with bathing/showering or dressing?: No Do you need assistance with meal preparation?: No Do you need assistance  with eating?: No Do you have difficulty maintaining continence: No Do you need assistance with getting out of bed/getting out of a chair/moving?: No Do you have difficulty managing or taking your medications?: No  Follow up appointments reviewed: PCP Follow-up appointment confirmed?: NA (pt may have to return to urgent care and she has psych appt on Monday) [facility] Follow-up appointment confirmed?: NA Do you need transportation to your follow-up appointment?: No Do you understand care options if your condition(s) worsen?: Yes-patient verbalized understanding    SIGNATURE Arthur Holms

## 2022-08-25 ENCOUNTER — Emergency Department
Admission: EM | Admit: 2022-08-25 | Discharge: 2022-08-25 | Disposition: A | Discharge status: Home / Self Care | Payer: Medicare HMO | Attending: Emergency Medicine | Admitting: Emergency Medicine

## 2022-08-25 ENCOUNTER — Other Ambulatory Visit: Payer: Self-pay

## 2022-08-25 DIAGNOSIS — I10 Essential (primary) hypertension: Secondary | ICD-10-CM | POA: Diagnosis not present

## 2022-08-25 DIAGNOSIS — E119 Type 2 diabetes mellitus without complications: Secondary | ICD-10-CM | POA: Diagnosis not present

## 2022-08-25 DIAGNOSIS — Z5189 Encounter for other specified aftercare: Secondary | ICD-10-CM

## 2022-08-25 DIAGNOSIS — Z4801 Encounter for change or removal of surgical wound dressing: Secondary | ICD-10-CM | POA: Diagnosis not present

## 2022-08-25 DIAGNOSIS — L0291 Cutaneous abscess, unspecified: Secondary | ICD-10-CM | POA: Diagnosis not present

## 2022-08-25 NOTE — ED Notes (Signed)
Will defer exam to PA-C

## 2022-08-25 NOTE — ED Provider Notes (Signed)
[facility] Provider Note    Event Date/Time   First MD Initiated Contact with Patient 08/25/22 1340     (approximate)   History   Abscess (Patient was seen here on 08/21/2022 for left inner thigh abscess measuring 6 cm x 6 cm and underwent and I&D; She is here today because she says that the "string on the packing broke off and the skin healed over the rest of the packing that's inside")   HPI  Mary Nicholson is a 67 y.o. female with history of diabetes, hyperlipidemia, hypertension, schizophrenia among other chronic medical problems presents emergency department with concerns of there being iodoform gauze still stuck in her wound.  She had a large abscess where they did an incision and drainage.  Does not know when the packing fell out or if it is just underneath the skin.  States the area has gotten somewhat better.  Is still on her antibiotics.  Denies fever or chills.  No recent drainage from the wound      Physical Exam   Triage Vital Signs: ED Triage Vitals [08/25/22 1255]  Enc Vitals Group     BP 130/87     Pulse Rate 95     Resp 17     Temp 98.8 F (37.1 C)     Temp Source Oral     SpO2 96 %     Weight      Height      Head Circumference      Peak Flow      Pain Score      Pain Loc      Pain Edu?      Excl. in GC?     Most recent vital signs: Vitals:   08/25/22 1255  BP: 130/87  Pulse: 95  Resp: 17  Temp: 98.8 F (37.1 C)  SpO2: 96%     General: Awake, no distress.   CV:  Good peripheral perfusion. regular rate and  rhythm Resp:  Normal effort.  Abd:  No distention.   Other: On the left upper inner thigh, there is a hard indurated area surrounding the recent incision and drainage, no pus, no foreign body, neurovascular intact   ED Results / Procedures / Treatments   Labs (all labs ordered are listed, but only abnormal results are displayed) Labs Reviewed - No data to  display   EKG     RADIOLOGY     PROCEDURES:   Procedures   MEDICATIONS ORDERED IN ED: Medications - No data to display   IMPRESSION / MDM / ASSESSMENT AND PLAN / ED COURSE  I reviewed the triage vital signs and the nursing notes.                              Differential diagnosis includes, but is not limited to, foreign body, wound check, cellulitis  Patient's presentation is most consistent with acute, uncomplicated illness.   I did explain to the patient that the area did not appear to be very deep I do not think there is any packing left in the wound I feel that most likely fell out when she was not paying attention.  The area is hard and indurated which is typical of a healing abscess.  She is to apply warm compress.  Follow-up with her regular doctor.  Finish her medication.  Return emergency department worsening.  She is in agreement treatment plan.  Was discharged stable condition.      FINAL CLINICAL IMPRESSION(S) / ED DIAGNOSES   Final diagnoses:  Wound check, abscess     Rx / DC Orders   ED Discharge Orders     None        Note:  This document was prepared using Dragon voice recognition software and may include unintentional dictation errors.    Faythe Ghee, PA-C 08/25/22 1444    Concha Se, MD 08/25/22 [phone]

## 2022-08-25 NOTE — ED Triage Notes (Signed)
Patient was seen here on 08/21/2022 for left inner thigh abscess measuring 6 cm x 6 cm and underwent and I&D; She is here today because she says that the "string on the packing broke off and the skin healed over the rest of the packing that's inside"

## 2022-08-27 DIAGNOSIS — F39 Unspecified mood [affective] disorder: Secondary | ICD-10-CM | POA: Diagnosis not present

## 2022-08-27 DIAGNOSIS — F419 Anxiety disorder, unspecified: Secondary | ICD-10-CM | POA: Diagnosis not present

## 2022-08-27 DIAGNOSIS — G4700 Insomnia, unspecified: Secondary | ICD-10-CM | POA: Diagnosis not present

## 2022-08-27 DIAGNOSIS — F4312 Post-traumatic stress disorder, chronic: Secondary | ICD-10-CM | POA: Diagnosis not present

## 2022-09-18 ENCOUNTER — Ambulatory Visit: Admission: RE | Admit: 2022-09-18 | Payer: Medicare HMO | Source: Ambulatory Visit

## 2022-09-18 ENCOUNTER — Other Ambulatory Visit: Payer: Self-pay

## 2022-09-18 DIAGNOSIS — E1140 Type 2 diabetes mellitus with diabetic neuropathy, unspecified: Secondary | ICD-10-CM

## 2022-09-19 ENCOUNTER — Other Ambulatory Visit: Payer: Self-pay

## 2022-09-19 DIAGNOSIS — E1140 Type 2 diabetes mellitus with diabetic neuropathy, unspecified: Secondary | ICD-10-CM

## 2022-09-19 LAB — HEMOGLOBIN A1C
Est. average glucose Bld gHb Est-mCnc: 160 mg/dL
Hgb A1c MFr Bld: 7.2 % — ABNORMAL HIGH (ref 4.8–5.6)

## 2022-09-19 MED ORDER — PIOGLITAZONE HCL 15 MG PO TABS
15.0000 mg | ORAL_TABLET | Freq: Every day | ORAL | 1 refills | Status: DC
Start: 2022-09-19 — End: 2022-10-15

## 2022-09-26 ENCOUNTER — Ambulatory Visit (INDEPENDENT_AMBULATORY_CARE_PROVIDER_SITE_OTHER): Payer: 59

## 2022-09-26 VITALS — Ht 61.0 in | Wt 133.0 lb

## 2022-09-26 DIAGNOSIS — Z0000 Encounter for general adult medical examination without abnormal findings: Secondary | ICD-10-CM | POA: Diagnosis not present

## 2022-09-26 NOTE — Progress Notes (Signed)
I connected with  Tongela Oum on 09/26/22 by a audio enabled telemedicine application and verified that I am speaking with the correct person using two identifiers.  Patient Location: Home  Provider Location: Office/Clinic  I discussed the limitations of evaluation and management by telemedicine. The patient expressed understanding and agreed to proceed.  Subjective:   Mary Nicholson is a 67 y.o. female who presents for Medicare Annual (Subsequent) preventive examination.  Review of Systems     Cardiac Risk Factors include: advanced age (>62men, >28 women);dyslipidemia;smoking/ tobacco exposure;hypertension     Objective:    Today's Vitals   09/26/22 1521  Weight: 133 lb (60.3 kg)  Height: 5\' 1"  (1.549 m)   Body mass index is 25.13 kg/m.     09/26/2022    3:06 PM 08/25/2022   12:59 PM 08/21/2022    6:08 PM 11/08/2021   12:00 PM 11/08/2021    6:59 AM 10/31/2021    8:27 AM 08/23/2021    1:33 PM  Advanced Directives  Does Patient Have a Medical Advance Directive? No No No No No No Unable to assess, patient is non-responsive or altered mental status  Would patient like information on creating a medical advance directive? No - Patient declined   No - Patient declined No - Patient declined      Current Medications (verified) Outpatient Encounter Medications as of 09/26/2022  Medication Sig   Acetaminophen (TYLENOL ARTHRITIS EXT RELIEF PO) Take 1 capsule by mouth 3 (three) times daily.   albuterol (VENTOLIN HFA) 108 (90 Base) MCG/ACT inhaler INHALE 1-2 PUFFS BY MOUTH EVERY 6 HOURS AS NEEDED FOR WHEEZE OR SHORTNESS OF BREATH   amLODipine (NORVASC) 10 MG tablet Take 1 tablet (10 mg total) by mouth daily.   aspirin 81 MG chewable tablet Chew by mouth.   atorvastatin (LIPITOR) 80 MG tablet Take 1 tablet (80 mg total) by mouth daily.   b complex vitamins capsule Take 1 capsule by mouth daily.   Biotin 5 MG CAPS Take 1 capsule by mouth daily.   busPIRone (BUSPAR) 15 MG  tablet TAKE 1 TABLET BY MOUTH 3 TIMES DAILY.   carboxymethylcellulose (REFRESH PLUS) 0.5 % SOLN Place 1 drop into both eyes daily. 1 drop in left eye BiD and 1 drop to right eye daily   famotidine (PEPCID) 40 MG tablet Take 1 tablet (40 mg total) by mouth daily.   fluticasone (FLONASE) 50 MCG/ACT nasal spray SPRAY 2 SPRAYS INTO EACH NOSTRIL EVERY DAY   Fluticasone-Umeclidin-Vilant (TRELEGY ELLIPTA) 100-62.5-25 MCG/ACT AEPB INHALE 1 PUFF BY MOUTH EVERY DAY   gabapentin (NEURONTIN) 300 MG capsule Take 1 capsule by mouth 3 (three) times daily.   glipiZIDE (GLUCOTROL XL) 10 MG 24 hr tablet Take 1 tablet (10 mg total) by mouth daily.   hydrochlorothiazide (HYDRODIURIL) 25 MG tablet Take 1 tablet (25 mg total) by mouth daily.   hydrOXYzine (ATARAX) 25 MG tablet Take 25 mg by mouth 2 (two) times daily.   lamoTRIgine (LAMICTAL) 100 MG tablet Take 1 tablet (100 mg total) by mouth 2 (two) times daily. cbc   levothyroxine (SYNTHROID) 100 MCG tablet Take 1 tablet (100 mcg total) by mouth daily.   lisinopril (ZESTRIL) 10 MG tablet Take 1 tablet (10 mg total) by mouth daily.   metFORMIN (GLUCOPHAGE) 1000 MG tablet Take 1 tablet (1,000 mg total) by mouth 2 (two) times daily with a meal.   metoprolol succinate (TOPROL-XL) 50 MG 24 hr tablet Take 50 mg by mouth daily.  Multiple Vitamins-Minerals (CENTRUM SILVER PO) Take 1 each by mouth daily.   OneTouch Delica Lancets 33G MISC USE 1 EACH ONCE DAILY USE AS INSTRUCTED.   ONETOUCH ULTRA test strip 3 (three) times daily.   oxybutynin (DITROPAN-XL) 5 MG 24 hr tablet TAKE 1 TABLET BY MOUTH EVERYDAY AT BEDTIME   pioglitazone (ACTOS) 15 MG tablet Take 1 tablet (15 mg total) by mouth daily.   traZODone (DESYREL) 50 MG tablet Take 50 mg by mouth at bedtime as needed.   venlafaxine XR (EFFEXOR-XR) 75 MG 24 hr capsule Take 2 capsules (150 mg total) by mouth in the morning and at bedtime.   neomycin-polymyxin b-dexamethasone (MAXITROL) 3.5-10000-0.1 SUSP SMARTSIG:In Eye(s)  (Patient not taking: Reported on 09/26/2022)   No facility-administered encounter medications on file as of 09/26/2022.    Allergies (verified) Patient has no known allergies.   History: Past Medical History:  Diagnosis Date   Anemia    Arthritis    Bipolar 1 disorder (HCC)    Cancer (HCC)    cervical   Chronic kidney disease    COPD (chronic obstructive pulmonary disease) (HCC)    Diabetes mellitus without complication (HCC)    Dyspnea    GERD (gastroesophageal reflux disease)    History of kidney stones    Hx of right breast biopsy 1988   RIGHT excisional bx 1988   Hyperlipidemia    Hypertension    Hypothyroidism    Pneumonia    PTSD (post-traumatic stress disorder)    Schizophrenia (HCC)    Spinal stenosis    Stroke (HCC)    mild 2013   Thyroid disease    Past Surgical History:  Procedure Laterality Date   ABDOMINAL HYSTERECTOMY     BACK SURGERY     BREAST BIOPSY Right    negative 1988   BREAST EXCISIONAL BIOPSY Right 1988   CHOLECYSTECTOMY     ESOPHAGOGASTRODUODENOSCOPY (EGD) WITH PROPOFOL N/A 08/08/2021   Procedure: ESOPHAGOGASTRODUODENOSCOPY (EGD) WITH PROPOFOL;  Surgeon: Midge Minium, MD;  Location: [facility] END[facility]COPY;  Service: Endoscopy;  Laterality: N/A;   THORACIC LAMINECTOMY FOR SPINAL CORD STIMULATOR N/A 11/08/2021   Procedure: THORACIC LAMINECTOMY FOR SPINAL CORD STIMULATOR IMPLANT (MEDTRONIC);  Surgeon: Venetia Night, MD;  Location: [facility] ORS;  Service: Neurosurgery;  Laterality: N/A;   TUBAL LIGATION     Family History  Problem Relation Age of Onset   Diabetes Mother    Heart failure Mother    Ovarian cancer Mother 44       question dx?   Alzheimer's disease Father    COPD Father    Breast cancer Neg Hx    Social History   Socioeconomic History   Marital status: Single    Spouse name: Not on file   Number of children: Not on file   Years of education: Not on file   Highest education level: Not on file  Occupational History   Not on file   Tobacco Use   Smoking status: Every Day    Packs/day: 1.00    Years: 57.00    Additional pack years: 0.00    Total pack years: 57.00    Types: Cigarettes   Smokeless tobacco: Never   Tobacco comments:    Has cut back to 1/2 PPD  Vaping Use   Vaping Use: Never used  Substance and Sexual Activity   Alcohol use: Yes    Comment: Occasional   Drug use: Never   Sexual activity: Not on file  Other Topics Concern  Not on file  Social History Narrative   Pt lives with her daughter   Social Determinants of Health   Financial Resource Strain: Low Risk  (09/26/2022)   Overall Financial Resource Strain (CARDIA)    Difficulty of Paying Living Expenses: Not hard at all  Food Insecurity: No Food Insecurity (09/26/2022)   Hunger Vital Sign    Worried About Running Out of Food in the Last Year: Never true    Ran Out of Food in the Last Year: Never true  Transportation Needs: No Transportation Needs (09/26/2022)   PRAPARE - Administrator, Civil Service (Medical): No    Lack of Transportation (Non-Medical): No  Physical Activity: Sufficiently Active (09/26/2022)   Exercise Vital Sign    Days of Exercise per Week: 3 days    Minutes of Exercise per Session: 60 min  Stress: No Stress Concern Present (09/26/2022)   [employer] of Occupational Health - Occupational Stress Questionnaire    Feeling of Stress : Not at all  Social Connections: Socially Isolated (09/26/2022)   Social Connection and Isolation Panel [NHANES]    Frequency of Communication with Friends and Family: More than three times a week    Frequency of Social Gatherings with Friends and Family: Twice a week    Attends Religious Services: Never    Database administrator or Organizations: No    Attends Engineer, structural: Never    Marital Status: Divorced    Tobacco Counseling Ready to quit: Not Answered Counseling given: Not Answered Tobacco comments: Has cut back to 1/2 PPD   Clinical  Intake:  Pre-visit preparation completed: Yes  Pain : No/denies pain     Nutritional Risks: None Diabetes: Yes CBG done?: No Did pt. bring in CBG monitor from home?: No  How often do you need to have someone help you when you read instructions, pamphlets, or other written materials from your doctor or pharmacy?: 1 - Never  Diabetic?yes Nutrition Risk Assessment:  Has the patient had any N/V/D within the last 2 months?  Yes - diarrhea Does the patient have any non-healing wounds?  No  Has the patient had any unintentional weight loss or weight gain?  No   Diabetes:  Is the patient diabetic?  Yes  If diabetic, was a CBG obtained today?  No  Did the patient bring in their glucometer from home?  No  How often do you monitor your CBG's? Once per day.   Financial Strains and Diabetes Management:  Are you having any financial strains with the device, your supplies or your medication? No .  Does the patient want to be seen by Chronic Care Management for management of their diabetes?  No  Would the patient like to be referred to a Nutritionist or for Diabetic Management?  No   Diabetic Exams:  Diabetic Eye Exam: Completed ORDERED 04/02/22. Overdue for diabetic eye exam. Pt has been advised about the importance in completing this exam.  Diabetic Foot Exam: Completed 04/02/22. Pt has been advised about the importance in completing this exam.    Interpreter Needed?: No  Information entered by :: Kennedy Bucker, LPN   Activities of Daily Living    09/26/2022    3:07 PM 11/08/2021   12:00 PM  In your present state of health, do you have any difficulty performing the following activities:  Hearing? 1 0  Vision? 0 0  Difficulty concentrating or making decisions? 0 0  Walking or climbing stairs? 1  0  Comment back; legs   Dressing or bathing? 0 0  Doing errands, shopping? 0 0  Preparing Food and eating ? N   Using the Toilet? N   In the past six months, have you accidently  leaked urine? N   Do you have problems with loss of bowel control? N   Managing your Medications? N   Managing your Finances? N   Housekeeping or managing your Housekeeping? N     Patient Care Team: Duanne Limerick, MD as PCP - General (Family Medicine)  Indicate any recent Medical Services you may have received from other than [facility] providers in the past year (date may be approximate).     Assessment:   This is a routine wellness examination for [place].  Hearing/Vision screen Hearing Screening - Comments:: No aids, needs them Vision Screening - Comments:: Wears glasses- [facility]  Dietary issues and exercise activities discussed: Current Exercise Habits: Home exercise routine, Type of exercise: walking, Time (Minutes): 60, Frequency (Times/Week): 3, Weekly Exercise (Minutes/Week): 180, Intensity: Mild   Goals Addressed             This Visit's Progress    DIET - EAT MORE FRUITS AND VEGETABLES         Depression Screen    09/26/2022    3:05 PM 08/03/2022   10:30 AM 06/22/2022    2:48 PM 05/18/2022    1:46 PM 04/02/2022    3:01 PM 11/20/2021    9:57 AM 08/23/2021    1:33 PM  PHQ 2/9 Scores  PHQ - 2 Score 2 4 6 6  0 1 0  PHQ- 9 Score 4 9 16 13  0 2     Fall Risk    09/26/2022    3:07 PM 08/03/2022   10:29 AM 06/22/2022    2:48 PM 05/18/2022    1:46 PM 04/02/2022    3:01 PM  Fall Risk   Falls in the past year? 1 0 0 0 0  Number falls in past yr: 0 0 0 0 0  Injury with Fall? 0 0 0 0 0  Risk for fall due to : Impaired balance/gait No Fall Risks No Fall Risks No Fall Risks No Fall Risks  Follow up Falls prevention discussed;Falls evaluation completed Falls evaluation completed Falls evaluation completed Falls evaluation completed Falls evaluation completed    FALL RISK PREVENTION PERTAINING TO THE HOME:  Any stairs in or around the home? Yes  If so, are there any without handrails? No  Home free of loose throw rugs in walkways, pet beds, electrical cords, etc? Yes   Adequate lighting in your home to reduce risk of falls? Yes   ASSISTIVE DEVICES UTILIZED TO PREVENT FALLS:  Life alert? No  Use of a cane, walker or w/c? Yes - cane when dizzy Grab bars in the bathroom? Yes  Shower chair or bench in shower? No  Elevated toilet seat or a handicapped toilet? No   Cognitive Function:        09/26/2022    3:15 PM  6CIT Screen  What Year? 0 points  What month? 0 points  What time? 0 points  Count back from 20 0 points  Months in reverse 0 points  Repeat phrase 4 points  Total Score 4 points    Immunizations Immunization History  Administered Date(s) Administered   Influenza Whole 01/21/2014   Influenza, High Dose Seasonal PF 03/24/2020, 01/29/2022   Influenza,inj,Quad PF,6+ Mos 12/23/2014, 12/12/2015   Influenza-Unspecified 12/12/2015  Moderna Sars-Covid-2 Vaccination 09/09/2019, 10/07/2019   Pneumococcal Conjugate-13 09/09/2019   Pneumococcal Polysaccharide-23 04/23/2012, 03/26/2018   Pneumococcal-Unspecified 04/23/2013   Tdap 04/23/2010   Zoster Recombinat (Shingrix) 12/07/2019, 10/07/2020   Zoster, Live 12/07/2019    TDAP status: Due, Education has been provided regarding the importance of this vaccine. Advised may receive this vaccine at local pharmacy or Health Dept. Aware to provide a copy of the vaccination record if obtained from local pharmacy or Health Dept. Verbalized acceptance and understanding.  Flu Vaccine status: Up to date  Pneumococcal vaccine status: Up to date  Covid-19 vaccine status: Completed vaccines  Qualifies for Shingles Vaccine? Yes   Zostavax completed Yes   Shingrix Completed?: Yes  Screening Tests Health Maintenance  Topic Date Due   Hepatitis C Screening  Never done   OPHTHALMOLOGY EXAM  09/02/2021   COVID-19 Vaccine (3 - 2023-24 season) 12/22/2021   Lung Cancer Screening  09/16/2022   INFLUENZA VACCINE  11/22/2022   HEMOGLOBIN A1C  03/21/2023   Pneumonia Vaccine 79+ Years old (3 of 3 - PPSV23  or PCV20) 03/27/2023   Diabetic kidney evaluation - eGFR measurement  04/03/2023   Diabetic kidney evaluation - Urine ACR  04/03/2023   FOOT EXAM  04/03/2023   Medicare Annual Wellness (AWV)  09/26/2023   MAMMOGRAM  11/15/2023   Colonoscopy  02/24/2029   DEXA SCAN  Completed   Zoster Vaccines- Shingrix  Completed   HPV VACCINES  Aged Out   DTaP/Tdap/Td  Discontinued    Health Maintenance  Health Maintenance Due  Topic Date Due   Hepatitis C Screening  Never done   OPHTHALMOLOGY EXAM  09/02/2021   COVID-19 Vaccine (3 - 2023-24 season) 12/22/2021   Lung Cancer Screening  09/16/2022    Colorectal cancer screening: Type of screening: Colonoscopy. Completed 02/25/19. Repeat every 10 years  Mammogram status: Completed 11/14/21. Repeat every year  Bone Density status: Completed 01/09/21. Results reflect: Bone density results: NORMAL. Repeat every 5 years.  Lung Cancer Screening: (Low Dose CT Chest recommended if Age 68-80 years, 30 pack-year currently smoking OR have quit w/in 15years.) does not qualify.    Additional Screening:  Hepatitis C Screening: does qualify; Completed NO  Vision Screening: Recommended annual ophthalmology exams for early detection of glaucoma and other disorders of the eye. Is the patient up to date with their annual eye exam?  Yes  Who is the provider or what is the name of the office in which the patient attends annual eye exams? [facility] If pt is not established with a provider, would they like to be referred to a provider to establish care? No .   Dental Screening: Recommended annual dental exams for proper oral hygiene  Community Resource Referral / Chronic Care Management: CRR required this visit?  No   CCM required this visit?  No      Plan:     I have personally reviewed and noted the following in the patient's chart:   Medical and social history Use of alcohol, tobacco or illicit drugs  Current medications and supplements including  opioid prescriptions. Patient is currently taking opioid prescriptions. Information provided to patient regarding non-opioid alternatives. Patient advised to discuss non-opioid treatment plan with their provider. Functional ability and status Nutritional status Physical activity Advanced directives List of other physicians Hospitalizations, surgeries, and ER visits in previous 12 months Vitals Screenings to include cognitive, depression, and falls Referrals and appointments  In addition, I have reviewed and discussed with patient certain preventive  protocols, quality metrics, and best practice recommendations. A written personalized care plan for preventive services as well as general preventive health recommendations were provided to patient.     Hal Hope, LPN   04/28/1094   Nurse Notes: none

## 2022-09-26 NOTE — Patient Instructions (Signed)
Mary Nicholson , Thank you for taking time to come for your Medicare Wellness Visit. I appreciate your ongoing commitment to your health goals. Please review the following plan we discussed and let me know if I can assist you in the future.   These are the goals we discussed:  Goals      DIET - EAT MORE FRUITS AND VEGETABLES     Quit Smoking     If you wish to quit smoking, help is available. For free tobacco cessation program offerings call the [facility] at [phone] or Live Well Line at [phone]. You may also visit [email or web address] or email [email or web address] for more information on other programs.          This is a list of the screening recommended for you and due dates:  Health Maintenance  Topic Date Due   Hepatitis C Screening  Never done   Eye exam for diabetics  09/02/2021   COVID-19 Vaccine (3 - 2023-24 season) 12/22/2021   Screening for Lung Cancer  09/16/2022   Flu Shot  11/22/2022   Hemoglobin A1C  03/21/2023   Pneumonia Vaccine (3 of 3 - PPSV23 or PCV20) 03/27/2023   Yearly kidney function blood test for diabetes  04/03/2023   Yearly kidney health urinalysis for diabetes  04/03/2023   Complete foot exam   04/03/2023   Medicare Annual Wellness Visit  09/26/2023   Mammogram  11/15/2023   Colon Cancer Screening  02/24/2029   DEXA scan (bone density measurement)  Completed   Zoster (Shingles) Vaccine  Completed   HPV Vaccine  Aged Out   DTaP/Tdap/Td vaccine  Discontinued    Advanced directives: no  Conditions/risks identified: none  Next appointment: Follow up in one year for your annual wellness visit 10/02/23 @ 2:00 pm by phone   Preventive Care 65 Years and Older, Female Preventive care refers to lifestyle choices and visits with your health care provider that can promote health and wellness. What does preventive care include? A yearly physical exam. This is also called an annual well check. Dental exams once or twice  a year. Routine eye exams. Ask your health care provider how often you should have your eyes checked. Personal lifestyle choices, including: Daily care of your teeth and gums. Regular physical activity. Eating a healthy diet. Avoiding tobacco and drug use. Limiting alcohol use. Practicing safe sex. Taking low-dose aspirin every day. Taking vitamin and mineral supplements as recommended by your health care provider. What happens during an annual well check? The services and screenings done by your health care provider during your annual well check will depend on your age, overall health, lifestyle risk factors, and family history of disease. Counseling  Your health care provider may ask you questions about your: Alcohol use. Tobacco use. Drug use. Emotional well-being. Home and relationship well-being. Sexual activity. Eating habits. History of falls. Memory and ability to understand (cognition). Work and work Astronomer. Reproductive health. Screening  You may have the following tests or measurements: Height, weight, and BMI. Blood pressure. Lipid and cholesterol levels. These may be checked every 5 years, or more frequently if you are over 24 years old. Skin check. Lung cancer screening. You may have this screening every year starting at age 82 if you have a 30-pack-year history of smoking and currently smoke or have quit within the past 15 years. Fecal occult blood test (FOBT) of the stool. You may have this test every year starting at age 65.  Flexible sigmoidoscopy or colonoscopy. You may have a sigmoidoscopy every 5 years or a colonoscopy every 10 years starting at age 14. Hepatitis C blood test. Hepatitis B blood test. Sexually transmitted disease (STD) testing. Diabetes screening. This is done by checking your blood sugar (glucose) after you have not eaten for a while (fasting). You may have this done every 1-3 years. Bone density scan. This is done to screen for  osteoporosis. You may have this done starting at age 17. Mammogram. This may be done every 1-2 years. Talk to your health care provider about how often you should have regular mammograms. Talk with your health care provider about your test results, treatment options, and if necessary, the need for more tests. Vaccines  Your health care provider may recommend certain vaccines, such as: Influenza vaccine. This is recommended every year. Tetanus, diphtheria, and acellular pertussis (Tdap, Td) vaccine. You may need a Td booster every 10 years. Zoster vaccine. You may need this after age 72. Pneumococcal 13-valent conjugate (PCV13) vaccine. One dose is recommended after age 26. Pneumococcal polysaccharide (PPSV23) vaccine. One dose is recommended after age 60. Talk to your health care provider about which screenings and vaccines you need and how often you need them. This information is not intended to replace advice given to you by your health care provider. Make sure you discuss any questions you have with your health care provider. Document Released: 05/06/2015 Document Revised: 12/28/2015 Document Reviewed: 02/08/2015 Elsevier Interactive Patient Education  2017 [employer].  Fall Prevention in the Home Falls can cause injuries. They can happen to people of all ages. There are many things you can do to make your home safe and to help prevent falls. What can I do on the outside of my home? Regularly fix the edges of walkways and driveways and fix any cracks. Remove anything that might make you trip as you walk through a door, such as a raised step or threshold. Trim any bushes or trees on the path to your home. Use bright outdoor lighting. Clear any walking paths of anything that might make someone trip, such as rocks or tools. Regularly check to see if handrails are loose or broken. Make sure that both sides of any steps have handrails. Any raised decks and porches should have guardrails on  the edges. Have any leaves, snow, or ice cleared regularly. Use sand or salt on walking paths during winter. Clean up any spills in your garage right away. This includes oil or grease spills. What can I do in the bathroom? Use night lights. Install grab bars by the toilet and in the tub and shower. Do not use towel bars as grab bars. Use non-skid mats or decals in the tub or shower. If you need to sit down in the shower, use a plastic, non-slip stool. Keep the floor dry. Clean up any water that spills on the floor as soon as it happens. Remove soap buildup in the tub or shower regularly. Attach bath mats securely with double-sided non-slip rug tape. Do not have throw rugs and other things on the floor that can make you trip. What can I do in the bedroom? Use night lights. Make sure that you have a light by your bed that is easy to reach. Do not use any sheets or blankets that are too big for your bed. They should not hang down onto the floor. Have a firm chair that has side arms. You can use this for support while you get  dressed. Do not have throw rugs and other things on the floor that can make you trip. What can I do in the kitchen? Clean up any spills right away. Avoid walking on wet floors. Keep items that you use a lot in easy-to-reach places. If you need to reach something above you, use a strong step stool that has a grab bar. Keep electrical cords out of the way. Do not use floor polish or wax that makes floors slippery. If you must use wax, use non-skid floor wax. Do not have throw rugs and other things on the floor that can make you trip. What can I do with my stairs? Do not leave any items on the stairs. Make sure that there are handrails on both sides of the stairs and use them. Fix handrails that are broken or loose. Make sure that handrails are as long as the stairways. Check any carpeting to make sure that it is firmly attached to the stairs. Fix any carpet that is loose  or worn. Avoid having throw rugs at the top or bottom of the stairs. If you do have throw rugs, attach them to the floor with carpet tape. Make sure that you have a light switch at the top of the stairs and the bottom of the stairs. If you do not have them, ask someone to add them for you. What else can I do to help prevent falls? Wear shoes that: Do not have high heels. Have rubber bottoms. Are comfortable and fit you well. Are closed at the toe. Do not wear sandals. If you use a stepladder: Make sure that it is fully opened. Do not climb a closed stepladder. Make sure that both sides of the stepladder are locked into place. Ask someone to hold it for you, if possible. Clearly mark and make sure that you can see: Any grab bars or handrails. First and last steps. Where the edge of each step is. Use tools that help you move around (mobility aids) if they are needed. These include: Canes. Walkers. Scooters. Crutches. Turn on the lights when you go into a dark area. Replace any light bulbs as soon as they burn out. Set up your furniture so you have a clear path. Avoid moving your furniture around. If any of your floors are uneven, fix them. If there are any pets around you, be aware of where they are. Review your medicines with your doctor. Some medicines can make you feel dizzy. This can increase your chance of falling. Ask your doctor what other things that you can do to help prevent falls. This information is not intended to replace advice given to you by your health care provider. Make sure you discuss any questions you have with your health care provider. Document Released: 02/03/2009 Document Revised: 09/15/2015 Document Reviewed: 05/14/2014 Elsevier Interactive Patient Education  2017 [employer].

## 2022-09-28 ENCOUNTER — Other Ambulatory Visit: Payer: Self-pay | Admitting: Family Medicine

## 2022-09-28 DIAGNOSIS — N3281 Overactive bladder: Secondary | ICD-10-CM

## 2022-09-28 NOTE — Telephone Encounter (Signed)
Requested Prescriptions  Pending Prescriptions Disp Refills   oxybutynin (DITROPAN-XL) 5 MG 24 hr tablet [Pharmacy Med Name: OXYBUTYNIN CL ER 5 MG TABLET] 90 tablet 3    Sig: TAKE 1 TABLET BY MOUTH EVERYDAY AT BEDTIME     Urology:  Bladder Agents Passed - 09/28/2022  2:35 AM      Passed - Valid encounter within last 12 months    Recent Outpatient Visits           1 month ago Type 2 diabetes mellitus with diabetic neuropathy, without long-term current use of insulin (HCC)   [place] [facility]   3 months ago Essential hypertension   [place] [facility]   4 months ago Essential hypertension   [place] [facility]   5 months ago Essential hypertension   [place] [facility]   10 months ago Essential hypertension   [place] [facility]       Future Appointments             In 2 months Duanne Limerick, MD [facility] at [facility], [facility]

## 2022-10-02 ENCOUNTER — Encounter: Payer: Self-pay | Admitting: Urology

## 2022-10-11 ENCOUNTER — Other Ambulatory Visit: Payer: Self-pay | Admitting: Family Medicine

## 2022-10-11 ENCOUNTER — Other Ambulatory Visit: Payer: Self-pay | Admitting: Neurosurgery

## 2022-10-11 ENCOUNTER — Telehealth: Payer: Self-pay

## 2022-10-11 DIAGNOSIS — J301 Allergic rhinitis due to pollen: Secondary | ICD-10-CM

## 2022-10-11 DIAGNOSIS — I10 Essential (primary) hypertension: Secondary | ICD-10-CM

## 2022-10-11 NOTE — Telephone Encounter (Signed)
Called pt- need her to return call concerning Rxs

## 2022-10-11 NOTE — Telephone Encounter (Signed)
Unable to refill per protocol, Rx request is too soon.  Requested Prescriptions  Pending Prescriptions Disp Refills   hydrochlorothiazide (HYDRODIURIL) 25 MG tablet [Pharmacy Med Name: HYDROCHLOROTHIAZIDE 25 MG TAB] 90 tablet 1    Sig: TAKE 1 TABLET (25 MG TOTAL) BY MOUTH DAILY.     Cardiovascular: Diuretics - Thiazide Failed - 10/11/2022  1:37 PM      Failed - Cr in normal range and within 180 days    Creatinine, Ser  Date Value Ref Range Status  04/02/2022 1.00 0.57 - 1.00 mg/dL Final         Failed - K in normal range and within 180 days    Potassium  Date Value Ref Range Status  04/02/2022 4.2 3.5 - 5.2 mmol/L Final         Failed - Na in normal range and within 180 days    Sodium  Date Value Ref Range Status  04/02/2022 138 134 - 144 mmol/L Final         Passed - Last BP in normal range    BP Readings from Last 1 Encounters:  08/25/22 130/87         Passed - Valid encounter within last 6 months    Recent Outpatient Visits           2 months ago Type 2 diabetes mellitus with diabetic neuropathy, without long-term current use of insulin (HCC)   [place] [facility]   3 months ago Essential hypertension   [place] [facility]   4 months ago Essential hypertension   [place] [facility]   6 months ago Essential hypertension   [place] [facility]   10 months ago Essential hypertension   [place] [facility]       Future Appointments             In 2 months Duanne Limerick, MD [facility] at [facility], [facility]             fluticasone [facility]) 50 MCG/ACT nasal spray [Pharmacy Med Name: FLUTICASONE PROP 50 MCG SPRAY] 48 mL  0    Sig: SPRAY 2 SPRAYS INTO EACH NOSTRIL EVERY DAY     Ear, Nose, and Throat: Nasal Preparations - Corticosteroids Passed - 10/11/2022  1:37 PM      Passed - Valid encounter within last 12 months    Recent Outpatient Visits           2 months ago Type 2 diabetes mellitus with diabetic neuropathy, without long-term current use of insulin (HCC)   [place] [facility]   3 months ago Essential hypertension   [place] [facility]   4 months ago Essential hypertension   [place] [facility]   6 months ago Essential hypertension    [facility]   10 months ago Essential hypertension    [facility] Care & Sports Medicine at [facility]  Duanne Limerick, MD       Future Appointments             In 2 months Duanne Limerick, MD [facility] Medicine at [facility], [facility]

## 2022-10-13 ENCOUNTER — Other Ambulatory Visit: Payer: Self-pay | Admitting: Family Medicine

## 2022-10-13 DIAGNOSIS — E1140 Type 2 diabetes mellitus with diabetic neuropathy, unspecified: Secondary | ICD-10-CM

## 2022-10-16 ENCOUNTER — Telehealth: Payer: Self-pay

## 2022-10-16 NOTE — Telephone Encounter (Signed)
Left message for pt to return call- does she want to use [facility] now?

## 2022-10-17 ENCOUNTER — Other Ambulatory Visit: Payer: Self-pay | Admitting: Family Medicine

## 2022-10-17 DIAGNOSIS — E039 Hypothyroidism, unspecified: Secondary | ICD-10-CM

## 2022-10-17 NOTE — Telephone Encounter (Signed)
Requested by interface surescripts.  Requested Prescriptions  Pending Prescriptions Disp Refills   levothyroxine (SYNTHROID) 100 MCG tablet [Pharmacy Med Name: LEVOTHYROXINE 100 MCG TABLET] 90 tablet 1    Sig: TAKE 1 TABLET BY MOUTH EVERY DAY     Endocrinology:  Hypothyroid Agents Passed - 10/17/2022  2:23 AM      Passed - TSH in normal range and within 360 days    TSH  Date Value Ref Range Status  04/02/2022 3.500 0.450 - 4.500 uIU/mL Final         Passed - Valid encounter within last 12 months    Recent Outpatient Visits           2 months ago Type 2 diabetes mellitus with diabetic neuropathy, without long-term current use of insulin (HCC)   [place] [facility]   3 months ago Essential hypertension   [place] [facility]   5 months ago Essential hypertension   [place] [facility]   6 months ago Essential hypertension   [place] [facility]   11 months ago Essential hypertension   [place] [facility]       Future Appointments             In 1 month Duanne Limerick, MD [facility] at [facility], [facility]

## 2022-10-29 ENCOUNTER — Other Ambulatory Visit: Payer: Self-pay | Admitting: Family Medicine

## 2022-10-29 DIAGNOSIS — E1140 Type 2 diabetes mellitus with diabetic neuropathy, unspecified: Secondary | ICD-10-CM

## 2022-11-01 ENCOUNTER — Telehealth: Payer: Self-pay

## 2022-11-01 NOTE — Telephone Encounter (Signed)
Reached pt- she does not want to use mail order- I sent request with info on front of page stating that

## 2022-11-02 ENCOUNTER — Other Ambulatory Visit: Payer: Self-pay | Admitting: Family Medicine

## 2022-11-02 DIAGNOSIS — E7801 Familial hypercholesterolemia: Secondary | ICD-10-CM

## 2022-11-06 ENCOUNTER — Ambulatory Visit: Payer: Self-pay | Admitting: *Deleted

## 2022-11-06 NOTE — Telephone Encounter (Signed)
Left voice mail to reach out to the office to set up appointment with another provider.

## 2022-11-06 NOTE — Telephone Encounter (Signed)
  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   I've never had it this bad. Symptoms: The bumps itch and have pus pockets on top of them.  They are on my legs, belly ,, thighs and back.   They were on my ears last week but those went away. Frequency: Today the rash/MRSA is spreading real bad all over me   I usually need an antibiotic. Pertinent Negatives: Patient denies N/A Disposition: [] ED /[] Urgent Care (no appt availability in office) / [x] Appointment(In office/virtual)/ []  [place] [facility]/ [] Home Care/ [] Refused Recommended Disposition /[] [place] [facility]/ []  Follow-up with PCP Additional Notes: While talking with her her other line rang.   She tried to answer it and accidentally cut [place] off.  I attempted to call her back but got her voicemail.   I left a message for her to call back and any of the nurses would be glad to finish helping her.   She needs to be scheduled an appt.   Did not get far enough through the triage to find out if she is having shortness of breath or chest discomfort.     I called her a second time but still got her answering machine so I left another voicemail to call Primary Care and Sports Medicine at [facility] and any ot the nurses could assist her.

## 2022-11-06 NOTE — Telephone Encounter (Signed)
Answer Assessment - Initial Assessment Questions 1. 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  This is the worst I've broken out before.   I usually get an antibiotic.    2. SIZE: "How big are the spots?" (e.g., tip of pen, eraser, coin; inches, centimeters)     Legs, belly, thighs, back.   It's all over. 3. LOCATION: "Where is the rash located?"     Started a week ago on my ears but it went away.  But today it's spreading badly.       They itch and have pus pockets on top of them.    I'm covering them with a band aid.    4. COLOR: "What color is the rash?" (Note: It is difficult to assess rash color in people with darker-colored skin. When this situation occurs, simply ask the caller to describe what they see.)     Pus on top of the rash. 5. ONSET: "When did the rash begin?"     Yesterday but today it's worse. 6. FEVER: "Do you have a fever?" If Yes, ask: "What is your temperature, how was it measured, and when did it start?"     Not asked 7. ITCHING: "Does the rash itch?" If Yes, ask: "How bad is the itch?" (Scale 1-10; or mild, moderate, severe)     Yes 8. CAUSE: "What do you think is causing the rash?"     MRSA 9. MEDICINE FACTORS: "Have you started any new medicines within the last 2 weeks?" (e.g., antibiotics)      She accidentally hung up on me trying to answer her other line.  I called her back but got her voicemail.    I left a message for her to call back. 10. OTHER SYMPTOMS: "Do you have any other symptoms?" (e.g., dizziness, headache, sore throat, joint pain)        11. PREGNANCY: "Is there any chance you are pregnant?" "When was your last menstrual period?"  Protocols used: Rash or Redness - [facility]

## 2022-11-06 NOTE — Telephone Encounter (Addendum)
Error, duplicate    rash   Pt has rash at different places on her body that started over a week ago.  This has been recurring.  Does not itch.  Looks like a pimple. She thinks it is like MRSA. Cb [id number]  (pt is at her sister house)

## 2022-11-06 NOTE — Telephone Encounter (Signed)
Pt returned call. Denies SOB, swelling. States going out of town tomorrow and "Would really like something to clear this up. I've had this before."  Please advise.

## 2022-11-09 NOTE — Progress Notes (Deleted)
Referring Physician:  No referring provider defined for this encounter.  Primary Physician:  Duanne Limerick, MD  History of Present Illness: 11/09/2022*** [name] has a history of prior CVAs with persistent word finding difficulty, hyperlipidemia, diabetes, GERD, seasonal allergies, PTSD, bipolar, COPD, history of cervical CA, schizophrenia, hypothyroidism, ?breast CA.   History of spinal fusion in 1990s.   She is s/p SCS placement by Dr. Myer Haff on 11/09/22. Last seen on 01/25/22 and she was doing well.           Duration: *** Location: *** Quality: *** Severity: ***  Precipitating: aggravated by *** Modifying factors: made better by *** Weakness: none Timing: *** Bowel/Bladder Dysfunction: none  Conservative measures:  Physical therapy: ***  Multimodal medical therapy including regular antiinflammatories: ***  Injections: *** epidural steroid injections  Past Surgery:  History of spinal fusion in 1990s.  SCS placement by Dr. Myer Haff on 11/09/22.   Mary Nicholson has ***no symptoms of cervical myelopathy.  The symptoms are causing a significant impact on the patient's life.   Review of Systems:  A 10 point review of systems is negative, except for the pertinent positives and negatives detailed in the HPI.  Past Medical History: Past Medical History:  Diagnosis Date   Anemia    Arthritis    Bipolar 1 disorder (HCC)    Cancer (HCC)    cervical   Chronic kidney disease    COPD (chronic obstructive pulmonary disease) (HCC)    Diabetes mellitus without complication (HCC)    Dyspnea    GERD (gastroesophageal reflux disease)    History of kidney stones    Hx of right breast biopsy 1988   RIGHT excisional bx 1988   Hyperlipidemia    Hypertension    Hypothyroidism    Pneumonia    PTSD (post-traumatic stress disorder)    Schizophrenia (HCC)    Spinal stenosis    Stroke (HCC)    mild 2013   Thyroid disease     Past  Surgical History: Past Surgical History:  Procedure Laterality Date   ABDOMINAL HYSTERECTOMY     BACK SURGERY     BREAST BIOPSY Right    negative 1988   BREAST EXCISIONAL BIOPSY Right 1988   CHOLECYSTECTOMY     ESOPHAGOGASTRODUODENOSCOPY (EGD) WITH PROPOFOL N/A 08/08/2021   Procedure: ESOPHAGOGASTRODUODENOSCOPY (EGD) WITH PROPOFOL;  Surgeon: Midge Minium, MD;  Location: [facility];  Service: Endoscopy;  Laterality: N/A;   THORACIC LAMINECTOMY FOR SPINAL CORD STIMULATOR N/A 11/08/2021   Procedure: THORACIC LAMINECTOMY FOR SPINAL CORD STIMULATOR IMPLANT (MEDTRONIC);  Surgeon: Venetia Night, MD;  Location: [facility] ORS;  Service: Neurosurgery;  Laterality: N/A;   TUBAL LIGATION      Allergies: Allergies as of 11/12/2022   (No Known Allergies)    Medications: Outpatient Encounter Medications as of 11/12/2022  Medication Sig   Acetaminophen (TYLENOL ARTHRITIS EXT RELIEF PO) Take 1 capsule by mouth 3 (three) times daily.   albuterol (VENTOLIN HFA) 108 (90 Base) MCG/ACT inhaler INHALE 1-2 PUFFS BY MOUTH EVERY 6 HOURS AS NEEDED FOR WHEEZE OR SHORTNESS OF BREATH   amLODipine (NORVASC) 10 MG tablet Take 1 tablet (10 mg total) by mouth daily.   aspirin 81 MG chewable tablet Chew by mouth.   atorvastatin (LIPITOR) 80 MG tablet TAKE 1 TABLET BY MOUTH EVERY DAY   b complex vitamins capsule Take 1 capsule by mouth daily.   Biotin 5 MG CAPS Take 1 capsule by mouth daily.   busPIRone (BUSPAR)  15 MG tablet TAKE 1 TABLET BY MOUTH 3 TIMES DAILY.   carboxymethylcellulose (REFRESH PLUS) 0.5 % SOLN Place 1 drop into both eyes daily. 1 drop in left eye BiD and 1 drop to right eye daily   famotidine (PEPCID) 40 MG tablet Take 1 tablet (40 mg total) by mouth daily.   fluticasone (FLONASE) 50 MCG/ACT nasal spray SPRAY 2 SPRAYS INTO EACH NOSTRIL EVERY DAY   Fluticasone-Umeclidin-Vilant (TRELEGY ELLIPTA) 100-62.5-25 MCG/ACT AEPB INHALE 1 PUFF BY MOUTH EVERY DAY   gabapentin (NEURONTIN) 300 MG capsule Take  1 capsule by mouth 3 (three) times daily.   glipiZIDE (GLUCOTROL XL) 10 MG 24 hr tablet Take 1 tablet (10 mg total) by mouth daily.   hydrochlorothiazide (HYDRODIURIL) 25 MG tablet Take 1 tablet (25 mg total) by mouth daily.   hydrOXYzine (ATARAX) 25 MG tablet Take 25 mg by mouth 2 (two) times daily.   lamoTRIgine (LAMICTAL) 100 MG tablet Take 1 tablet (100 mg total) by mouth 2 (two) times daily. cbc   levothyroxine (SYNTHROID) 100 MCG tablet TAKE 1 TABLET BY MOUTH EVERY DAY   lisinopril (ZESTRIL) 10 MG tablet Take 1 tablet (10 mg total) by mouth daily.   metFORMIN (GLUCOPHAGE) 1000 MG tablet Take 1 tablet (1,000 mg total) by mouth 2 (two) times daily with a meal.   metoprolol succinate (TOPROL-XL) 50 MG 24 hr tablet Take 50 mg by mouth daily.   Multiple Vitamins-Minerals (CENTRUM SILVER PO) Take 1 each by mouth daily.   neomycin-polymyxin b-dexamethasone (MAXITROL) 3.5-10000-0.1 SUSP SMARTSIG:In Eye(s) (Patient not taking: Reported on 09/26/2022)   OneTouch Delica Lancets 33G MISC USE 1 EACH ONCE DAILY USE AS INSTRUCTED.   ONETOUCH ULTRA test strip 3 (three) times daily.   oxybutynin (DITROPAN-XL) 5 MG 24 hr tablet TAKE 1 TABLET BY MOUTH EVERYDAY AT BEDTIME   pioglitazone (ACTOS) 15 MG tablet TAKE 1 TABLET (15 MG TOTAL) BY MOUTH DAILY.   traZODone (DESYREL) 50 MG tablet Take 50 mg by mouth at bedtime as needed.   venlafaxine XR (EFFEXOR-XR) 75 MG 24 hr capsule Take 2 capsules (150 mg total) by mouth in the morning and at bedtime.   No facility-administered encounter medications on file as of 11/12/2022.    Social History: Social History   Tobacco Use   Smoking status: Every Day    Current packs/day: 1.00    Average packs/day: 1 pack/day for 57.0 years (57.0 ttl pk-yrs)    Types: Cigarettes   Smokeless tobacco: Never   Tobacco comments:    Has cut back to 1/2 PPD  Vaping Use   Vaping status: Never Used  Substance Use Topics   Alcohol use: Yes    Comment: Occasional   Drug use: Never     Family Medical History: Family History  Problem Relation Age of Onset   Diabetes Mother    Heart failure Mother    Ovarian cancer Mother 62       question dx?   Alzheimer's disease Father    COPD Father    Breast cancer Neg Hx     Physical Examination: There were no vitals filed for this visit.  General: Patient is well developed, well nourished, calm, collected, and in no apparent distress. Attention to examination is appropriate.  Respiratory: Patient is breathing without any difficulty.   NEUROLOGICAL:     Awake, alert, oriented to person, place, and time.  Speech is clear and fluent. Fund of knowledge is appropriate.   Cranial Nerves: Pupils equal round and reactive  to light.  Facial tone is symmetric.    *** ROM of cervical spine *** pain *** posterior cervical tenderness. *** tenderness in bilateral trapezial region.   *** ROM of lumbar spine *** pain *** posterior lumbar tenderness.   No abnormal lesions on exposed skin.   Strength: Side Biceps Triceps Deltoid Interossei Grip Wrist Ext. Wrist Flex.  R 5 5 5 5 5 5 5   L 5 5 5 5 5 5 5    Side Iliopsoas Quads Hamstring PF DF EHL  R 5 5 5 5 5 5   L 5 5 5 5 5 5    Reflexes are ***2+ and symmetric at the biceps, brachioradialis, patella and achilles.   Hoffman's is absent.  Clonus is not present.   Bilateral upper and lower extremity sensation is intact to light touch.     Gait is normal.   ***No difficulty with tandem gait.    Medical Decision Making  Imaging: none  Assessment and Plan: [name] is a pleasant 67 y.o. female has ***  Treatment options discussed with patient and following plan made:   - Order for physical therapy for *** spine ***. Patient to call to schedule appointment. *** - Continue current medications including ***. Reviewed dosing and side effects.  - Prescription for ***. Reviewed dosing and side effects. Take with food.  - Prescription for *** to take prn muscle spasms.  Reviewed dosing and side effects. Discussed this can cause drowsiness.  - MRI of *** to further evaluate *** radiculopathy. No improvement time or medications (***).  - Referral to PMR at [facility] to discuss possible *** injections.  - Will schedule phone visit to review MRI results once I get them back.   I spent a total of *** minutes in face-to-face and non-face-to-face activities related to this patient's care today including review of outside records, review of imaging, review of symptoms, physical exam, discussion of differential diagnosis, discussion of treatment options, and documentation.   Thank you for involving me in the care of this patient.   Drake Leach PA-C Dept. of Neurosurgery

## 2022-11-12 ENCOUNTER — Ambulatory Visit: Payer: Medicare HMO | Admitting: Orthopedic Surgery

## 2022-11-19 ENCOUNTER — Ambulatory Visit
Admission: RE | Admit: 2022-11-19 | Discharge: 2022-11-19 | Disposition: A | Discharge status: Home / Self Care | Payer: 59 | Source: Ambulatory Visit | Attending: Family Medicine | Admitting: Family Medicine

## 2022-11-19 DIAGNOSIS — Z0000 Encounter for general adult medical examination without abnormal findings: Secondary | ICD-10-CM | POA: Insufficient documentation

## 2022-11-19 DIAGNOSIS — Z1231 Encounter for screening mammogram for malignant neoplasm of breast: Secondary | ICD-10-CM | POA: Diagnosis not present

## 2022-12-11 ENCOUNTER — Other Ambulatory Visit: Payer: Self-pay | Admitting: Family Medicine

## 2022-12-11 DIAGNOSIS — E1140 Type 2 diabetes mellitus with diabetic neuropathy, unspecified: Secondary | ICD-10-CM

## 2022-12-13 ENCOUNTER — Other Ambulatory Visit: Payer: Self-pay | Admitting: Family Medicine

## 2022-12-13 DIAGNOSIS — I10 Essential (primary) hypertension: Secondary | ICD-10-CM

## 2022-12-13 NOTE — Telephone Encounter (Signed)
Requested medication (s) are due for refill today: yes  Requested medication (s) are on the active medication list: yes  Last refill:  08/03/22  Future visit scheduled: yes  Notes to clinic:  Unable to refill per protocol, last refill by historical provider.      Requested Prescriptions  Pending Prescriptions Disp Refills   metoprolol succinate (TOPROL-XL) 50 MG 24 hr tablet [Pharmacy Med Name: METOPROLOL SUCC ER 50 MG TAB] 90 tablet 1    Sig: TAKE 1 TABLET BY MOUTH EVERY DAY     Cardiovascular:  Beta Blockers Passed - 12/13/2022  1:30 AM      Passed - Last BP in normal range    BP Readings from Last 1 Encounters:  08/25/22 130/87         Passed - Last Heart Rate in normal range    Pulse Readings from Last 1 Encounters:  08/25/22 95         Passed - Valid encounter within last 6 months    Recent Outpatient Visits           4 months ago Type 2 diabetes mellitus with diabetic neuropathy, without long-term current use of insulin (HCC)   [place] [facility]   5 months ago Essential hypertension   [place] [facility]   6 months ago Essential hypertension   [place] [facility]   8 months ago Essential hypertension   [place] [facility] MD   1 year ago Essential hypertension   [place] [facility]       Future Appointments             Tomorrow Duanne Limerick, MD [facility] at [facility], [facility]

## 2022-12-14 ENCOUNTER — Ambulatory Visit (INDEPENDENT_AMBULATORY_CARE_PROVIDER_SITE_OTHER): Payer: 59 | Admitting: Family Medicine

## 2022-12-14 ENCOUNTER — Encounter: Payer: Self-pay | Admitting: Family Medicine

## 2022-12-14 VITALS — BP 110/68 | HR 60 | Ht 61.0 in | Wt 133.0 lb

## 2022-12-14 DIAGNOSIS — Z7984 Long term (current) use of oral hypoglycemic drugs: Secondary | ICD-10-CM | POA: Diagnosis not present

## 2022-12-14 DIAGNOSIS — E119 Type 2 diabetes mellitus without complications: Secondary | ICD-10-CM | POA: Diagnosis not present

## 2022-12-14 MED ORDER — GLIPIZIDE ER 10 MG PO TB24: 10.0000 mg | ORAL_TABLET | Freq: Every day | ORAL | 1 refills | Status: DC

## 2022-12-14 MED ORDER — AMLODIPINE BESYLATE 10 MG PO TABS: 10.0000 mg | ORAL_TABLET | Freq: Every day | ORAL | 1 refills | Status: DC

## 2022-12-14 MED ORDER — LISINOPRIL 10 MG PO TABS: 10.0000 mg | ORAL_TABLET | Freq: Every day | ORAL | 3 refills | Status: DC

## 2022-12-14 MED ORDER — PIOGLITAZONE HCL 15 MG PO TABS: 15.0000 mg | ORAL_TABLET | Freq: Every day | ORAL | 1 refills | Status: DC

## 2022-12-14 MED ORDER — METFORMIN HCL 1000 MG PO TABS: 1000.0000 mg | ORAL_TABLET | Freq: Two times a day (BID) | ORAL | 1 refills | Status: DC

## 2022-12-14 MED ORDER — HYDROCHLOROTHIAZIDE 25 MG PO TABS: 25.0000 mg | ORAL_TABLET | Freq: Every day | ORAL | 1 refills | Status: DC

## 2022-12-14 NOTE — Progress Notes (Signed)
Date:  12/14/2022   Name:  Mary Nicholson   DOB:  [birth year]   MRN:  [id number]   Chief Complaint: Diabetes (At home [phone])  Diabetes She presents for her follow-up diabetic visit. She has type 2 diabetes mellitus. Her disease course has been stable. There are no hypoglycemic associated symptoms. Pertinent negatives for diabetes include no blurred vision, no chest pain, no fatigue, no foot paresthesias, no foot ulcerations, no polydipsia, no polyphagia, no polyuria, no visual change, no weakness and no weight loss. There are no hypoglycemic complications. Symptoms are stable. There are no diabetic complications. Risk factors for coronary artery disease include diabetes mellitus.    Lab Results  Component Value Date   NA 138 04/02/2022   K 4.2 04/02/2022   CO2 21 04/02/2022   GLUCOSE 125 (H) 04/02/2022   BUN 23 04/02/2022   CREATININE 1.00 04/02/2022   CALCIUM 9.8 04/02/2022   EGFR 61 04/02/2022   GFRNONAA >60 10/31/2021   Lab Results  Component Value Date   CHOL 152 11/20/2021   HDL 54 11/20/2021   LDLCALC 68 11/20/2021   TRIG 181 (H) 11/20/2021   Lab Results  Component Value Date   TSH 3.500 04/02/2022   Lab Results  Component Value Date   HGBA1C 7.2 (H) 09/18/2022   Lab Results  Component Value Date   WBC 7.4 10/31/2021   HGB 14.0 10/31/2021   HCT 42.8 10/31/2021   MCV 91.1 10/31/2021   PLT 359 10/31/2021   Lab Results  Component Value Date   ALT 15 08/03/2022   AST 13 08/03/2022   ALKPHOS 94 08/03/2022   BILITOT 0.3 08/03/2022   No results found for: "25OHVITD2", "25OHVITD3", "VD25OH"   Review of Systems  Constitutional:  Negative for fatigue and weight loss.  Eyes:  Negative for blurred vision.  Respiratory:  Negative for cough, chest tightness, wheezing and stridor.   Cardiovascular:  Negative for chest pain, palpitations and leg swelling.  Gastrointestinal:  Negative for abdominal pain, constipation and diarrhea.  Endocrine: Negative for  polydipsia, polyphagia and polyuria.  Neurological:  Negative for weakness.    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   Chronic radicular lumbar pain 05/04/2021   History of lumbar fusion 05/04/2021   Chronic pain syndrome 05/04/2021   Bipolar affective disorder, mixed (HCC) 11/26/2019   COPD (chronic obstructive pulmonary disease) (HCC) 11/26/2019   PTSD (post-traumatic stress disorder) 11/26/2019   Schizophrenia (HCC) 11/26/2019   Abdominal wall cellulitis 11/12/2019   Abdominal wall abscess    Type 2 diabetes mellitus with diabetic neuropathy, without long-term current use of insulin (HCC)    Hyperlipidemia    Bilateral hand numbness 09/16/2019   S/P hysterectomy 09/09/2019   Stage 3b chronic kidney disease (HCC) 09/09/2019   Cervical radiculitis 08/13/2019   Chronic bilateral low back pain with right-sided sciatica 08/13/2019   Lumbar radiculopathy 08/13/2019   Neck pain 08/13/2019   Numbness and tingling of both feet 07/30/2019   Dupuytren contracture 05/22/2019   Personal history of transient ischemic attack (TIA), and cerebral infarction without residual deficits 05/22/2019   Resting tremor 05/22/2019   Diverticulosis 05/20/2019   External hemorrhoid 05/20/2019   Abnormal cervical Papanicolaou smear 12/19/2018   Arthralgia of both hands 12/19/2018   Breast pain, right 12/19/2018   Diarrhea 12/19/2018  Dysphagia 12/19/2018   Paresthesias 12/19/2018   Seasonal allergies 12/19/2018   Gastroesophageal reflux disease 09/23/2015   Spinal stenosis of lumbar region 05/04/2015   Allergic rhinitis 11/28/2014   Acquired hypothyroidism 09/13/2014   Essential hypertension 09/13/2014   Type 2 diabetes mellitus with diabetic neuropathy (HCC) 09/13/2014    No Known Allergies  Past Surgical History:  Procedure Laterality  Date   ABDOMINAL HYSTERECTOMY     BACK SURGERY     BREAST BIOPSY Right    negative 1988   BREAST EXCISIONAL BIOPSY Right 1988   CHOLECYSTECTOMY     ESOPHAGOGASTRODUODENOSCOPY (EGD) WITH PROPOFOL N/A 08/08/2021   Procedure: ESOPHAGOGASTRODUODENOSCOPY (EGD) WITH PROPOFOL;  Surgeon: Midge Minium, MD;  Location: [facility] ENDOSCOPY;  Service: Endoscopy;  Laterality: N/A;   THORACIC LAMINECTOMY FOR SPINAL CORD STIMULATOR N/A 11/08/2021   Procedure: THORACIC LAMINECTOMY FOR SPINAL CORD STIMULATOR IMPLANT (MEDTRONIC);  Surgeon: Venetia Night, MD;  Location: [facility] ORS;  Service: Neurosurgery;  Laterality: N/A;   TUBAL LIGATION      Social History   Tobacco Use   Smoking status: Every Day    Current packs/day: 1.00    Average packs/day: 1 pack/day for 57.0 years (57.0 ttl pk-yrs)    Types: Cigarettes   Smokeless tobacco: Never   Tobacco comments:    Has cut back to 1/2 PPD  Vaping Use   Vaping status: Never Used  Substance Use Topics   Alcohol use: Yes    Comment: Occasional   Drug use: Never     Medication list has been reviewed and updated.  Current Meds  Medication Sig   Acetaminophen (TYLENOL ARTHRITIS EXT RELIEF PO) Take 1 capsule by mouth 3 (three) times daily.   albuterol (VENTOLIN HFA) 108 (90 Base) MCG/ACT inhaler INHALE 1-2 PUFFS BY MOUTH EVERY 6 HOURS AS NEEDED FOR WHEEZE OR SHORTNESS OF BREATH   amLODipine (NORVASC) 10 MG tablet Take 1 tablet (10 mg total) by mouth daily.   aspirin 81 MG chewable tablet Chew by mouth.   atorvastatin (LIPITOR) 80 MG tablet TAKE 1 TABLET BY MOUTH EVERY DAY   b complex vitamins capsule Take 1 capsule by mouth daily.   Biotin 5 MG CAPS Take 1 capsule by mouth daily.   busPIRone (BUSPAR) 15 MG tablet TAKE 1 TABLET BY MOUTH 3 TIMES DAILY.   carboxymethylcellulose (REFRESH PLUS) 0.5 % SOLN Place 1 drop into both eyes daily. 1 drop in left eye BiD and 1 drop to right eye daily   famotidine (PEPCID) 40 MG tablet Take 1 tablet (40 mg total) by  mouth daily.   fluticasone (FLONASE) 50 MCG/ACT nasal spray SPRAY 2 SPRAYS INTO EACH NOSTRIL EVERY DAY   Fluticasone-Umeclidin-Vilant (TRELEGY ELLIPTA) 100-62.5-25 MCG/ACT AEPB INHALE 1 PUFF BY MOUTH EVERY DAY   gabapentin (NEURONTIN) 300 MG capsule Take 1 capsule by mouth 3 (three) times daily.   glipiZIDE (GLUCOTROL XL) 10 MG 24 hr tablet Take 1 tablet (10 mg total) by mouth daily.   hydrochlorothiazide (HYDRODIURIL) 25 MG tablet Take 1 tablet (25 mg total) by mouth daily.   hydrOXYzine (ATARAX) 25 MG tablet Take 25 mg by mouth 2 (two) times daily.   lamoTRIgine (LAMICTAL) 100 MG tablet Take 1 tablet (100 mg total) by mouth 2 (two) times daily. cbc   levothyroxine (SYNTHROID) 100 MCG tablet TAKE 1 TABLET BY MOUTH EVERY DAY   lisinopril (ZESTRIL) 10 MG tablet Take 1 tablet (10 mg total) by mouth daily.   metFORMIN (GLUCOPHAGE) 1000 MG tablet Take 1 tablet (1,000  mg total) by mouth 2 (two) times daily with a meal.   metoprolol succinate (TOPROL-XL) 50 MG 24 hr tablet Take 50 mg by mouth daily.   Multiple Vitamins-Minerals (CENTRUM SILVER PO) Take 1 each by mouth daily.   neomycin-polymyxin b-dexamethasone (MAXITROL) 3.5-10000-0.1 SUSP    OneTouch Delica Lancets 33G MISC USE 1 EACH ONCE DAILY USE AS INSTRUCTED.   ONETOUCH ULTRA test strip 3 (three) times daily.   oxybutynin (DITROPAN-XL) 5 MG 24 hr tablet TAKE 1 TABLET BY MOUTH EVERYDAY AT BEDTIME   pioglitazone (ACTOS) 15 MG tablet TAKE 1 TABLET (15 MG TOTAL) BY MOUTH DAILY.   REXULTI 0.5 MG TABS Take 1 tablet by mouth daily.   traZODone (DESYREL) 50 MG tablet Take 50 mg by mouth at bedtime as needed.   venlafaxine XR (EFFEXOR-XR) 75 MG 24 hr capsule Take 2 capsules (150 mg total) by mouth in the morning and at bedtime.       12/14/2022   10:37 AM 08/03/2022   10:32 AM 06/22/2022    2:49 PM 05/18/2022    1:47 PM  GAD 7 : Generalized Anxiety Score  Nervous, Anxious, on Edge 0 3 3 3   Control/stop worrying 0 3 3 3   Worry too much - different  things 0 3 3 3   Trouble relaxing 0 1 3 3   Restless 0 1 3 3   Easily annoyed or irritable 0 2 3 1   Afraid - awful might happen 0 2 3 3   Total GAD 7 Score 0 15 21 19   Anxiety Difficulty Not difficult at all Very difficult Extremely difficult Very difficult       12/14/2022   10:37 AM 09/26/2022    3:05 PM 08/03/2022   10:30 AM  Depression screen PHQ 2/9  Decreased Interest 0 1 2  Down, Depressed, Hopeless 0 1 2  PHQ - 2 Score 0 2 4  Altered sleeping 0 1 1  Tired, decreased energy 0 1 1  Change in appetite 0 0 1  Feeling bad or failure about yourself  0 0 2  Trouble concentrating 0 0 0  Moving slowly or fidgety/restless 0 0 0  Suicidal thoughts 0 0 0  PHQ-9 Score 0 4 9  Difficult doing work/chores Not difficult at all Somewhat difficult Somewhat difficult    BP Readings from Last 3 Encounters:  12/14/22 110/68  08/25/22 130/87  08/21/22 (!) 156/80    Physical Exam Vitals and nursing note reviewed. Exam conducted with a chaperone present.  Constitutional:      General: She is not in acute distress.    Appearance: She is not diaphoretic.  HENT:     Head: Normocephalic and atraumatic.     Right Ear: Tympanic membrane and external ear normal.     Left Ear: Tympanic membrane and external ear normal.     Nose: Nose normal.     Mouth/Throat:     Mouth: Mucous membranes are moist.  Eyes:     General:        Left eye: No discharge.     Conjunctiva/sclera: Conjunctivae normal.     Pupils: Pupils are equal, round, and reactive to light.  Neck:     Thyroid: No thyromegaly.     Vascular: No JVD.  Cardiovascular:     Rate and Rhythm: Normal rate and regular rhythm.     Heart sounds: Normal heart sounds. No murmur heard.    No friction rub. No gallop.  Pulmonary:     Effort: Pulmonary  effort is normal.     Breath sounds: Normal breath sounds. No wheezing, rhonchi or rales.  Abdominal:     General: Bowel sounds are normal.     Palpations: Abdomen is soft. There is no mass.      Tenderness: There is no abdominal tenderness. There is no guarding.  Musculoskeletal:        General: Normal range of motion.     Cervical back: Neck supple.  Lymphadenopathy:     Cervical: No cervical adenopathy.  Skin:    General: Skin is warm and dry.  Neurological:     Mental Status: She is alert.     Deep Tendon Reflexes: Reflexes are normal and symmetric.     Wt Readings from Last 3 Encounters:  12/14/22 133 lb (60.3 kg)  09/26/22 133 lb (60.3 kg)  08/21/22 134 lb (60.8 kg)    BP 110/68   Pulse 60   Ht 5\' 1"  (1.549 m)   Wt 133 lb (60.3 kg)   SpO2 98%   BMI 25.13 kg/m   Assessment and Plan: 1. Diabetes mellitus treated with oral medication (HCC) Chronic.  Controlled.  Stable.  Continue current medication regimen including glipizide XL 10 mg once a day, metformin 1 g twice a day, and pioglitazone 15 mg once a day.  Will check A1c and microalbuminuria for current status of control.  Will recheck in 4 months. - HgB A1c - Microalbumin / creatinine urine ratio - glipiZIDE (GLUCOTROL XL) 10 MG 24 hr tablet; Take 1 tablet (10 mg total) by mouth daily.  Dispense: 90 tablet; Refill: 1 - metFORMIN (GLUCOPHAGE) 1000 MG tablet; Take 1 tablet (1,000 mg total) by mouth 2 (two) times daily with a meal.  Dispense: 180 tablet; Refill: 1 - pioglitazone (ACTOS) 15 MG tablet; Take 1 tablet (15 mg total) by mouth daily.  Dispense: 90 tablet; Refill: 1     Elizabeth Sauer, MD

## 2022-12-16 ENCOUNTER — Encounter: Payer: Self-pay | Admitting: Family Medicine

## 2022-12-16 LAB — HEMOGLOBIN A1C
Est. average glucose Bld gHb Est-mCnc: 140 mg/dL
Hgb A1c MFr Bld: 6.5 % — ABNORMAL HIGH (ref 4.8–5.6)

## 2022-12-16 LAB — MICROALBUMIN / CREATININE URINE RATIO
Creatinine, Urine: 155.4 mg/dL
Microalb/Creat Ratio: 18 mg/g{creat} (ref 0–29)
Microalbumin, Urine: 28.2 ug/mL

## 2022-12-17 ENCOUNTER — Other Ambulatory Visit: Payer: Self-pay | Admitting: Family Medicine

## 2022-12-17 DIAGNOSIS — J301 Allergic rhinitis due to pollen: Secondary | ICD-10-CM

## 2022-12-27 ENCOUNTER — Other Ambulatory Visit: Payer: Self-pay | Admitting: Family Medicine

## 2022-12-27 DIAGNOSIS — E1140 Type 2 diabetes mellitus with diabetic neuropathy, unspecified: Secondary | ICD-10-CM

## 2023-01-07 ENCOUNTER — Other Ambulatory Visit: Payer: Self-pay | Admitting: Family Medicine

## 2023-01-07 DIAGNOSIS — K219 Gastro-esophageal reflux disease without esophagitis: Secondary | ICD-10-CM

## 2023-01-07 DIAGNOSIS — R1319 Other dysphagia: Secondary | ICD-10-CM

## 2023-01-08 NOTE — Telephone Encounter (Signed)
Requested Prescriptions  Pending Prescriptions Disp Refills   famotidine (PEPCID) 40 MG tablet [Pharmacy Med Name: FAMOTIDINE 40 MG TABLET] 90 tablet 1    Sig: TAKE 1 TABLET BY MOUTH EVERY DAY     Gastroenterology:  H2 Antagonists Passed - 01/07/2023  1:12 AM      Passed - Valid encounter within last 12 months    Recent Outpatient Visits           3 weeks ago Diabetes mellitus treated with oral medication (HCC)   [place] [facility]   5 months ago Type 2 diabetes mellitus with diabetic neuropathy, without long-term current use of insulin (HCC)   [place] [facility] MD   6 months ago Essential hypertension   [place] [facility] MD   7 months ago Essential hypertension   [place] [facility] MD   9 months ago Essential hypertension   [place] [facility]       Future Appointments             In 3 months Duanne Limerick, MD [facility] at [facility], [facility]

## 2023-01-11 ENCOUNTER — Other Ambulatory Visit: Payer: Self-pay | Admitting: Family Medicine

## 2023-01-14 NOTE — Telephone Encounter (Signed)
Requested medication (s) are due for refill today: yes  Requested medication (s) are on the active medication list: yes  Last refill:  04/02/22  Future visit scheduled: yes  Notes to clinic:   Medication not assigned to a protocol, review manually.      Requested Prescriptions  Pending Prescriptions Disp Refills   TRELEGY ELLIPTA 100-62.5-25 MCG/ACT AEPB [Pharmacy Med Name: TRELEGY ELLIPTA 100-62.5-25] 60 each 1    Sig: INHALE 1 PUFF BY MOUTH EVERY DAY     Off-Protocol Failed - 01/11/2023  2:50 PM      Failed - Medication not assigned to a protocol, review manually.      Passed - Valid encounter within last 12 months    Recent Outpatient Visits           1 month ago Diabetes mellitus treated with oral medication (HCC)   [place] [facility]   5 months ago Type 2 diabetes mellitus with diabetic neuropathy, without long-term current use of insulin (HCC)   [place] [facility]   6 months ago Essential hypertension   [place] [facility]   8 months ago Essential hypertension   [place] [facility] MD   9 months ago Essential hypertension   [place] [facility], MD       Future Appointments             In 3 months Duanne Limerick, MD [facility] at [facility]

## 2023-01-21 ENCOUNTER — Encounter: Payer: Self-pay | Admitting: Family Medicine

## 2023-01-21 ENCOUNTER — Ambulatory Visit (INDEPENDENT_AMBULATORY_CARE_PROVIDER_SITE_OTHER): Payer: 59 | Admitting: Family Medicine

## 2023-01-21 ENCOUNTER — Ambulatory Visit: Payer: Self-pay

## 2023-01-21 VITALS — BP 118/64 | HR 80 | Temp 98.2°F | Ht 61.0 in | Wt 139.0 lb

## 2023-01-21 DIAGNOSIS — R059 Cough, unspecified: Secondary | ICD-10-CM

## 2023-01-21 DIAGNOSIS — L0201 Cutaneous abscess of face: Secondary | ICD-10-CM

## 2023-01-21 DIAGNOSIS — J449 Chronic obstructive pulmonary disease, unspecified: Secondary | ICD-10-CM | POA: Diagnosis not present

## 2023-01-21 DIAGNOSIS — Z22322 Carrier or suspected carrier of Methicillin resistant Staphylococcus aureus: Secondary | ICD-10-CM

## 2023-01-21 DIAGNOSIS — L03114 Cellulitis of left upper limb: Secondary | ICD-10-CM | POA: Diagnosis not present

## 2023-01-21 LAB — POCT INFLUENZA A/B
Influenza A, POC: NEGATIVE
Influenza B, POC: NEGATIVE

## 2023-01-21 LAB — POC COVID19 BINAXNOW: SARS Coronavirus 2 Ag: NEGATIVE

## 2023-01-21 MED ORDER — MUPIROCIN 2 % EX OINT
1.0000 | TOPICAL_OINTMENT | Freq: Two times a day (BID) | CUTANEOUS | 0 refills | Status: DC
Start: 2023-01-21 — End: 2023-07-20

## 2023-01-21 MED ORDER — AMOXICILLIN-POT CLAVULANATE 875-125 MG PO TABS
1.0000 | ORAL_TABLET | Freq: Two times a day (BID) | ORAL | 0 refills | Status: DC
Start: 2023-01-21 — End: 2023-06-11

## 2023-01-21 MED ORDER — ALBUTEROL SULFATE HFA 108 (90 BASE) MCG/ACT IN AERS
INHALATION_SPRAY | RESPIRATORY_TRACT | 2 refills | Status: DC
Start: 2023-01-21 — End: 2023-04-19

## 2023-01-21 MED ORDER — TRELEGY ELLIPTA 100-62.5-25 MCG/ACT IN AEPB: INHALATION_SPRAY | RESPIRATORY_TRACT | 5 refills | Status: DC

## 2023-01-21 MED ORDER — TRELEGY ELLIPTA 100-62.5-25 MCG/ACT IN AEPB
INHALATION_SPRAY | RESPIRATORY_TRACT | 0 refills | Status: DC
Start: 2023-01-21 — End: 2023-01-21

## 2023-01-21 NOTE — Telephone Encounter (Signed)
  Chief Complaint: 2 boils on body, cough and congestion Symptoms: above Frequency: weds last week Pertinent Negatives: Patient denies  Disposition: [] ED /[] Urgent Care (no appt availability in office) / [x] Appointment(In office/virtual)/ []  [place] [facility]/ [] Home Care/ [] Refused Recommended Disposition /[] [facility]/ []  Follow-up with PCP Additional Notes: PT states she has 2 boils that started last week and are getting bigger. Pt has a hx of MRSA. Pt also has a dry cough, yet also states she is bringing up more phlegm that usual.   Reason for Disposition  [1] Boil > 1/2 inch across (> 12 mm; larger than a marble) AND [2] center is soft or pus colored  Answer Assessment - Initial Assessment Questions 1. APPEARANCE of BOIL: "What does the boil look like?"      Lump red - filled 2. LOCATION: "Where is the boil located?"      Elbow and lip 3. NUMBER: "How many boils are there?"      2 4. SIZE: "How big is the boil?" (e.g., inches, cm; compare to size of a coin or other object)     inch 5. ONSET: "When did the boil start?"     Last week 6. PAIN: "Is there any pain?" If Yes, ask: "How bad is the pain?"   (Scale 1-10; or mild, moderate, severe)     9/10, 6/10 7. FEVER: "Do you have a fever?" If Yes, ask: "What is it, how was it measured, and when did it start?"      chills 8. SOURCE: "Have you been around anyone with boils or other Staph infections?" "Have you ever had boils before?"     yes 9. OTHER SYMPTOMS: "Do you have any other symptoms?" (e.g., shaking chills, weakness, rash elsewhere on body)     weakness  Protocols used: Boil (Skin Abscess)-A-AH

## 2023-01-21 NOTE — Progress Notes (Signed)
Date:  01/21/2023   Name:  Mary Nicholson   DOB:  [birth year]   MRN:  [id number]   Chief Complaint: Cough (X 1 week, dry cough, can't cough up mucous, chills, SOB, runny nose, sneezing, wheezing sore, no covid test ) and Mass (X 6 days, getting bigger, on lip and left elbow, painful )  Cough This is a new problem. The current episode started yesterday. The problem has been gradually improving. The cough is Productive of blood-tinged sputum. Associated symptoms include a rash. Pertinent negatives include no chest pain, ear congestion, ear pain, fever, myalgias, postnasal drip, shortness of breath, sweats or wheezing. She has tried a beta-agonist inhaler for the symptoms. The treatment provided mild relief.  Rash Chronicity: for cellulitis. The affected locations include the face and left elbow. The rash is characterized by redness and draining. Associated symptoms include coughing. Pertinent negatives include no congestion, fever or shortness of breath. The treatment provided mild relief.    Lab Results  Component Value Date   NA 138 04/02/2022   K 4.2 04/02/2022   CO2 21 04/02/2022   GLUCOSE 125 (H) 04/02/2022   BUN 23 04/02/2022   CREATININE 1.00 04/02/2022   CALCIUM 9.8 04/02/2022   EGFR 61 04/02/2022   GFRNONAA >60 10/31/2021   Lab Results  Component Value Date   CHOL 152 11/20/2021   HDL 54 11/20/2021   LDLCALC 68 11/20/2021   TRIG 181 (H) 11/20/2021   Lab Results  Component Value Date   TSH 3.500 04/02/2022   Lab Results  Component Value Date   HGBA1C 6.5 (H) 12/14/2022   Lab Results  Component Value Date   WBC 7.4 10/31/2021   HGB 14.0 10/31/2021   HCT 42.8 10/31/2021   MCV 91.1 10/31/2021   PLT 359 10/31/2021   Lab Results  Component Value Date   ALT 15 08/03/2022   AST 13 08/03/2022   ALKPHOS 94 08/03/2022   BILITOT 0.3 08/03/2022   No results found for: "25OHVITD2", "25OHVITD3", "VD25OH"   Review of Systems  Constitutional:  Negative for  fever.  HENT:  Negative for congestion, ear pain and postnasal drip.   Respiratory:  Positive for cough. Negative for chest tightness, shortness of breath and wheezing.   Cardiovascular:  Negative for chest pain, palpitations and leg swelling.  Musculoskeletal:  Negative for myalgias.  Skin:  Positive for rash.    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   Chronic radicular lumbar pain 05/04/2021   History of lumbar fusion 05/04/2021   Chronic pain syndrome 05/04/2021   Bipolar affective disorder, mixed (HCC) 11/26/2019   COPD (chronic obstructive pulmonary disease) (HCC) 11/26/2019   PTSD (post-traumatic stress disorder) 11/26/2019   Schizophrenia (HCC) 11/26/2019   Abdominal wall cellulitis 11/12/2019   Abdominal wall abscess    Type 2 diabetes mellitus with diabetic neuropathy, without long-term current use of insulin (HCC)    Hyperlipidemia    Bilateral hand numbness 09/16/2019   S/P hysterectomy 09/09/2019   Stage 3b chronic kidney disease (HCC) 09/09/2019   Cervical radiculitis 08/13/2019   Chronic bilateral low back pain with right-sided sciatica 08/13/2019   Lumbar radiculopathy 08/13/2019   Neck pain 08/13/2019   Numbness and tingling of both feet 07/30/2019   Dupuytren contracture 05/22/2019   Personal history of transient ischemic attack (  TIA), and cerebral infarction without residual deficits 05/22/2019   Resting tremor 05/22/2019   Diverticulosis 05/20/2019   External hemorrhoid 05/20/2019   Abnormal cervical Papanicolaou smear 12/19/2018   Arthralgia of both hands 12/19/2018   Breast pain, right 12/19/2018   Diarrhea 12/19/2018   Dysphagia 12/19/2018   Paresthesias 12/19/2018   Seasonal allergies 12/19/2018   Gastroesophageal reflux disease 09/23/2015   Spinal stenosis of lumbar region 05/04/2015   Allergic  rhinitis 11/28/2014   Acquired hypothyroidism 09/13/2014   Essential hypertension 09/13/2014   Type 2 diabetes mellitus with diabetic neuropathy (HCC) 09/13/2014    No Known Allergies  Past Surgical History:  Procedure Laterality Date   ABDOMINAL HYSTERECTOMY     BACK SURGERY     BREAST BIOPSY Right    negative 1988   BREAST EXCISIONAL BIOPSY Right 1988   CHOLECYSTECTOMY     ESOPHAGOGASTRODUODENOSCOPY (EGD) WITH PROPOFOL N/A 08/08/2021   Procedure: ESOPHAGOGASTRODUODENOSCOPY (EGD) WITH PROPOFOL;  Surgeon: Midge Minium, MD;  Location: [facility];  Service: Endoscopy;  Laterality: N/A;   THORACIC LAMINECTOMY FOR SPINAL CORD STIMULATOR N/A 11/08/2021   Procedure: THORACIC LAMINECTOMY FOR SPINAL CORD STIMULATOR IMPLANT (MEDTRONIC);  Surgeon: Venetia Night, MD;  Location: [facility] ORS;  Service: Neurosurgery;  Laterality: N/A;   TUBAL LIGATION      Social History   Tobacco Use   Smoking status: Every Day    Current packs/day: 1.00    Average packs/day: 1 pack/day for 57.0 years (57.0 ttl pk-yrs)    Types: Cigarettes   Smokeless tobacco: Never   Tobacco comments:    Has cut back to 1/2 PPD  Vaping Use   Vaping status: Never Used  Substance Use Topics   Alcohol use: Yes    Comment: Occasional   Drug use: Never     Medication list has been reviewed and updated.  Current Meds  Medication Sig   Acetaminophen (TYLENOL ARTHRITIS EXT RELIEF PO) Take 1 capsule by mouth 3 (three) times daily.   albuterol (VENTOLIN HFA) 108 (90 Base) MCG/ACT inhaler INHALE 1-2 PUFFS BY MOUTH EVERY 6 HOURS AS NEEDED FOR WHEEZE OR SHORTNESS OF BREATH   aspirin 81 MG chewable tablet Chew by mouth.   atorvastatin (LIPITOR) 80 MG tablet TAKE 1 TABLET BY MOUTH EVERY DAY   b complex vitamins capsule Take 1 capsule by mouth daily.   Biotin 5 MG CAPS Take 1 capsule by mouth daily.   busPIRone (BUSPAR) 15 MG tablet TAKE 1 TABLET BY MOUTH 3 TIMES DAILY.   carboxymethylcellulose (REFRESH PLUS) 0.5 %  SOLN Place 1 drop into both eyes daily. 1 drop in left eye BiD and 1 drop to right eye daily   famotidine (PEPCID) 40 MG tablet TAKE 1 TABLET BY MOUTH EVERY DAY   fluticasone (FLONASE) 50 MCG/ACT nasal spray SPRAY 2 SPRAYS INTO EACH NOSTRIL EVERY DAY   glipiZIDE (GLUCOTROL XL) 10 MG 24 hr tablet Take 1 tablet (10 mg total) by mouth daily.   hydrochlorothiazide (HYDRODIURIL) 25 MG tablet Take 25 mg by mouth daily.   hydrOXYzine (ATARAX) 25 MG tablet Take 25 mg by mouth 2 (two) times daily.   lamoTRIgine (LAMICTAL) 100 MG tablet Take 1 tablet (100 mg total) by mouth 2 (two) times daily. cbc   levothyroxine (SYNTHROID) 100 MCG tablet TAKE 1 TABLET BY MOUTH EVERY DAY   metFORMIN (GLUCOPHAGE) 1000 MG tablet Take 1 tablet (1,000 mg total) by mouth 2 (two) times daily with a meal.   metoprolol succinate (TOPROL-XL) 50 MG 24 hr  tablet Take 50 mg by mouth daily.   Multiple Vitamins-Minerals (CENTRUM SILVER PO) Take 1 each by mouth daily.   neomycin-polymyxin b-dexamethasone (MAXITROL) 3.5-10000-0.1 SUSP    OneTouch Delica Lancets 33G MISC USE 1 EACH ONCE DAILY USE AS INSTRUCTED.   ONETOUCH ULTRA test strip 3 (three) times daily.   oxybutynin (DITROPAN-XL) 5 MG 24 hr tablet TAKE 1 TABLET BY MOUTH EVERYDAY AT BEDTIME   pioglitazone (ACTOS) 15 MG tablet Take 1 tablet (15 mg total) by mouth daily.   REXULTI 0.5 MG TABS Take 1 tablet by mouth daily.   traZODone (DESYREL) 50 MG tablet Take 50 mg by mouth at bedtime as needed.   venlafaxine XR (EFFEXOR-XR) 75 MG 24 hr capsule Take 2 capsules (150 mg total) by mouth in the morning and at bedtime.       01/21/2023   11:19 AM 12/14/2022   10:37 AM 08/03/2022   10:32 AM 06/22/2022    2:49 PM  GAD 7 : Generalized Anxiety Score  Nervous, Anxious, on Edge  0 3 3  Control/stop worrying 2 0 3 3  Worry too much - different things 2 0 3 3  Trouble relaxing 0 0 1 3  Restless 0 0 1 3  Easily annoyed or irritable 2 0 2 3  Afraid - awful might happen 0 0 2 3  Total  GAD 7 Score  0 15 21  Anxiety Difficulty Not difficult at all Not difficult at all Very difficult Extremely difficult       01/21/2023   11:18 AM 12/14/2022   10:37 AM 09/26/2022    3:05 PM  Depression screen PHQ 2/9  Decreased Interest 0 0 1  Down, Depressed, Hopeless 0 0 1  PHQ - 2 Score 0 0 2  Altered sleeping 0 0 1  Tired, decreased energy 0 0 1  Change in appetite 0 0 0  Feeling bad or failure about yourself  0 0 0  Trouble concentrating 0 0 0  Moving slowly or fidgety/restless 0 0 0  Suicidal thoughts 0 0 0  PHQ-9 Score 0 0 4  Difficult doing work/chores Not difficult at all Not difficult at all Somewhat difficult    BP Readings from Last 3 Encounters:  01/21/23 118/64  12/14/22 110/68  08/25/22 130/87    Physical Exam Vitals and nursing note reviewed. Exam conducted with a chaperone present.  Constitutional:      General: She is not in acute distress.    Appearance: She is not diaphoretic.  HENT:     Head: Normocephalic and atraumatic.      Right Ear: Tympanic membrane and external ear normal.     Left Ear: Tympanic membrane and external ear normal.     Nose: Nose normal.     Mouth/Throat:     Mouth: Mucous membranes are moist.     Pharynx: Oropharynx is clear. No oropharyngeal exudate.  Eyes:     General:        Right eye: No discharge.        Left eye: No discharge.     Conjunctiva/sclera: Conjunctivae normal.     Pupils: Pupils are equal, round, and reactive to light.  Neck:     Thyroid: No thyromegaly.     Vascular: No JVD.  Cardiovascular:     Rate and Rhythm: Normal rate and regular rhythm.     Heart sounds: Normal heart sounds. No murmur heard.    No friction rub. No gallop.  Pulmonary:  Effort: Pulmonary effort is normal.     Breath sounds: Normal breath sounds. No wheezing, rhonchi or rales.  Abdominal:     General: Bowel sounds are normal.     Palpations: Abdomen is soft. There is no mass.     Tenderness: There is no abdominal tenderness.  There is no guarding.  Musculoskeletal:        General: Normal range of motion.     Cervical back: Normal range of motion and neck supple.  Lymphadenopathy:     Head:     Left side of head: Submandibular adenopathy present.     Cervical: No cervical adenopathy.     Left cervical: No superficial, deep or posterior cervical adenopathy.     Comments: Tenderness of submandibular lymph node on the left.  Skin:    General: Skin is warm and dry.     Findings: Wound present.          Comments: Above the left lip is a thin surface abscess which appears to have some purulence beneath the surface.  There is tenderness around with erythema and a palpable submandibular lymph node.  But patient also has as well an area on her left elbow consistent with a cellulitis or an abscess that she tried to open herself but which has progressed.  Neurological:     Mental Status: She is alert.     Deep Tendon Reflexes: Reflexes are normal and symmetric.     Wt Readings from Last 3 Encounters:  01/21/23 139 lb (63 kg)  12/14/22 133 lb (60.3 kg)  09/26/22 133 lb (60.3 kg)    BP 118/64   Pulse 80   Temp 98.2 F (36.8 C) (Oral)   Ht 5\' 1"  (1.549 m)   Wt 139 lb (63 kg)   SpO2 95%   BMI 26.26 kg/m   Assessment and Plan: 1. Cough, unspecified type New onset.  Persistent.  Stable.  Patient has been taking albuterol with some success but she has been out of her Trelegy.  This is nonproductive and auscultation and percussion of the lungs are unremarkable.  Influenza and COVID were checked and they were noted to be negative.  I think this is either a viral exacerbation but patient has underlying COPD that needs maintenance long-acting beta agonist and anticholinergic and and steroid. - POCT Influenza A/B - POC COVID-19  2. Chronic obstructive pulmonary disease, unspecified COPD type (HCC) Chronic.  Acute exacerbation.  We will refill Trelegy and patient will initiate. - albuterol (VENTOLIN HFA) 108 (90  Base) MCG/ACT inhaler; INHALE 1-2 PUFFS BY MOUTH EVERY 6 HOURS AS NEEDED FOR WHEEZE OR SHORTNESS OF BREATH  Dispense: 3 each; Refill: 2 - Fluticasone-Umeclidin-Vilant (TRELEGY ELLIPTA) 100-62.5-25 MCG/ACT AEPB; INHALE 1 PUFF BY MOUTH EVERY DAY  Dispense: 60 each; Refill: 0  3. Cellulitis of left upper extremity New onset.  Persistent.  Patient had an abscess that she opened on her own which now has a surrounding cellulitis with minimal drainage looking more like a carbuncle.  I am not sure if it is adequately draining and we are sending to a surgeon to take a look at her face and if necessary further opening.  In the meantime we will initiate Augmentin 875 mg twice a day.  4. Abscess of face Patient has a surface abscess above her lip it is consistent with carbuncle and there is a palpable lymph node that is tender submandibular.  As noted above we will initiate Augmentin 875 mg twice a  day and Bactroban to be applied to the cellulitic area on the elbow and contacted general surgery to see if they could unroofed in size the small facial abscess. - Ambulatory referral to General Surgery - mupirocin ointment (BACTROBAN) 2 %; Apply 1 Application topically 2 (two) times daily.  Dispense: 22 g; Refill: 0 - amoxicillin-clavulanate (AUGMENTIN) 875-125 MG tablet; Take 1 tablet by mouth 2 (two) times daily.  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 - mupirocin ointment (BACTROBAN) 2 %; Apply 1 Application topically 2 (two) times daily.  Dispense: 22 g; Refill: 0 - amoxicillin-clavulanate (AUGMENTIN) 875-125 MG tablet; Take 1 tablet by mouth 2 (two) times daily.  Dispense: 20 tablet; Refill: 0     Elizabeth Sauer, MD

## 2023-01-24 ENCOUNTER — Encounter: Payer: Self-pay | Admitting: General Surgery

## 2023-01-24 ENCOUNTER — Ambulatory Visit: Payer: 59 | Admitting: General Surgery

## 2023-01-24 VITALS — BP 204/77 | HR 82 | Temp 98.8°F | Ht 61.0 in | Wt 138.2 lb

## 2023-01-24 DIAGNOSIS — L02414 Cutaneous abscess of left upper limb: Secondary | ICD-10-CM | POA: Diagnosis not present

## 2023-01-24 DIAGNOSIS — L0201 Cutaneous abscess of face: Secondary | ICD-10-CM | POA: Diagnosis not present

## 2023-01-24 NOTE — Patient Instructions (Signed)
MRSA Infection, Self-Care, Adult Methicillin-resistant Staphylococcus aureus (MRSA) infection is caused by bacteria that no longer respond to antibiotic medicines. MRSA infection can be hard to treat. Self-care is important after being diagnosed with MRSA. Following instructions for self-care will: Help you heal well. Prevent the infection from spreading to others. Your health care provider may also give you more specific instructions. If you have problems or questions, contact your health care provider. What are the risks? MRSA can be on the skin or in the nose of many people without causing problems. This is called MRSA colonization. However, MRSA can cause problems for you and others if it enters the body through a cut, a sore, or an invasive medical procedure or device. If MRSA enters your body, it may cause serious problems, such as: Skin infections. Bone or joint infections. Pneumonia. Bloodstream infections (sepsis). If you have these infections, MRSA may spread to others, including: Visitors or other patients in the hospital. Friends, family, or other people at home or in your community. Supplies needed: Soap and water. Germ-free (sterile) dressing. Alcohol-based hand sanitizer (optional). Home cleaning solutions that contain bleach. Clean or disposable towels. How to prevent MRSA infections Take these steps to avoid getting another MRSA infection and to prevent the bacteria from spreading to others. Hand washing  Wash your hands frequently with soap and water. If soap and water are not available, use an alcohol-based hand sanitizer. Dry your hands with a clean or disposable towel. Make sure that everyone in the household washes their hands often. Wound care  If you have a wound, follow instructions from your health care provider about how to take care of your wound. Make sure you: Wash your hands with soap and water before and after you change your bandage (dressing). If soap  and water are not available, use an alcohol-based hand sanitizer. Change your dressing as told by your health care provider. Leave any stitches (sutures), skin glue, or adhesive strips in place. These skin closures may need to be in place for 2 weeks or longer. If adhesive strip edges start to loosen and curl up, you may trim the loose edges. Do not remove adhesive strips completely unless your health care provider tells you to do that. Clean wounds, cuts, and abrasions with soap and water and cover them with dry, sterile dressings until they heal. Check your wound every day for signs of infection. Check for: More redness, swelling, or pain. More fluid or blood. Warmth. Pus or a bad smell. If you have a wound that seems to be infected, ask your health care provider if a culture should be done for MRSA and other bacteria. Personal hygiene Maintain good hygiene by bathing often and keeping your body clean. Wash hands before preparing food. Always shower after exercising. Wash towels, bedding, and clothes in the washing machine with detergent and hot water. Dry them in a hot dryer. General tips Take your antibiotic medicine as told by your health care provider. Take them only when absolutely necessary. Do not stop taking the antibiotic even if you start to feel better. Avoid close contact with others as much as possible. Do not use towels, razors, toothbrushes, bedding, or other items that will be used by others. Clean surfaces regularly to remove germs (disinfection). Use products or solutions that contain bleach. Make sure you disinfect bathroom surfaces, food preparation areas, and doorknobs. Follow these instructions at home: Take over-the-counter and prescription medicines only as told by your health care provider. Tell all health  care providers who care for you that you have MRSA or that you have had MRSA. If you are breastfeeding, talk to your health care provider about MRSA. You may be  asked to temporarily stop breastfeeding. If you have an invasive medical device, make sure that you know how to take care of it to prevent infection. Keep all follow-up visits as told by your health care provider. This is important. Contact a health care provider if: Your infection seems to be getting worse. Signs may include: Warmth, redness, or tenderness around your wound site. A red line that spreads from your infection site. A dark color in the area around your infection. Wound drainage that is tan, yellow, or green. A bad smell coming from your wound. You have symptoms of a new MRSA infection: A pus-filled pimple. A boil on your skin. Pus draining from your skin. A sore (abscess) under your skin or somewhere in your body. Fever with or without chills. Get help right away if: You have trouble breathing. You feel nauseous, you vomit, or you cannot take medicine without vomiting. You have chest pain. These symptoms may represent a serious problem that is an emergency. Do not wait to see if the symptoms will go away. Get medical help right away. Call your local emergency services (911 in the [place] Do not drive yourself to the hospital. Summary Self-care is important after being diagnosed with MRSA. This will help you heal well and prevent the infection from spreading to others. Wash your hands often, avoid close contact with others, and avoid sharing towels, razors, toothbrushes, and bedding with other people. This will help prevent the infection from spreading to others. If you are being treated for a MRSA infection, make sure to take over-the-counter and prescription medicines as told. Finish all antibiotic medicine even if you start to feel better. If you have a wound, follow instructions from your health care provider about how to take care of it. Check your wound every day for signs of infection. This information is not intended to replace advice given to you by your health care  provider. Make sure you discuss any questions you have with your health care provider. Document Revised: 02/22/2022 Document Reviewed: 02/22/2022 Elsevier Patient Education  2024 [employer].

## 2023-01-25 NOTE — Progress Notes (Signed)
Patient ID: Mary Nicholson, female   DOB: 1955-12-25, 67 y.o.   MRN: [id number] CC: Facial Abscess/Elbow abscess  History of Present Illness Mary Nicholson is a 67 y.o. female who is a clinic for facial abscess as well as left elbow abscess.  The patient reports that approximately 6 days ago she noticed some swelling right under her left lip.  This has progressively increased in pain and has gotten a little bit bigger.  She denies any has had any drainage.  She also reports that she has had a wound on her left elbow since this time.  The left elbow started to drain spontaneously.  Both of the areas have surrounding redness and this is associated with a cough.  She denies any fever.  She went to her primary care physician who prescribed her antibiotics and referred her to our clinic.  Past Medical History Past Medical History:  Diagnosis Date   Anemia    Arthritis    Bipolar 1 disorder (HCC)    Cancer (HCC)    cervical   Chronic kidney disease    COPD (chronic obstructive pulmonary disease) (HCC)    Diabetes mellitus without complication (HCC)    Dyspnea    GERD (gastroesophageal reflux disease)    History of kidney stones    Hx of right breast biopsy 1988   RIGHT excisional bx 1988   Hyperlipidemia    Hypertension    Hypothyroidism    Pneumonia    PTSD (post-traumatic stress disorder)    Schizophrenia (HCC)    Spinal stenosis    Stroke (HCC)    mild 2013   Thyroid disease       Past Surgical History:  Procedure Laterality Date   ABDOMINAL HYSTERECTOMY     BACK SURGERY     BREAST BIOPSY Right    negative 1988   BREAST EXCISIONAL BIOPSY Right 1988   CHOLECYSTECTOMY     ESOPHAGOGASTRODUODENOSCOPY (EGD) WITH PROPOFOL N/A 08/08/2021   Procedure: ESOPHAGOGASTRODUODENOSCOPY (EGD) WITH PROPOFOL;  Surgeon: Midge Minium, MD;  Location: [facility];  Service: Endoscopy;  Laterality: N/A;   THORACIC LAMINECTOMY FOR SPINAL CORD STIMULATOR N/A 11/08/2021   Procedure:  THORACIC LAMINECTOMY FOR SPINAL CORD STIMULATOR IMPLANT (MEDTRONIC);  Surgeon: Venetia Night, MD;  Location: [facility];  Service: Neurosurgery;  Laterality: N/A;   TUBAL LIGATION      No Known Allergies  Current Outpatient Medications  Medication Sig Dispense Refill   Acetaminophen (TYLENOL ARTHRITIS EXT RELIEF PO) Take 1 capsule by mouth 3 (three) times daily.     albuterol (VENTOLIN HFA) 108 (90 Base) MCG/ACT inhaler INHALE 1-2 PUFFS BY MOUTH EVERY 6 HOURS AS NEEDED FOR WHEEZE OR SHORTNESS OF BREATH 3 each 2   amoxicillin-clavulanate (AUGMENTIN) 875-125 MG tablet Take 1 tablet by mouth 2 (two) times daily. 20 tablet 0   aspirin 81 MG chewable tablet Chew by mouth.     atorvastatin (LIPITOR) 80 MG tablet TAKE 1 TABLET BY MOUTH EVERY DAY 90 tablet 0   b complex vitamins capsule Take 1 capsule by mouth daily.     Biotin 5 MG CAPS Take 1 capsule by mouth daily.     busPIRone (BUSPAR) 15 MG tablet TAKE 1 TABLET BY MOUTH 3 TIMES DAILY. 45 tablet 0   carboxymethylcellulose (REFRESH PLUS) 0.5 % SOLN Place 1 drop into both eyes daily. 1 drop in left eye BiD and 1 drop to right eye daily     famotidine (PEPCID) 40 MG tablet TAKE 1 TABLET  BY MOUTH EVERY DAY 90 tablet 1   fluticasone (FLONASE) 50 MCG/ACT nasal spray SPRAY 2 SPRAYS INTO EACH NOSTRIL EVERY DAY 48 mL 0   Fluticasone-Umeclidin-Vilant (TRELEGY ELLIPTA) 100-62.5-25 MCG/ACT AEPB INHALE 1 PUFF BY MOUTH EVERY DAY 60 each 5   glipiZIDE (GLUCOTROL XL) 10 MG 24 hr tablet Take 1 tablet (10 mg total) by mouth daily. 90 tablet 1   hydrochlorothiazide (HYDRODIURIL) 25 MG tablet Take 25 mg by mouth daily.     hydrOXYzine (ATARAX) 25 MG tablet Take 25 mg by mouth 2 (two) times daily.     lamoTRIgine (LAMICTAL) 100 MG tablet Take 1 tablet (100 mg total) by mouth 2 (two) times daily. cbc 60 tablet 0   levothyroxine (SYNTHROID) 100 MCG tablet TAKE 1 TABLET BY MOUTH EVERY DAY 90 tablet 1   metFORMIN (GLUCOPHAGE) 1000 MG tablet Take 1 tablet (1,000 mg  total) by mouth 2 (two) times daily with a meal. 180 tablet 1   metoprolol succinate (TOPROL-XL) 50 MG 24 hr tablet Take 50 mg by mouth daily.     Multiple Vitamins-Minerals (CENTRUM SILVER PO) Take 1 each by mouth daily.     mupirocin ointment (BACTROBAN) 2 % Apply 1 Application topically 2 (two) times daily. 22 g 0   neomycin-polymyxin b-dexamethasone (MAXITROL) 3.5-10000-0.1 SUSP      OneTouch Delica Lancets 33G MISC USE 1 EACH ONCE DAILY USE AS INSTRUCTED. 100 each 11   ONETOUCH ULTRA test strip 3 (three) times daily.     oxybutynin (DITROPAN-XL) 5 MG 24 hr tablet TAKE 1 TABLET BY MOUTH EVERYDAY AT BEDTIME 90 tablet 3   pioglitazone (ACTOS) 15 MG tablet Take 1 tablet (15 mg total) by mouth daily. 90 tablet 1   REXULTI 0.5 MG TABS Take 1 tablet by mouth daily.     traZODone (DESYREL) 50 MG tablet Take 50 mg by mouth at bedtime as needed.     venlafaxine XR (EFFEXOR-XR) 75 MG 24 hr capsule Take 2 capsules (150 mg total) by mouth in the morning and at bedtime. 120 capsule 0   No current facility-administered medications for this visit.    Family History Family History  Problem Relation Age of Onset   Diabetes Mother    Heart failure Mother    Ovarian cancer Mother 24       question dx?   Alzheimer's disease Father    COPD Father    Breast cancer Neg Hx       Social History Social History   Tobacco Use   Smoking status: Every Day    Current packs/day: 1.00    Average packs/day: 1 pack/day for 57.0 years (57.0 ttl pk-yrs)    Types: Cigarettes   Smokeless tobacco: Never   Tobacco comments:    Has cut back to 1/2 PPD  Vaping Use   Vaping status: Never Used  Substance Use Topics   Alcohol use: Yes    Comment: Occasional   Drug use: Never        ROS Full ROS of systems performed and is otherwise negative there than what is stated in the HPI  Physical Exam Blood pressure (!) 204/77, pulse 82, temperature 98.8 F (37.1 C), temperature source Oral, height 5\' 1"  (1.549  m), weight 138 lb 3.2 oz (62.7 kg), SpO2 95%.  She is in no acute.  She has normal work of breathing on room air.  On her face there is a raised swelling that does appear to have purulence within it.  This  is located just superior to her left lip.  On her left elbow there is a wound with overlying denuded skin.  The surrounding skin is erythematous and upon palpation there is expressed purulence Data Reviewed I have personally reviewed the patient's imaging and medical records.    Assessment/plan    67 year old female with likely MRSA infection of her left elbow as well as a infection of her left face.  I discussed with her that given the cosmetic outcome I am concerned about doing an I&D.  However, I think it is reasonable to aspirate the small amount of pus from the wound.  I also told her if that this persist she may need to visit an ENT or plastic surgeon to excise this area.  I discussed the risk, benefits and alternatives of I&D of the abscess including risk of continued infection and bleeding.  I also discussed the risk of poor cosmetic outcome.  She wishes to proceed with I&D   She should continue local wound care to her left elbow as it is draining spontaneously.  She should continue antibiotics as prescribed  Procedure note After informed consent was obtained the patient had her lip cleaned with alcohol solution.  A 25-gauge needle was inserted into the abscess and approximately 1 cc of pus was removed.  The wound was dressed with antibiotic cream and a Band-Aid.  She tolerated it well   Kandis Cocking 01/25/2023, 11:36 AM

## 2023-01-27 ENCOUNTER — Other Ambulatory Visit: Payer: Self-pay | Admitting: Family Medicine

## 2023-01-27 DIAGNOSIS — E7801 Familial hypercholesterolemia: Secondary | ICD-10-CM

## 2023-03-18 ENCOUNTER — Other Ambulatory Visit: Payer: Self-pay | Admitting: Family Medicine

## 2023-03-18 DIAGNOSIS — J301 Allergic rhinitis due to pollen: Secondary | ICD-10-CM

## 2023-04-08 ENCOUNTER — Other Ambulatory Visit: Payer: Self-pay | Admitting: Family Medicine

## 2023-04-08 DIAGNOSIS — F3160 Bipolar disorder, current episode mixed, unspecified: Secondary | ICD-10-CM

## 2023-04-08 DIAGNOSIS — F32A Depression, unspecified: Secondary | ICD-10-CM

## 2023-04-11 ENCOUNTER — Other Ambulatory Visit: Payer: Self-pay | Admitting: Family Medicine

## 2023-04-11 DIAGNOSIS — E039 Hypothyroidism, unspecified: Secondary | ICD-10-CM

## 2023-04-18 ENCOUNTER — Ambulatory Visit: Payer: 59 | Admitting: Family Medicine

## 2023-04-19 ENCOUNTER — Ambulatory Visit (INDEPENDENT_AMBULATORY_CARE_PROVIDER_SITE_OTHER): Payer: 59 | Admitting: Family Medicine

## 2023-04-19 ENCOUNTER — Encounter: Payer: Self-pay | Admitting: Family Medicine

## 2023-04-19 DIAGNOSIS — J449 Chronic obstructive pulmonary disease, unspecified: Secondary | ICD-10-CM | POA: Diagnosis not present

## 2023-04-19 DIAGNOSIS — F419 Anxiety disorder, unspecified: Secondary | ICD-10-CM

## 2023-04-19 DIAGNOSIS — F3160 Bipolar disorder, current episode mixed, unspecified: Secondary | ICD-10-CM

## 2023-04-19 DIAGNOSIS — R1319 Other dysphagia: Secondary | ICD-10-CM | POA: Diagnosis not present

## 2023-04-19 DIAGNOSIS — E7801 Familial hypercholesterolemia: Secondary | ICD-10-CM | POA: Diagnosis not present

## 2023-04-19 DIAGNOSIS — Z7984 Long term (current) use of oral hypoglycemic drugs: Secondary | ICD-10-CM

## 2023-04-19 DIAGNOSIS — K219 Gastro-esophageal reflux disease without esophagitis: Secondary | ICD-10-CM

## 2023-04-19 DIAGNOSIS — J301 Allergic rhinitis due to pollen: Secondary | ICD-10-CM

## 2023-04-19 DIAGNOSIS — N3281 Overactive bladder: Secondary | ICD-10-CM

## 2023-04-19 DIAGNOSIS — E119 Type 2 diabetes mellitus without complications: Secondary | ICD-10-CM

## 2023-04-19 DIAGNOSIS — E039 Hypothyroidism, unspecified: Secondary | ICD-10-CM

## 2023-04-19 MED ORDER — ATORVASTATIN CALCIUM 80 MG PO TABS: 80.0000 mg | ORAL_TABLET | Freq: Every day | ORAL | 1 refills | Status: DC

## 2023-04-19 MED ORDER — PIOGLITAZONE HCL 15 MG PO TABS: 15.0000 mg | ORAL_TABLET | Freq: Every day | ORAL | 1 refills | Status: DC

## 2023-04-19 MED ORDER — OXYBUTYNIN CHLORIDE ER 5 MG PO TB24: 5.0000 mg | ORAL_TABLET | Freq: Every day | ORAL | 3 refills | Status: DC

## 2023-04-19 MED ORDER — ALBUTEROL SULFATE HFA 108 (90 BASE) MCG/ACT IN AERS
INHALATION_SPRAY | RESPIRATORY_TRACT | 2 refills | Status: DC
Start: 2023-04-19 — End: 2023-10-15

## 2023-04-19 MED ORDER — VENLAFAXINE HCL ER 75 MG PO CP24: 150.0000 mg | ORAL_CAPSULE | Freq: Two times a day (BID) | ORAL | 5 refills | Status: DC

## 2023-04-19 MED ORDER — HYDROCHLOROTHIAZIDE 25 MG PO TABS: 25.0000 mg | ORAL_TABLET | Freq: Every day | ORAL | 1 refills | Status: DC

## 2023-04-19 MED ORDER — FAMOTIDINE 40 MG PO TABS: 40.0000 mg | ORAL_TABLET | Freq: Every day | ORAL | 1 refills | Status: DC

## 2023-04-19 MED ORDER — TRELEGY ELLIPTA 100-62.5-25 MCG/ACT IN AEPB: INHALATION_SPRAY | RESPIRATORY_TRACT | 5 refills | Status: DC

## 2023-04-19 MED ORDER — METFORMIN HCL 1000 MG PO TABS: 1000.0000 mg | ORAL_TABLET | Freq: Two times a day (BID) | ORAL | 1 refills | Status: DC

## 2023-04-19 MED ORDER — LAMOTRIGINE 100 MG PO TABS: 100.0000 mg | ORAL_TABLET | Freq: Two times a day (BID) | ORAL | 5 refills | Status: DC

## 2023-04-19 MED ORDER — GLIPIZIDE ER 10 MG PO TB24: 10.0000 mg | ORAL_TABLET | Freq: Every day | ORAL | 1 refills | Status: DC

## 2023-04-19 MED ORDER — FLUTICASONE PROPIONATE 50 MCG/ACT NA SUSP: NASAL | 1 refills | Status: DC

## 2023-04-19 MED ORDER — BUSPIRONE HCL 15 MG PO TABS: 15.0000 mg | ORAL_TABLET | Freq: Three times a day (TID) | ORAL | 1 refills | Status: DC

## 2023-04-19 MED ORDER — TRAZODONE HCL 50 MG PO TABS: 50.0000 mg | ORAL_TABLET | Freq: Every evening | ORAL | 1 refills | Status: DC | PRN

## 2023-04-19 MED ORDER — LEVOTHYROXINE SODIUM 100 MCG PO TABS: 100.0000 ug | ORAL_TABLET | Freq: Every day | ORAL | 1 refills | Status: DC

## 2023-04-19 MED ORDER — METOPROLOL SUCCINATE ER 50 MG PO TB24: 50.0000 mg | ORAL_TABLET | Freq: Every day | ORAL | 1 refills | Status: DC

## 2023-04-19 NOTE — Progress Notes (Signed)
Date:  04/19/2023   Name:  Mary Nicholson   DOB:  [birth year]   MRN:  [id number]   Chief Complaint: Cough (Pt ), Medication Refill, Nasal Congestion, Hypertension, and Diabetes (Pt state her sister has an upper respiratory infection and pt started developing a cough and congestion 2 weeks ago. Pt state she has taken otc meds for it and nothing is getting rid of cough and congestion.)  Cough This is a new problem. The current episode started in the past 7 days. The problem has been gradually improving. The problem occurs hourly. The cough is Productive of sputum. Associated symptoms include chills, nasal congestion, postnasal drip and wheezing. Pertinent negatives include no chest pain, ear pain, fever, headaches, heartburn, myalgias, rash, rhinorrhea or shortness of breath. Nothing aggravates the symptoms.  Medication Refill Associated symptoms include chills and coughing. Pertinent negatives include no chest pain, fever, headaches, myalgias or rash.  Hypertension Pertinent negatives include no chest pain, headaches, palpitations or shortness of breath.  Diabetes Pertinent negatives for hypoglycemia include no headaches. Pertinent negatives for diabetes include no chest pain.    Lab Results  Component Value Date   NA 138 04/02/2022   K 4.2 04/02/2022   CO2 21 04/02/2022   GLUCOSE 125 (H) 04/02/2022   BUN 23 04/02/2022   CREATININE 1.00 04/02/2022   CALCIUM 9.8 04/02/2022   EGFR 61 04/02/2022   GFRNONAA >60 10/31/2021   Lab Results  Component Value Date   CHOL 152 11/20/2021   HDL 54 11/20/2021   LDLCALC 68 11/20/2021   TRIG 181 (H) 11/20/2021   Lab Results  Component Value Date   TSH 3.500 04/02/2022   Lab Results  Component Value Date   HGBA1C 6.5 (H) 12/14/2022   Lab Results  Component Value Date   WBC 7.4 10/31/2021   HGB 14.0 10/31/2021   HCT 42.8 10/31/2021   MCV 91.1 10/31/2021   PLT 359 10/31/2021   Lab Results  Component Value Date   ALT 15  08/03/2022   AST 13 08/03/2022   ALKPHOS 94 08/03/2022   BILITOT 0.3 08/03/2022   No results found for: "25OHVITD2", "25OHVITD3", "VD25OH"   Review of Systems  Constitutional:  Positive for chills. Negative for fever.  HENT:  Positive for postnasal drip. Negative for ear pain, hearing loss, rhinorrhea and tinnitus.   Eyes:  Negative for visual disturbance.  Respiratory:  Positive for cough and wheezing. Negative for chest tightness and shortness of breath.   Cardiovascular:  Negative for chest pain, palpitations and leg swelling.  Gastrointestinal:  Negative for heartburn.  Musculoskeletal:  Negative for myalgias.  Skin:  Negative for rash.  Neurological:  Negative for headaches.    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   Chronic radicular lumbar pain 05/04/2021   History of lumbar fusion 05/04/2021   Chronic pain syndrome 05/04/2021   Bipolar affective disorder, mixed (HCC) 11/26/2019   COPD (chronic obstructive pulmonary disease) (HCC) 11/26/2019   PTSD (post-traumatic stress disorder) 11/26/2019   Schizophrenia (HCC) 11/26/2019   Abdominal wall cellulitis 11/12/2019   Abdominal wall abscess    Type 2 diabetes mellitus with diabetic neuropathy, without long-term current use of insulin (HCC)    Hyperlipidemia    Bilateral hand numbness 09/16/2019   S/P hysterectomy 09/09/2019   Stage 3b chronic kidney  disease (HCC) 09/09/2019   Cervical radiculitis 08/13/2019   Chronic bilateral low back pain with right-sided sciatica 08/13/2019   Lumbar radiculopathy 08/13/2019   Neck pain 08/13/2019   Numbness and tingling of both feet 07/30/2019   Dupuytren contracture 05/22/2019   Personal history of transient ischemic attack (TIA), and cerebral infarction without residual deficits 05/22/2019   Resting tremor 05/22/2019    Diverticulosis 05/20/2019   External hemorrhoid 05/20/2019   Abnormal cervical Papanicolaou smear 12/19/2018   Arthralgia of both hands 12/19/2018   Breast pain, right 12/19/2018   Diarrhea 12/19/2018   Dysphagia 12/19/2018   Paresthesias 12/19/2018   Seasonal allergies 12/19/2018   Gastroesophageal reflux disease 09/23/2015   Spinal stenosis of lumbar region 05/04/2015   Allergic rhinitis 11/28/2014   Acquired hypothyroidism 09/13/2014   Essential hypertension 09/13/2014   Type 2 diabetes mellitus with diabetic neuropathy (HCC) 09/13/2014    No Known Allergies  Past Surgical History:  Procedure Laterality Date   ABDOMINAL HYSTERECTOMY     BACK SURGERY     BREAST BIOPSY Right    negative 1988   BREAST EXCISIONAL BIOPSY Right 1988   CHOLECYSTECTOMY     ESOPHAGOGASTRODUODENOSCOPY (EGD) WITH PROPOFOL N/A 08/08/2021   Procedure: ESOPHAGOGASTRODUODENOSCOPY (EGD) WITH PROPOFOL;  Surgeon: Midge Minium, MD;  Location: [facility];  Service: Endoscopy;  Laterality: N/A;   THORACIC LAMINECTOMY FOR SPINAL CORD STIMULATOR N/A 11/08/2021   Procedure: THORACIC LAMINECTOMY FOR SPINAL CORD STIMULATOR IMPLANT (MEDTRONIC);  Surgeon: Venetia Night, MD;  Location: [facility];  Service: Neurosurgery;  Laterality: N/A;   TUBAL LIGATION      Social History   Tobacco Use   Smoking status: Every Day    Current packs/day: 1.00    Average packs/day: 1 pack/day for 57.0 years (57.0 ttl pk-yrs)    Types: Cigarettes   Smokeless tobacco: Never   Tobacco comments:    Has cut back to 1/2 PPD  Vaping Use   Vaping status: Never Used  Substance Use Topics   Alcohol use: Yes    Comment: Occasional   Drug use: Never     Medication list has been reviewed and updated.  Current Meds  Medication Sig   Acetaminophen (TYLENOL ARTHRITIS EXT RELIEF PO) Take 1 capsule by mouth 3 (three) times daily.   albuterol (VENTOLIN HFA) 108 (90 Base) MCG/ACT inhaler INHALE 1-2 PUFFS BY MOUTH EVERY 6 HOURS AS  NEEDED FOR WHEEZE OR SHORTNESS OF BREATH   amoxicillin-clavulanate (AUGMENTIN) 875-125 MG tablet Take 1 tablet by mouth 2 (two) times daily.   aspirin 81 MG chewable tablet Chew by mouth.   atorvastatin (LIPITOR) 80 MG tablet TAKE 1 TABLET BY MOUTH EVERY DAY   b complex vitamins capsule Take 1 capsule by mouth daily.   Biotin 5 MG CAPS Take 1 capsule by mouth daily.   busPIRone (BUSPAR) 15 MG tablet TAKE 1 TABLET BY MOUTH 3 TIMES DAILY.   carboxymethylcellulose (REFRESH PLUS) 0.5 % SOLN Place 1 drop into both eyes daily. 1 drop in left eye BiD and 1 drop to right eye daily   famotidine (PEPCID) 40 MG tablet TAKE 1 TABLET BY MOUTH EVERY DAY   fluticasone (FLONASE) 50 MCG/ACT nasal spray SPRAY 2 SPRAYS INTO EACH NOSTRIL EVERY DAY   Fluticasone-Umeclidin-Vilant (TRELEGY ELLIPTA) 100-62.5-25 MCG/ACT AEPB INHALE 1 PUFF BY MOUTH EVERY DAY   glipiZIDE (GLUCOTROL XL) 10 MG 24 hr tablet Take 1 tablet (10 mg total) by mouth daily.   hydrochlorothiazide (HYDRODIURIL) 25 MG tablet Take 25 mg by mouth  daily.   hydrOXYzine (ATARAX) 25 MG tablet Take 25 mg by mouth 2 (two) times daily.   lamoTRIgine (LAMICTAL) 100 MG tablet Take 1 tablet (100 mg total) by mouth 2 (two) times daily. cbc   levothyroxine (SYNTHROID) 100 MCG tablet TAKE 1 TABLET BY MOUTH EVERY DAY   metFORMIN (GLUCOPHAGE) 1000 MG tablet Take 1 tablet (1,000 mg total) by mouth 2 (two) times daily with a meal.   metoprolol succinate (TOPROL-XL) 50 MG 24 hr tablet Take 50 mg by mouth daily.   Multiple Vitamins-Minerals (CENTRUM SILVER PO) Take 1 each by mouth daily.   mupirocin ointment (BACTROBAN) 2 % Apply 1 Application topically 2 (two) times daily.   neomycin-polymyxin b-dexamethasone (MAXITROL) 3.5-10000-0.1 SUSP    OneTouch Delica Lancets 33G MISC USE 1 EACH ONCE DAILY USE AS INSTRUCTED.   ONETOUCH ULTRA test strip 3 (three) times daily.   oxybutynin (DITROPAN-XL) 5 MG 24 hr tablet TAKE 1 TABLET BY MOUTH EVERYDAY AT BEDTIME   pioglitazone  (ACTOS) 15 MG tablet Take 1 tablet (15 mg total) by mouth daily.   REXULTI 0.5 MG TABS Take 1 tablet by mouth daily.   traZODone (DESYREL) 50 MG tablet Take 50 mg by mouth at bedtime as needed.   venlafaxine XR (EFFEXOR-XR) 75 MG 24 hr capsule TAKE 2 CAPSULES (150 MG TOTAL) BY MOUTH IN THE MORNING AND AT BEDTIME.       04/19/2023    1:42 PM 01/21/2023   11:19 AM 12/14/2022   10:37 AM 08/03/2022   10:32 AM  GAD 7 : Generalized Anxiety Score  Nervous, Anxious, on Edge 2  0 3  Control/stop worrying 3 2 0 3  Worry too much - different things 3 2 0 3  Trouble relaxing 3 0 0 1  Restless 3 0 0 1  Easily annoyed or irritable 2 2 0 2  Afraid - awful might happen 2 0 0 2  Total GAD 7 Score 18  0 15  Anxiety Difficulty Somewhat difficult Not difficult at all Not difficult at all Very difficult       04/19/2023    1:41 PM 01/21/2023   11:18 AM 12/14/2022   10:37 AM  Depression screen PHQ 2/9  Decreased Interest 3 0 0  Down, Depressed, Hopeless 2 0 0  PHQ - 2 Score 5 0 0  Altered sleeping 3 0 0  Tired, decreased energy 3 0 0  Change in appetite 2 0 0  Feeling bad or failure about yourself  3 0 0  Trouble concentrating 3 0 0  Moving slowly or fidgety/restless 2 0 0  Suicidal thoughts 1 0 0  PHQ-9 Score 22 0 0  Difficult doing work/chores Somewhat difficult Not difficult at all Not difficult at all    BP Readings from Last 3 Encounters:  04/19/23 120/68  01/24/23 (!) 204/77  01/21/23 118/64    Physical Exam Vitals and nursing note reviewed. Exam conducted with a chaperone present.  Constitutional:      General: She is not in acute distress.    Appearance: She is not diaphoretic.  HENT:     Head: Normocephalic and atraumatic.     Right Ear: External ear normal.     Left Ear: External ear normal.     Nose: Nose normal.  Eyes:     General:        Right eye: No discharge.        Left eye: No discharge.     Conjunctiva/sclera: Conjunctivae normal.  Pupils: Pupils are equal,  round, and reactive to light.  Neck:     Thyroid: No thyromegaly.     Vascular: No JVD.  Cardiovascular:     Rate and Rhythm: Normal rate and regular rhythm.     Heart sounds: Normal heart sounds. No murmur heard.    No friction rub. No gallop.  Pulmonary:     Effort: Pulmonary effort is normal.     Breath sounds: Normal breath sounds.  Abdominal:     General: Bowel sounds are normal.     Palpations: Abdomen is soft. There is no mass.     Tenderness: There is no abdominal tenderness. There is no guarding.  Musculoskeletal:        General: Normal range of motion.     Cervical back: Normal range of motion and neck supple.  Lymphadenopathy:     Cervical: No cervical adenopathy.  Skin:    General: Skin is warm and dry.  Neurological:     Mental Status: She is alert.     Deep Tendon Reflexes: Reflexes are normal and symmetric.     Wt Readings from Last 3 Encounters:  04/19/23 141 lb 12.8 oz (64.3 kg)  01/24/23 138 lb 3.2 oz (62.7 kg)  01/21/23 139 lb (63 kg)    BP 120/68   Pulse 79   Ht 5\' 1"  (1.549 m)   Wt 141 lb 12.8 oz (64.3 kg)   SpO2 98%   BMI 26.79 kg/m   Assessment and Plan: 1. Chronic obstructive pulmonary disease, unspecified COPD type (HCC) Chronic.  Controlled.  Stable.  Continue albuterol inhaler 1 to 2 puffs every 6 hours as well as Trelegy Ellipta 1 puff daily. - albuterol (VENTOLIN HFA) 108 (90 Base) MCG/ACT inhaler; INHALE 1-2 PUFFS BY MOUTH EVERY 6 HOURS AS NEEDED FOR WHEEZE OR SHORTNESS OF BREATH  Dispense: 3 each; Refill: 2 - Fluticasone-Umeclidin-Vilant (TRELEGY ELLIPTA) 100-62.5-25 MCG/ACT AEPB; INHALE 1 PUFF BY MOUTH EVERY DAY  Dispense: 60 each; Refill: 5  2. Familial hypercholesterolemia Chronic.  Controlled.  Stable.  Continue atorvastatin 80 mg once a day.  Asymptomatic without myalgias or muscle weakness.  Will check CMP for transaminase concerns and lipid panel for current level of LDL control. - atorvastatin (LIPITOR) 80 MG tablet; Take 1  tablet (80 mg total) by mouth daily.  Dispense: 90 tablet; Refill: 1 - Comprehensive metabolic panel - Lipid Panel With LDL/HDL Ratio  3. Bipolar affective disorder, mixed (HCC) Chronic.  Controlled.  Stable.  Patient's been encouraged to reconnect with her therapist/psychiatrist in the meantime I will once more agreed to prescribe her buspirone 15 mg 3 times a day Lamictal 100 mg twice a day and venlafaxine XR 2 capsules 150 mg total morning and bedtime. - busPIRone (BUSPAR) 15 MG tablet; Take 1 tablet (15 mg total) by mouth 3 (three) times daily.  Dispense: 45 tablet; Refill: 1 - lamoTRIgine (LAMICTAL) 100 MG tablet; Take 1 tablet (100 mg total) by mouth 2 (two) times daily. cbc  Dispense: 60 tablet; Refill: 5 - venlafaxine XR (EFFEXOR-XR) 75 MG 24 hr capsule; Take 2 capsules (150 mg total) by mouth in the morning and at bedtime.  Dispense: 60 capsule; Refill: 5  4. Esophageal dysphagia Chronic.  Controlled.  Stable.  Continue famotidine 40 mg once a day. - famotidine (PEPCID) 40 MG tablet; Take 1 tablet (40 mg total) by mouth daily.  Dispense: 90 tablet; Refill: 1  5. Gastroesophageal reflux disease without esophagitis Chronic.  As noted above. -  famotidine (PEPCID) 40 MG tablet; Take 1 tablet (40 mg total) by mouth daily.  Dispense: 90 tablet; Refill: 1  6. Seasonal allergic rhinitis due to pollen Chronic.  Controlled.  Stable.  Continue Flonase 2 sprays each nostril daily. - fluticasone (FLONASE) 50 MCG/ACT nasal spray; SPRAY 2 SPRAYS INTO EACH NOSTRIL EVERY DAY  Dispense: 48 mL; Refill: 1  7. Diabetes mellitus treated with oral medication (HCC) .  Controlled.  Stable.  Asymptomatic.  Continue glipizide XL 10 mg once a day, metformin 1 g twice a day and pioglitazone 15 mg once a day.  Will check CMP for electrolytes and GFR.  Will check A1c for current level of diabetic control. - glipiZIDE (GLUCOTROL XL) 10 MG 24 hr tablet; Take 1 tablet (10 mg total) by mouth daily.  Dispense: 90  tablet; Refill: 1 - metFORMIN (GLUCOPHAGE) 1000 MG tablet; Take 1 tablet (1,000 mg total) by mouth 2 (two) times daily with a meal.  Dispense: 180 tablet; Refill: 1 - pioglitazone (ACTOS) 15 MG tablet; Take 1 tablet (15 mg total) by mouth daily.  Dispense: 90 tablet; Refill: 1 - Comprehensive metabolic panel - Hemoglobin A1c  8. Acquired hypothyroidism .  Controlled.  Stable.  Pending TSH we will adjust levothyroxine either at current dosing of 100 mcg daily or adjust accordingly. - levothyroxine (SYNTHROID) 100 MCG tablet; Take 1 tablet (100 mcg total) by mouth daily.  Dispense: 90 tablet; Refill: 1 - TSH  9. Overactive bladder Chronic.  Controlled.  Stable.  Asymptomatic as long as taking medication continue oxybutynin XR 5 mg once a day at bedtime preferably. - oxybutynin (DITROPAN-XL) 5 MG 24 hr tablet; Take 1 tablet (5 mg total) by mouth at bedtime.  Dispense: 90 tablet; Refill: 3  10. Anxiety and depression Chronic.  Controlled.  Stable.  PHQ is 22 GAD score is 18 this is followed by therapist and psychiatrist and patient has been encouraged to return back to the care of her psychiatrist for medication continuance and CBT. - venlafaxine XR (EFFEXOR-XR) 75 MG 24 hr capsule; Take 2 capsules (150 mg total) by mouth in the morning and at bedtime.  Dispense: 60 capsule; Refill: 5     Elizabeth Sauer, MD

## 2023-04-20 ENCOUNTER — Encounter: Payer: Self-pay | Admitting: Family Medicine

## 2023-04-20 LAB — LIPID PANEL WITH LDL/HDL RATIO
Cholesterol, Total: 219 mg/dL — ABNORMAL HIGH (ref 100–199)
HDL: 48 mg/dL (ref >39)
LDL Chol Calc (NIH): 111 mg/dL — ABNORMAL HIGH (ref 0–99)
LDL/HDL Ratio: 2.3 {ratio} (ref 0.0–3.2)
Triglycerides: 347 mg/dL — ABNORMAL HIGH (ref 0–149)
VLDL Cholesterol Cal: 60 mg/dL — ABNORMAL HIGH (ref 5–40)

## 2023-04-20 LAB — COMPREHENSIVE METABOLIC PANEL
ALT: 18 [IU]/L (ref 0–32)
AST: 14 [IU]/L (ref 0–40)
Albumin: 4.6 g/dL (ref 3.9–4.9)
Alkaline Phosphatase: 101 [IU]/L (ref 44–121)
BUN/Creatinine Ratio: 14 (ref 12–28)
BUN: 13 mg/dL (ref 8–27)
Bilirubin Total: 0.3 mg/dL (ref 0.0–1.2)
CO2: 25 mmol/L (ref 20–29)
Calcium: 10.2 mg/dL (ref 8.7–10.3)
Chloride: 97 mmol/L (ref 96–106)
Creatinine, Ser: 0.94 mg/dL (ref 0.57–1.00)
Globulin, Total: 2.3 g/dL (ref 1.5–4.5)
Glucose: 76 mg/dL (ref 70–99)
Potassium: 4.6 mmol/L (ref 3.5–5.2)
Sodium: 138 mmol/L (ref 134–144)
Total Protein: 6.9 g/dL (ref 6.0–8.5)
eGFR: 66 mL/min/{1.73_m2} (ref >59)

## 2023-04-20 LAB — TSH: TSH: 1.38 u[IU]/mL (ref 0.450–4.500)

## 2023-04-20 LAB — HEMOGLOBIN A1C
Est. average glucose Bld gHb Est-mCnc: 151 mg/dL
Hgb A1c MFr Bld: 6.9 % — ABNORMAL HIGH (ref 4.8–5.6)

## 2023-05-29 ENCOUNTER — Encounter: Payer: 59 | Admitting: Family Medicine

## 2023-06-10 ENCOUNTER — Encounter: Payer: Self-pay | Admitting: Family Medicine

## 2023-06-10 ENCOUNTER — Ambulatory Visit (INDEPENDENT_AMBULATORY_CARE_PROVIDER_SITE_OTHER): Payer: 59 | Admitting: Family Medicine

## 2023-06-10 VITALS — BP 158/81 | HR 88 | Temp 97.9°F | Resp 14 | Ht 61.0 in | Wt 146.2 lb

## 2023-06-10 DIAGNOSIS — E118 Type 2 diabetes mellitus with unspecified complications: Secondary | ICD-10-CM | POA: Diagnosis not present

## 2023-06-10 DIAGNOSIS — F17210 Nicotine dependence, cigarettes, uncomplicated: Secondary | ICD-10-CM

## 2023-06-10 DIAGNOSIS — G8929 Other chronic pain: Secondary | ICD-10-CM

## 2023-06-10 DIAGNOSIS — Z13820 Encounter for screening for osteoporosis: Secondary | ICD-10-CM | POA: Diagnosis not present

## 2023-06-10 DIAGNOSIS — I10 Essential (primary) hypertension: Secondary | ICD-10-CM | POA: Diagnosis not present

## 2023-06-10 DIAGNOSIS — M48062 Spinal stenosis, lumbar region with neurogenic claudication: Secondary | ICD-10-CM | POA: Diagnosis not present

## 2023-06-10 DIAGNOSIS — E1140 Type 2 diabetes mellitus with diabetic neuropathy, unspecified: Secondary | ICD-10-CM | POA: Diagnosis not present

## 2023-06-10 DIAGNOSIS — Z7984 Long term (current) use of oral hypoglycemic drugs: Secondary | ICD-10-CM

## 2023-06-10 DIAGNOSIS — E7801 Familial hypercholesterolemia: Secondary | ICD-10-CM

## 2023-06-10 DIAGNOSIS — F209 Schizophrenia, unspecified: Secondary | ICD-10-CM

## 2023-06-10 DIAGNOSIS — M5416 Radiculopathy, lumbar region: Secondary | ICD-10-CM

## 2023-06-10 LAB — POCT UA - MICROALBUMIN
Albumin/Creatinine Ratio, Urine, POC: <30
Creatinine, POC: 50 mg/dL
Microalbumin Ur, POC: 10 mg/L

## 2023-06-10 MED ORDER — GABAPENTIN 300 MG PO CAPS: 300.0000 mg | ORAL_CAPSULE | Freq: Three times a day (TID) | ORAL | 1 refills | Status: DC

## 2023-06-10 NOTE — Progress Notes (Unsigned)
 New Patient Office Visit  Subjective    Patient ID: Mary Nicholson, female    DOB: Mar 18, 1956  Age: 68 y.o. MRN: [id number]  CC:  Chief Complaint  Patient presents with   Establish Care    HPI Mary Nicholson presents to establish care 68 year old woman with type 2 diabetes on disability since 2005 for significant degenerative disc disease.,  Hypothyroidism, GERD, cervical radiculitis, COPD, diverticulosis, Dupuytren's contracture, hypertension, mixed hyperlipidemia peripheral neuropathy. She has a lumbar nerve stimulator in the left side which helps some with her lumbar pain she takes ibuprofen and Tylenol alternating every 2 hours.  She asked for gabapentin 600 mg 3 times a day because it helps better with the pain then Tylenol and ibuprofen.  She does not go to pain management.  Occasionally she goes to neurology where she gets shots in the back of her head and shoulders for headaches [facility] clinic) She has had TIAs and her left side was weak but she has no sequela of that now. She has a bipolar schizophrenia, PTSD and depression.  She is followed by Dr. Zettie Cooley at [facility] day psychiatry in [place].   Her last A1c was 2 months ago she states it was good at 6 to 7%.  She moves around a lot and eats a high-protein diet.  She lives with her daughter.  She does feet checks every day and wear shoes in the house  PSHx: She had an abnormal Pap and a abdominal hysterectomy.  She had an EGD 423, she has had spinal cord stimulator placed and a BTL.  Lap chole, tubal pregnancy.  She had a colonoscopy 03/13/2019 She has not had a DEXA scan in greater than 10 years   Outpatient Encounter Medications as of 06/10/2023  Medication Sig   Acetaminophen (TYLENOL ARTHRITIS EXT RELIEF PO) Take 1 capsule by mouth 3 (three) times daily.   albuterol (VENTOLIN HFA) 108 (90 Base) MCG/ACT inhaler INHALE 1-2 PUFFS BY MOUTH EVERY 6 HOURS AS NEEDED FOR WHEEZE OR SHORTNESS OF BREATH   aspirin 81  MG chewable tablet Chew by mouth.   atorvastatin (LIPITOR) 80 MG tablet Take 1 tablet (80 mg total) by mouth daily.   b complex vitamins capsule Take 1 capsule by mouth daily.   busPIRone (BUSPAR) 15 MG tablet Take 1 tablet (15 mg total) by mouth 3 (three) times daily.   famotidine (PEPCID) 40 MG tablet Take 1 tablet (40 mg total) by mouth daily.   fluticasone (FLONASE) 50 MCG/ACT nasal spray SPRAY 2 SPRAYS INTO EACH NOSTRIL EVERY DAY   Fluticasone-Umeclidin-Vilant (TRELEGY ELLIPTA) 100-62.5-25 MCG/ACT AEPB INHALE 1 PUFF BY MOUTH EVERY DAY   gabapentin (NEURONTIN) 300 MG capsule Take 1 capsule (300 mg total) by mouth 3 (three) times daily.   glipiZIDE (GLUCOTROL XL) 10 MG 24 hr tablet Take 1 tablet (10 mg total) by mouth daily.   hydrochlorothiazide (HYDRODIURIL) 25 MG tablet Take 1 tablet (25 mg total) by mouth daily.   hydrOXYzine (ATARAX) 25 MG tablet Take 25 mg by mouth 2 (two) times daily.   lamoTRIgine (LAMICTAL) 100 MG tablet Take 1 tablet (100 mg total) by mouth 2 (two) times daily. cbc   levothyroxine (SYNTHROID) 100 MCG tablet Take 1 tablet (100 mcg total) by mouth daily.   metFORMIN (GLUCOPHAGE) 1000 MG tablet Take 1 tablet (1,000 mg total) by mouth 2 (two) times daily with a meal.   metoprolol succinate (TOPROL-XL) 50 MG 24 hr tablet Take 1 tablet (50 mg total) by  mouth daily.   Multiple Vitamins-Minerals (CENTRUM SILVER PO) Take 1 each by mouth daily.   OneTouch Delica Lancets 33G MISC USE 1 EACH ONCE DAILY USE AS INSTRUCTED.   ONETOUCH ULTRA test strip 3 (three) times daily.   oxybutynin (DITROPAN-XL) 5 MG 24 hr tablet Take 1 tablet (5 mg total) by mouth at bedtime.   pioglitazone (ACTOS) 15 MG tablet Take 1 tablet (15 mg total) by mouth daily.   REXULTI 0.5 MG TABS Take 1 tablet by mouth daily.   traZODone (DESYREL) 50 MG tablet Take 1 tablet (50 mg total) by mouth at bedtime as needed.   venlafaxine XR (EFFEXOR-XR) 75 MG 24 hr capsule Take 2 capsules (150 mg total) by mouth in  the morning and at bedtime.   amLODipine (NORVASC) 10 MG tablet Take 10 mg by mouth daily. (Patient not taking: Reported on 06/10/2023)   amoxicillin-clavulanate (AUGMENTIN) 875-125 MG tablet Take 1 tablet by mouth 2 (two) times daily. (Patient not taking: Reported on 06/10/2023)   Biotin 5 MG CAPS Take 1 capsule by mouth daily. (Patient not taking: Reported on 06/10/2023)   carboxymethylcellulose (REFRESH PLUS) 0.5 % SOLN Place 1 drop into both eyes daily. 1 drop in left eye BiD and 1 drop to right eye daily (Patient not taking: Reported on 06/10/2023)   lisinopril (ZESTRIL) 10 MG tablet Take 10 mg by mouth daily. (Patient not taking: Reported on 06/10/2023)   mupirocin ointment (BACTROBAN) 2 % Apply 1 Application topically 2 (two) times daily. (Patient not taking: Reported on 06/10/2023)   neomycin-polymyxin b-dexamethasone (MAXITROL) 3.5-10000-0.1 SUSP  (Patient not taking: Reported on 06/10/2023)   SODIUM FLUORIDE 5000 PPM 1.1 % PSTE See admin instructions. (Patient not taking: Reported on 06/10/2023)   No facility-administered encounter medications on file as of 06/10/2023.    Past Medical History:  Diagnosis Date   Anemia    Arthritis    Bipolar 1 disorder (HCC)    Cancer (HCC)    cervical   Chronic kidney disease    COPD (chronic obstructive pulmonary disease) (HCC)    Diabetes mellitus without complication (HCC)    Dyspnea    GERD (gastroesophageal reflux disease)    History of kidney stones    Hx of right breast biopsy 1988   RIGHT excisional bx 1988   Hyperlipidemia    Hypertension    Hypothyroidism    Pneumonia    PTSD (post-traumatic stress disorder)    Schizophrenia (HCC)    Spinal stenosis    Stroke (HCC)    mild 2013   Thyroid disease     Past Surgical History:  Procedure Laterality Date   ABDOMINAL HYSTERECTOMY     BACK SURGERY     BREAST BIOPSY Right    negative 1988   BREAST EXCISIONAL BIOPSY Right 1988   CHOLECYSTECTOMY     ESOPHAGOGASTRODUODENOSCOPY (EGD)  WITH PROPOFOL N/A 08/08/2021   Procedure: ESOPHAGOGASTRODUODENOSCOPY (EGD) WITH PROPOFOL;  Surgeon: Midge Minium, MD;  Location: [facility] ENDOSCOPY;  Service: Endoscopy;  Laterality: N/A;   THORACIC LAMINECTOMY FOR SPINAL CORD STIMULATOR N/A 11/08/2021   Procedure: THORACIC LAMINECTOMY FOR SPINAL CORD STIMULATOR IMPLANT (MEDTRONIC);  Surgeon: Venetia Night, MD;  Location: [facility] ORS;  Service: Neurosurgery;  Laterality: N/A;   TUBAL LIGATION      Family History  Problem Relation Age of Onset   Diabetes Mother    Heart failure Mother    Ovarian cancer Mother 97       question dx?   Alzheimer's disease Father  COPD Father    Breast cancer Neg Hx     Social History   Socioeconomic History   Marital status: Single    Spouse name: Not on file   Number of children: Not on file   Years of education: Not on file   Highest education level: Not on file  Occupational History   Not on file  Tobacco Use   Smoking status: Every Day    Current packs/day: 1.00    Average packs/day: 1 pack/day for 57.0 years (57.0 ttl pk-yrs)    Types: Cigarettes   Smokeless tobacco: Never   Tobacco comments:    Has cut back to 1/2 PPD  Vaping Use   Vaping status: Never Used  Substance and Sexual Activity   Alcohol use: Yes    Comment: Occasional   Drug use: Never   Sexual activity: Not on file  Other Topics Concern   Not on file  Social History Narrative   Pt lives with her daughter   Social Drivers of Health   Financial Resource Strain: Low Risk  (09/26/2022)   Overall Financial Resource Strain (CARDIA)    Difficulty of Paying Living Expenses: Not hard at all  Food Insecurity: No Food Insecurity (09/26/2022)   Hunger Vital Sign    Worried About Running Out of Food in the Last Year: Never true    Ran Out of Food in the Last Year: Never true  Transportation Needs: No Transportation Needs (09/26/2022)   PRAPARE - Administrator, Civil Service (Medical): No    Lack of Transportation  (Non-Medical): No  Physical Activity: Sufficiently Active (09/26/2022)   Exercise Vital Sign    Days of Exercise per Week: 3 days    Minutes of Exercise per Session: 60 min  Stress: No Stress Concern Present (09/26/2022)   [employer] of Occupational Health - Occupational Stress Questionnaire    Feeling of Stress : Not at all  Social Connections: Socially Isolated (09/26/2022)   Social Connection and Isolation Panel [NHANES]    Frequency of Communication with Friends and Family: More than three times a week    Frequency of Social Gatherings with Friends and Family: Twice a week    Attends Religious Services: Never    Database administrator or Organizations: No    Attends Banker Meetings: Never    Marital Status: Divorced  Catering manager Violence: Not At Risk (09/26/2022)   Humiliation, Afraid, Rape, and Kick questionnaire    Fear of Current or Ex-Partner: No    Emotionally Abused: No    Physically Abused: No    Sexually Abused: No    ROS      Objective    BP (!) 158/81 (BP Location: Right Arm, Patient Position: Sitting, Cuff Size: Normal)   Pulse 88   Temp 97.9 F (36.6 C) (Oral)   Resp 14   Ht 5\' 1"  (1.549 m)   Wt 146 lb 3.2 oz (66.3 kg)   SpO2 95%   BMI 27.62 kg/m   Physical Exam Vitals and nursing note reviewed.  Constitutional:      Appearance: Normal appearance.  HENT:     Head: Normocephalic and atraumatic.  Eyes:     Conjunctiva/sclera: Conjunctivae normal.  Cardiovascular:     Rate and Rhythm: Normal rate and regular rhythm.  Pulmonary:     Effort: Pulmonary effort is normal.     Breath sounds: Normal breath sounds.  Musculoskeletal:     Right lower leg:  No edema.     Left lower leg: No edema.  Skin:    General: Skin is warm and dry.  Neurological:     Mental Status: She is alert and oriented to person, place, and time.  Psychiatric:        Mood and Affect: Mood normal.        Behavior: Behavior normal.        Thought Content:  Thought content normal.        Judgment: Judgment normal.         Assessment & Plan:   Problem List Items Addressed This Visit       Cardiovascular and Mediastinum   Essential hypertension   Her blood pressure was uncontrolled at 161/97.  At recheck her blood pressure was 158/81.  She has taken amlodipine 10 mg in the past but is not currently on this she is on HCTZ 25 mg daily she was taking lisinopril 10 mg but is no longer taking this she is on metoprolol succinate 50 mg daily.  Will bring her back in a week to recheck her blood pressure.        Endocrine   Type 2 diabetes mellitus with diabetic neuropathy (HCC)   Relevant Orders   POCT UA - Microalbumin (Completed)   Controlled diabetes mellitus type 2 with complications (HCC)   She takes glipizide 10 mg daily pioglitazone 15 mg daily she does not report swelling in her feet        Other   Hyperlipidemia   Her chart shows she was taking atorvastatin 80 mg but she reports she is no longer on this.      Schizophrenia [facility])   Psychiatric medicines include BuSpar 50 mg 3 times daily hydroxyzine 25 mg twice daily as needed Lamictal 100 mg 2 times daily trazodone 50 mg nightly venlafaxine 75 mg 2 p.o. daily she is followed by Dr. Christella Hartigan in [facility].      Spinal stenosis of lumbar region   She request gabapentin 600 mg TID for her back pain.  She has not had this since December.  Advised to start with a lower dosage and work up to 600 MG TID if needed.  Reviewed her chart and she has taken gabapentin 300mg  TID for last several years.        Chronic radicular lumbar pain   Relevant Medications   gabapentin (NEURONTIN) 300 MG capsule   Other Visit Diagnoses       Screening for osteoporosis    -  Primary   Relevant Orders   DG Bone Density     Cigarette nicotine dependence without complication       Relevant Orders   CT CHEST LUNG CANCER SCREENING LOW DOSE WO CONTRAST       Return in about 2 months (around 08/09/2023).    Alease Medina, MD

## 2023-06-10 NOTE — Assessment & Plan Note (Addendum)
 She request gabapentin 600 mg TID for her back pain.  She has not had this since December.  Advised to start with a lower dosage and work up to 600 MG TID if needed.  Reviewed her chart and she has taken gabapentin 300mg  TID for last several years.

## 2023-06-11 DIAGNOSIS — E118 Type 2 diabetes mellitus with unspecified complications: Secondary | ICD-10-CM | POA: Insufficient documentation

## 2023-06-11 NOTE — Assessment & Plan Note (Signed)
 She takes glipizide 10 mg daily pioglitazone 15 mg daily she does not report swelling in her feet

## 2023-06-11 NOTE — Assessment & Plan Note (Signed)
 Psychiatric medicines include BuSpar 50 mg 3 times daily hydroxyzine 25 mg twice daily as needed Lamictal 100 mg 2 times daily trazodone 50 mg nightly venlafaxine 75 mg 2 p.o. daily she is followed by Dr. Christella Hartigan in [place].

## 2023-06-11 NOTE — Assessment & Plan Note (Signed)
 Her blood pressure was uncontrolled at 161/97.  At recheck her blood pressure was 158/81.  She has taken amlodipine 10 mg in the past but is not currently on this she is on HCTZ 25 mg daily she was taking lisinopril 10 mg but is no longer taking this she is on metoprolol succinate 50 mg daily.  Will bring her back in a week to recheck her blood pressure.

## 2023-06-11 NOTE — Assessment & Plan Note (Signed)
 Her chart shows she was taking atorvastatin 80 mg but she reports she is no longer on this.

## 2023-07-03 ENCOUNTER — Ambulatory Visit: Payer: Self-pay | Admitting: Family Medicine

## 2023-07-03 NOTE — Telephone Encounter (Signed)
 Copied from CRM [id number]. Topic: Clinical - Red Word Triage >> Jul 03, 2023  3:32 PM Martha Clan wrote: Red Word that prompted transfer to Nurse Triage: Patient was around younger sister with Covid, patient has been getting increasingly sick, with trouble breathing, loss of bowel control    Chief Complaint: Covid-19 exposure, difficulty breathing, chest pressure Symptoms: dizziness, runny nose, losing balance, sweats and cold chills, dry cough, nausea, diarrhea, inability to control her bowels or urine, generalized malaise Frequency: 3-4 days but shortness of breath/chest tightness started 2 days ago Pertinent Negatives: Patient denies vomiting,  Disposition: [x] ED /[] Urgent Care (no appt availability in office) / [] Appointment(In office/virtual)/ []  [place] [facility]/ [] Home Care/ [] Refused Recommended Disposition /[] [place] [facility]/ []  Follow-up with PCP Additional Notes: Patient states that she has been sick for the past 3-4 days. She states hard time breathing and high blood pressure.  Her sister tested positive for Covid-19. Patient states her oxygen has gone down as low as 91%. At the time of triage, patient's oxygen is 93% and pulse is 87 Patient states she has bad diarrhea, no control  Patient states that her blood pressure has been up and down lately and it got as high as in the 200s systolic recently. Patient's blood pressure at the time of triage was 142/67. Patient states she is short of breath on exertion or when laying down & she has chest pain/pressure that is present at times when she is not coughing.  She also states that she cannot control her bowels due to diarrhea and cannot control her urine either.  Patient's daughter is with her and is a Radiation protection practitioner.  Patient is advised that at this time with difficulty breathing, chest pain at rest, and concerns about her Oxygen Saturation dropping, the recommendation is for the patient to go to the emergency room for further evaluation.   Patient is advised for someone else to drive and at any point on the way to the hospital if something worsens, they are advised that they can pull over and call 911 for an ambulance if needed.  Patient verbalized understanding.   Reason for Disposition . Chest pain or pressure  (Exception: MILD central chest pain, present only when coughing.)  Answer Assessment - Initial Assessment Questions 1. RESPIRATORY STATUS: "Describe your breathing?" (e.g., wheezing, shortness of breath, unable to speak, severe coughing)      Shortness of breath chest tightness 2. ONSET: "When did this breathing problem begin?"      2 days ago 3. PATTERN "Does the difficult breathing come and go, or has it been constant since it started?"      Comes and goes  worse when laying down 4. SEVERITY: "How bad is your breathing?" (e.g., mild, moderate, severe)    - MILD: No SOB at rest, mild SOB with walking, speaks normally in sentences, can lie down, no retractions, pulse < 100.    - MODERATE: SOB at rest, SOB with minimal exertion and prefers to sit, cannot lie down flat, speaks in phrases, mild retractions, audible wheezing, pulse 100-120.    - SEVERE: Very SOB at rest, speaks in single words, struggling to breathe, sitting hunched forward, retractions, pulse > 120      Moderate 5. RECURRENT SYMPTOM: "Have you had difficulty breathing before?" If Yes, ask: "When was the last time?" and "What happened that time?"      No 6. CARDIAC HISTORY: "Do you have any history of heart disease?" (e.g., heart attack, angina, bypass  surgery, angioplasty)      Hypertension & two strokes in 2013 7. LUNG HISTORY: "Do you have any history of lung disease?"  (e.g., pulmonary embolus, asthma, emphysema)     COPD 8. CAUSE: "What do you think is causing the breathing problem?"      Possibly covid exposure 9. OTHER SYMPTOMS: "Do you have any other symptoms? (e.g., dizziness, runny nose, cough, chest pain, fever)     Runny nose, dizziness,   10. O2 SATURATION MONITOR:  "Do you use an oxygen saturation monitor (pulse oximeter) at home?" If Yes, ask: "What is your reading (oxygen level) today?" "What is your usual oxygen saturation reading?" (e.g., 95%)       93% and 87 pulse 12. TRAVEL: "Have you traveled out of the country in the last month?" (e.g., travel history, exposures)       No  Answer Assessment - Initial Assessment Questions 1. COVID-19 DIAGNOSIS: "How do you know that you have COVID?" (e.g., positive lab test or self-test, diagnosed by doctor or NP/PA, symptoms after exposure).     Patient's daughter is positive for covid and she has been around her 2. COVID-19 EXPOSURE: "Was there any known exposure to COVID before the symptoms began?" CDC Definition of close contact: within 6 feet (2 meters) for a total of 15 minutes or more over a 24-hour period.      daughter 3. ONSET: "When did the COVID-19 symptoms start?"      4 days ago 4. WORST SYMPTOM: "What is your worst symptom?" (e.g., cough, fever, shortness of breath, muscle aches)     Shortness of breath/chest pressure 5. COUGH: "Do you have a cough?" If Yes, ask: "How bad is the cough?"       Yes dry hacking cough 6. FEVER: "Do you have a fever?" If Yes, ask: "What is your temperature, how was it measured, and when did it start?"     Unknown but patient has sweats and cold chills 7. RESPIRATORY STATUS: "Describe your breathing?" (e.g., normal; shortness of breath, wheezing, unable to speak)      Short of breath especially when laying flat or exerting herself, history of COPD 8. BETTER-SAME-WORSE: "Are you getting better, staying the same or getting worse compared to yesterday?"  If getting worse, ask, "In what way?"     Worse 9. OTHER SYMPTOMS: "Do you have any other symptoms?"  (e.g., chills, fatigue, headache, loss of smell or taste, muscle pain, sore throat)     Chills, fatigue,  10. HIGH RISK DISEASE: "Do you have any chronic medical problems?" (e.g., asthma,  heart or lung disease, weak immune system, obesity, etc.)       COPD, High blood pressure 11. VACCINE: "Have you had the COVID-19 vaccine?" If Yes, ask: "Which one, how many shots, when did you get it?"       Didn't ask 13. O2 SATURATION MONITOR:  "Do you use an oxygen saturation monitor (pulse oximeter) at home?" If Yes, ask "What is your reading (oxygen level) today?" "What is your usual oxygen saturation reading?" (e.g., 95%)       As low as 91% but 93% while on phone with this RN during triage  Protocols used: Breathing Difficulty-A-AH, Coronavirus (COVID-19) Diagnosed or Suspected-A-AH

## 2023-07-06 ENCOUNTER — Other Ambulatory Visit: Payer: Self-pay | Admitting: Family Medicine

## 2023-07-06 DIAGNOSIS — E119 Type 2 diabetes mellitus without complications: Secondary | ICD-10-CM

## 2023-07-19 ENCOUNTER — Other Ambulatory Visit: Payer: Self-pay | Admitting: Family Medicine

## 2023-07-19 DIAGNOSIS — L0201 Cutaneous abscess of face: Secondary | ICD-10-CM

## 2023-07-19 DIAGNOSIS — E1140 Type 2 diabetes mellitus with diabetic neuropathy, unspecified: Secondary | ICD-10-CM

## 2023-07-19 DIAGNOSIS — Z22322 Carrier or suspected carrier of Methicillin resistant Staphylococcus aureus: Secondary | ICD-10-CM

## 2023-07-19 DIAGNOSIS — J301 Allergic rhinitis due to pollen: Secondary | ICD-10-CM

## 2023-07-31 ENCOUNTER — Encounter: Payer: Self-pay | Admitting: Family Medicine

## 2023-07-31 ENCOUNTER — Ambulatory Visit: Payer: Self-pay

## 2023-07-31 ENCOUNTER — Ambulatory Visit (INDEPENDENT_AMBULATORY_CARE_PROVIDER_SITE_OTHER): Admitting: Family Medicine

## 2023-07-31 VITALS — BP 126/72 | HR 69 | Temp 98.1°F | Resp 18 | Ht 61.0 in | Wt 141.0 lb

## 2023-07-31 DIAGNOSIS — G6289 Other specified polyneuropathies: Secondary | ICD-10-CM

## 2023-07-31 DIAGNOSIS — E1140 Type 2 diabetes mellitus with diabetic neuropathy, unspecified: Secondary | ICD-10-CM | POA: Diagnosis not present

## 2023-07-31 DIAGNOSIS — I10 Essential (primary) hypertension: Secondary | ICD-10-CM | POA: Diagnosis not present

## 2023-07-31 DIAGNOSIS — Z7984 Long term (current) use of oral hypoglycemic drugs: Secondary | ICD-10-CM

## 2023-07-31 DIAGNOSIS — G629 Polyneuropathy, unspecified: Secondary | ICD-10-CM | POA: Insufficient documentation

## 2023-07-31 DIAGNOSIS — G8929 Other chronic pain: Secondary | ICD-10-CM | POA: Diagnosis not present

## 2023-07-31 DIAGNOSIS — M5416 Radiculopathy, lumbar region: Secondary | ICD-10-CM | POA: Diagnosis not present

## 2023-07-31 LAB — POCT GLYCOSYLATED HEMOGLOBIN (HGB A1C): Hemoglobin A1C: 7.1 % — AB (ref 4.0–5.6)

## 2023-07-31 MED ORDER — GABAPENTIN 300 MG PO CAPS: 300.0000 mg | ORAL_CAPSULE | Freq: Three times a day (TID) | ORAL | 1 refills | Status: DC

## 2023-07-31 NOTE — Assessment & Plan Note (Addendum)
 Likely multifactorial with lumbar radiculopathy and DMT2 causing neuropathy.  Not certain what could make the peripheral neuropathy suddenly worse.  Will check vitamin B12.  She smokes and takes metformin both of which can lower her B12 level.   Her neuropathy increases her falls risk.  She is walking with a cane but would be safer if she used a rollator

## 2023-07-31 NOTE — Telephone Encounter (Signed)
 Copied from CRM [id number]. Topic: Clinical - Red Word Chief Complaint: Weakness Symptoms: See notes Frequency: since Sunday  Pertinent Negatives: Patient denies numbness/weakness of arms, legs, face Disposition: [] ED /[] Urgent Care (no appt availability in office) / [x] Appointment(In office/virtual)/ []  [place] [facility]/ [] Home Care/ [] Refused Recommended Disposition /[] [place] [facility]/ []  Follow-up with PCP Additional Notes: Patient called stating she would like to be seen by PCP today, as suggested by daughter who is a paramedic, due to symptoms she's been having since Sunday. Patient was having vomiting and diarrhea on Sunday (which has resolved) but also is having mild numbness in both feet. Patient states the numbness was worsened yesterday with discoloration but has improved some today. Patient is able to walk around with cane. Patient states she is feeling very weak and tired. Reports a BP of 133/75 and glucose of 160. Patient also states her oxygen has been fluctuating the last few weeks while she's recovering from Covid. Patient appt made for today with PCP.    >> Jul 31, 2023  8:51 AM Gaetano Hawthorne wrote: Red Word that prompted transfer to Nurse Triage: Sick on Sunday night - vomiting and issues with her bowels until Monday morning, Monday slept all day, sweaty and clammy - yesterday, feet and calves felt numb, feet were blue and dark, patient couldn't feel her feet and feet felt very cold - patient felt sleepy and weak. no fever. Patient is having difficulty walking and is using a caine. This morning blood pressure 133/75. Sugar 160. Patient ate yesterday but not a whole lot Reason for Disposition  [1] MODERATE weakness (i.e., interferes with work, school, normal activities) AND [2] cause unknown  (Exceptions: Weakness from acute minor illness or poor fluid intake; weakness is chronic and not worse.)  Answer Assessment - Initial Assessment Questions 1. DESCRIPTION: "Describe how you are  feeling."     Weakness, mild numbness of the feet, recovering from covid  2. SEVERITY: "How bad is it?"  "Can you stand and walk?"   - MILD (0-3): Feels weak or tired, but does not interfere with work, school or normal activities.   - MODERATE (4-7): Able to stand and walk; weakness interferes with work, school, or normal activities.   - SEVERE (8-10): Unable to stand or walk; unable to do usual activities.     Able to walk with cain but weak and wobbly 3. ONSET: "When did these symptoms begin?" (e.g., hours, days, weeks, months)     Sunday night 4. CAUSE: "What do you think is causing the weakness or fatigue?" (e.g., not drinking enough fluids, medical problem, trouble sleeping)     Vomiting,  5. NEW MEDICINES:  "Have you started on any new medicines recently?" (e.g., opioid pain medicines, benzodiazepines, muscle relaxants, antidepressants, antihistamines, neuroleptics, beta blockers)     No 6. OTHER SYMPTOMS: "Do you have any other symptoms?" (e.g., chest pain, fever, cough, SOB, vomiting, diarrhea, bleeding, other areas of pain)     No  Protocols used: Weakness (Generalized) and Fatigue-A-AH

## 2023-07-31 NOTE — Assessment & Plan Note (Addendum)
 She is taking hydrochlorothiazide 25mg  daily and metoprolol succinate 50mg  daily.  She states if she adds  lisinopril 10mg  then her BP gets down like 99/30.  She is not taking amlodipine 10mg . Recheck of her BP was 126/72.  She does not need the lisinopril or amlodipine.  May consider stopping hydrochlorothiazide and starting lisinopril 10mg  daily to give her renal protection.

## 2023-07-31 NOTE — Assessment & Plan Note (Signed)
 She has a nerve stimulator in situ.

## 2023-07-31 NOTE — Assessment & Plan Note (Signed)
 A1c is 7.1% today.  She is doing a good job with her DMT2.

## 2023-07-31 NOTE — Progress Notes (Signed)
 Established Patient Office Visit  Subjective   Patient ID: Mary Nicholson, female    DOB: [birth year]  Age: 68 y.o. MRN: [id number]  Chief Complaint  Patient presents with   Fatigue   Numbness    Started Tuesday. Bilateral legs to feet   Emesis    Started Sunday night   Diarrhea    Started Sunday night   Excessive Sweating    Started Monday   Tingling    Bilateral feet. Cold to touch. Looks dark    Emesis  Associated symptoms include diarrhea.  Diarrhea  Associated symptoms include vomiting.   68 yo woman with DMT2, hypothyroidism, GERD, HTN, cervical radiculopathy, COPD, diverticulitis, peripheral neuropathy. On Sunday she had nausea, vomiting and diarrhea.  On Monday if she did not have the symptoms anymore but she was very tired and slept all day.  She also had diaphoresis.  On Tuesday she felt lightheaded and dizzy and also felt she had a loss of balance more often.  She reports numbness and tingling in her feet from the knees down.  She reports she cannot feel her feet and this is new.  Because she cannot feel her feet her balance is very poor.  She has been taking gabapentin 300 mg 3 times daily and has not helped. She is taking glipizide 10 mg and pioglitazone 15 mg for her diabetes.  A1c today is 7.1%. She is taking HCTZ 25 mg and metoprolol succinate 50 mg daily.  She reports that she adds lisinopril 10 mg then her blood pressure drops down to 99-89/30 and she feels bad.  So she has not been taking lisinopril 10 mg or amlodipine 10 mg. She has a referral for DEXA but has not heard from them yet. She has a referral for an LDCT but has not heard from them either.    Review of Systems  Gastrointestinal:  Positive for diarrhea and vomiting.      Objective:     BP 126/72   Pulse 69   Temp 98.1 F (36.7 C) (Oral)   Resp 18   Ht 5\' 1"  (1.549 m)   Wt 141 lb (64 kg)   SpO2 97%   BMI 26.64 kg/m    Physical Exam Vitals and nursing note reviewed.   Constitutional:      Appearance: Normal appearance.  HENT:     Head: Normocephalic and atraumatic.  Eyes:     Conjunctiva/sclera: Conjunctivae normal.  Cardiovascular:     Rate and Rhythm: Normal rate and regular rhythm.  Pulmonary:     Effort: Pulmonary effort is normal.     Breath sounds: Normal breath sounds.  Musculoskeletal:     Right lower leg: No edema.     Left lower leg: No edema.  Feet:     Right foot:     Toenail Condition: Right toenails are abnormally thick.     Left foot:     Toenail Condition: Left toenails are abnormally thick.  Skin:    General: Skin is warm and dry.  Neurological:     Mental Status: She is alert and oriented to person, place, and time.  Psychiatric:        Mood and Affect: Mood normal.        Behavior: Behavior normal.        Thought Content: Thought content normal.        Judgment: Judgment normal.       Diabetic foot exam was performed with the following  findings:   No data filed      No results found for any visits on 07/31/23.    The ASCVD Risk score (Arnett DK, et al., 2019) failed to calculate for the following reasons:   Risk score cannot be calculated because patient has a medical history suggesting prior/existing ASCVD    Assessment & Plan:  Type 2 diabetes mellitus with diabetic neuropathy, without long-term current use of insulin (HCC) Assessment & Plan: A1c is 7.1% today.  She is doing a good job with her DMT2.    Orders: -     POCT glycosylated hemoglobin (Hb A1C)  Chronic radicular lumbar pain Assessment & Plan: She has a nerve stimulator in situ.     Other polyneuropathy Assessment & Plan: Likely multifactorial with lumbar radiculopathy and DMT2 causing neuropathy.  Not certain what could make the peripheral neuropathy suddenly worse.  Will check vitamin B12.  She smokes and takes metformin both of which can lower her B12 level.   Her neuropathy increases her falls risk.  She is walking with a cane but  would be safer if she used a rollator   Essential hypertension Assessment & Plan: She is taking hydrochlorothiazide 25mg  daily and metoprolol succinate 50mg  daily.  She states if she adds  lisinopril 10mg  then her BP gets down like 99/30.  She is not taking amlodipine 10mg . Recheck of her BP was 126/72.  She does not need the lisinopril or amlodipine.  May consider stopping hydrochlorothiazide and starting lisinopril 10mg  daily to give her renal protection.        Return Already scheduled appointment.    Alease Medina, MD

## 2023-08-01 LAB — VITAMIN B12: Vitamin B-12: 447 pg/mL (ref 232–1245)

## 2023-08-05 ENCOUNTER — Other Ambulatory Visit: Payer: Self-pay | Admitting: Family Medicine

## 2023-08-05 DIAGNOSIS — E119 Type 2 diabetes mellitus without complications: Secondary | ICD-10-CM

## 2023-08-14 ENCOUNTER — Ambulatory Visit: Payer: 59 | Admitting: Family Medicine

## 2023-08-19 ENCOUNTER — Ambulatory Visit: Payer: Self-pay | Admitting: Family Medicine

## 2023-08-19 ENCOUNTER — Ambulatory Visit (INDEPENDENT_AMBULATORY_CARE_PROVIDER_SITE_OTHER): Admitting: Family Medicine

## 2023-08-19 ENCOUNTER — Encounter: Payer: Self-pay | Admitting: Family Medicine

## 2023-08-19 VITALS — BP 136/81 | HR 63 | Temp 98.1°F | Resp 18 | Ht 61.0 in | Wt 140.0 lb

## 2023-08-19 DIAGNOSIS — G8929 Other chronic pain: Secondary | ICD-10-CM

## 2023-08-19 DIAGNOSIS — M5416 Radiculopathy, lumbar region: Secondary | ICD-10-CM | POA: Diagnosis not present

## 2023-08-19 DIAGNOSIS — G6181 Chronic inflammatory demyelinating polyneuritis: Secondary | ICD-10-CM

## 2023-08-19 DIAGNOSIS — E782 Mixed hyperlipidemia: Secondary | ICD-10-CM | POA: Diagnosis not present

## 2023-08-19 DIAGNOSIS — M5412 Radiculopathy, cervical region: Secondary | ICD-10-CM | POA: Diagnosis not present

## 2023-08-19 DIAGNOSIS — Z7984 Long term (current) use of oral hypoglycemic drugs: Secondary | ICD-10-CM | POA: Diagnosis not present

## 2023-08-19 DIAGNOSIS — E1140 Type 2 diabetes mellitus with diabetic neuropathy, unspecified: Secondary | ICD-10-CM

## 2023-08-19 DIAGNOSIS — F17210 Nicotine dependence, cigarettes, uncomplicated: Secondary | ICD-10-CM | POA: Diagnosis not present

## 2023-08-19 DIAGNOSIS — Z0000 Encounter for general adult medical examination without abnormal findings: Secondary | ICD-10-CM | POA: Insufficient documentation

## 2023-08-19 DIAGNOSIS — E039 Hypothyroidism, unspecified: Secondary | ICD-10-CM | POA: Diagnosis not present

## 2023-08-19 MED ORDER — TIZANIDINE HCL 4 MG PO TABS: 4.0000 mg | ORAL_TABLET | Freq: Three times a day (TID) | ORAL | 0 refills | Status: DC

## 2023-08-19 MED ORDER — GABAPENTIN 300 MG PO CAPS: 300.0000 mg | ORAL_CAPSULE | Freq: Three times a day (TID) | ORAL | 1 refills | Status: DC

## 2023-08-19 NOTE — Assessment & Plan Note (Signed)
 For 925 A1c 7.1%.  She is on Glucotrol  XL 10 mg daily

## 2023-08-19 NOTE — Assessment & Plan Note (Signed)
 Mostly numbness in her feet.  B12 level was normal.  Most likely diabetic peripheral neuropathy.  07/31/2023 A1c 7.1%.  Has improved with  gabapentin  300 mg 3 times daily

## 2023-08-19 NOTE — Assessment & Plan Note (Signed)
 Improved with gabapentin  300mg  TID.  Would like to try a muscle relaxer

## 2023-08-19 NOTE — Assessment & Plan Note (Signed)
 [facility]eds mammogram after 11/19/2023

## 2023-08-19 NOTE — Progress Notes (Signed)
 Established Patient Office Visit  Subjective   Patient ID: Mary Nicholson, female    DOB: 09/18/1955  Age: 69 y.o. MRN: [id number]  Chief Complaint  Patient presents with   Medical Management of Chronic Issues    Fasting    HPI Delightful 68-yo woman with DMT2, GERD, CKD3a, mixed hyperlipidemia, COPD (still smoking), postlaminectomy syndrome, cervical radiculopathy, peripheral neuropathy, schizophrenia, hypothyroidism, s/p hypothyroidism, peripheral neuropathy. She has peripheral neuropathy, numbness in her feet, that appears to be diabetic.  07/31/2023 B12 447.  Been taking gabapentin  300 mg 3 times daily for the numbness in her feet and it is improved.    She also remarks that gabapentin  has helped her neck.  Her neck still feels stiff.  She would like to try muscle relaxer. She has been smoking since she was 68 yo  and she smokes about a pack a day.  She does smoke outside.  Not interested in quitting at this time.  Discussed lung cancer screening with her and she would be willing to get an annual CT scan of her chest. Last office visit 07/31/2023 A1c 7.1%.  She is on Glucotrol  XL 10 mg daily.  She is fasting for the rest of her labs today. 04/19/2023 total cholesterol 219, Triggs 247, HDL 60 and LDL 111.  She is on Lipitor 80 mg daily     ROS    Objective:     BP 136/81 (BP Location: Right Arm, Patient Position: Sitting, Cuff Size: Normal)   Pulse 63   Temp 98.1 F (36.7 C) (Oral)   Resp 18   Ht 5\' 1"  (1.549 m)   Wt 140 lb (63.5 kg)   SpO2 97%   BMI 26.45 kg/m    Physical Exam Vitals and nursing note reviewed.  Constitutional:      Appearance: Normal appearance.  HENT:     Head: Normocephalic and atraumatic.  Eyes:     Conjunctiva/sclera: Conjunctivae normal.  Cardiovascular:     Rate and Rhythm: Normal rate and regular rhythm.  Pulmonary:     Effort: Pulmonary effort is normal.     Breath sounds: Normal breath sounds.  Musculoskeletal:     Right lower  leg: No edema.     Left lower leg: No edema.  Skin:    General: Skin is warm and dry.  Neurological:     Mental Status: She is alert and oriented to person, place, and time.  Psychiatric:        Mood and Affect: Mood normal.        Behavior: Behavior normal.        Thought Content: Thought content normal.        Judgment: Judgment normal.          No results found for any visits on 08/19/23.    The ASCVD Risk score (Arnett DK, et al., 2019) failed to calculate for the following reasons:   Risk score cannot be calculated because patient has a medical history suggesting prior/existing ASCVD    Assessment & Plan:  Type 2 diabetes mellitus with diabetic neuropathy, without long-term current use of insulin  [facility]) Assessment & Plan: For 925 A1c 7.1%.  She is on Glucotrol  XL 10 mg daily  Orders: -     Gabapentin ; Take 1 capsule (300 mg total) by mouth 3 (three) times daily.  Dispense: 270 capsule; Refill: 1 -     Lipid panel -     CBC with Differential/Platelet -     Comprehensive  metabolic panel with GFR -     Microalbumin / creatinine urine ratio  Chronic radicular lumbar pain -     Gabapentin ; Take 1 capsule (300 mg total) by mouth 3 (three) times daily.  Dispense: 270 capsule; Refill: 1  Cigarette nicotine dependence without complication -     CT CHEST LUNG CANCER SCREENING LOW DOSE WO CONTRAST; Future  Acquired hypothyroidism -     TSH + free T4  Mixed hyperlipidemia Assessment & Plan: 04/19/2023 total cholesterol 219, Triggs 347, HDL 60 and LDL 111.  Goal for her is LDL less than 70.  Currently taking atorvastatin  80 mg daily.  Checking cholesterol panel today   Chronic inflammatory demyelinating polyneuropathy (HCC) Assessment & Plan: Mostly numbness in her feet.  B12 level was normal.  Most likely diabetic peripheral neuropathy.  07/31/2023 A1c 7.1%.  Has improved with  gabapentin  300 mg 3 times daily   Healthcare maintenance Assessment & Plan: Neds mammogram  after 11/19/2023   Cervical radiculitis Assessment & Plan: Improved with gabapentin  300mg  TID.  Would like to try a muscle relaxer  Orders: -     tiZANidine HCl; Take 1 tablet (4 mg total) by mouth 3 (three) times daily.  Dispense: 90 tablet; Refill: 0     Return in about 3 months (around 11/18/2023).    Shelli Portilla K Milos Milligan, MD

## 2023-08-19 NOTE — Assessment & Plan Note (Signed)
 04/19/2023 total cholesterol 219, Triggs 347, HDL 60 and LDL 111.  Goal for her is LDL less than 70.  Currently taking atorvastatin  80 mg daily.  Checking cholesterol panel today

## 2023-08-20 LAB — COMPREHENSIVE METABOLIC PANEL WITH GFR
ALT: 14 IU/L (ref 0–32)
AST: 14 IU/L (ref 0–40)
Albumin: 4.8 g/dL (ref 3.9–4.9)
Alkaline Phosphatase: 100 IU/L (ref 44–121)
BUN/Creatinine Ratio: 14 (ref 12–28)
BUN: 11 mg/dL (ref 8–27)
Bilirubin Total: 0.4 mg/dL (ref 0.0–1.2)
CO2: 21 mmol/L (ref 20–29)
Calcium: 10.2 mg/dL (ref 8.7–10.3)
Chloride: 96 mmol/L (ref 96–106)
Creatinine, Ser: 0.76 mg/dL (ref 0.57–1.00)
Globulin, Total: 2.4 g/dL (ref 1.5–4.5)
Glucose: 181 mg/dL — ABNORMAL HIGH (ref 70–99)
Potassium: 4.4 mmol/L (ref 3.5–5.2)
Sodium: 135 mmol/L (ref 134–144)
Total Protein: 7.2 g/dL (ref 6.0–8.5)
eGFR: 84 mL/min/{1.73_m2} (ref >59)

## 2023-08-20 LAB — MICROALBUMIN / CREATININE URINE RATIO
Creatinine, Urine: 88.8 mg/dL
Microalb/Creat Ratio: 43 mg/g{creat} — ABNORMAL HIGH (ref 0–29)
Microalbumin, Urine: 38.6 ug/mL

## 2023-08-20 LAB — TSH+FREE T4
Free T4: 0.98 ng/dL (ref 0.82–1.77)
TSH: 5.96 u[IU]/mL — ABNORMAL HIGH (ref 0.450–4.500)

## 2023-08-20 LAB — CBC WITH DIFFERENTIAL/PLATELET
Basophils Absolute: 0.1 10*3/uL (ref 0.0–0.2)
Basos: 1 %
EOS (ABSOLUTE): 0.1 10*3/uL (ref 0.0–0.4)
Eos: 1 %
Hematocrit: 45.1 % (ref 34.0–46.6)
Hemoglobin: 14.9 g/dL (ref 11.1–15.9)
Immature Grans (Abs): 0 10*3/uL (ref 0.0–0.1)
Immature Granulocytes: 0 %
Lymphocytes Absolute: 2.4 10*3/uL (ref 0.7–3.1)
Lymphs: 27 %
MCH: 30.5 pg (ref 26.6–33.0)
MCHC: 33 g/dL (ref 31.5–35.7)
MCV: 92 fL (ref 79–97)
Monocytes Absolute: 0.6 10*3/uL (ref 0.1–0.9)
Monocytes: 7 %
Neutrophils Absolute: 5.8 10*3/uL (ref 1.4–7.0)
Neutrophils: 64 %
Platelets: 361 10*3/uL (ref 150–450)
RBC: 4.89 x10E6/uL (ref 3.77–5.28)
RDW: 13.6 % (ref 11.7–15.4)
WBC: 9 10*3/uL (ref 3.4–10.8)

## 2023-08-20 LAB — LIPID PANEL
Chol/HDL Ratio: 3.6 ratio (ref 0.0–4.4)
Cholesterol, Total: 150 mg/dL (ref 100–199)
HDL: 42 mg/dL (ref >39)
LDL Chol Calc (NIH): 65 mg/dL (ref 0–99)
Triglycerides: 266 mg/dL — ABNORMAL HIGH (ref 0–149)
VLDL Cholesterol Cal: 43 mg/dL — ABNORMAL HIGH (ref 5–40)

## 2023-08-21 ENCOUNTER — Encounter: Payer: Self-pay | Admitting: Family Medicine

## 2023-09-03 ENCOUNTER — Emergency Department
Admission: EM | Admit: 2023-09-03 | Discharge: 2023-09-03 | Disposition: A | Discharge status: Home / Self Care | Attending: Emergency Medicine | Admitting: Emergency Medicine

## 2023-09-03 ENCOUNTER — Other Ambulatory Visit: Payer: Self-pay

## 2023-09-03 ENCOUNTER — Emergency Department

## 2023-09-03 ENCOUNTER — Encounter: Payer: Self-pay | Admitting: Emergency Medicine

## 2023-09-03 DIAGNOSIS — I129 Hypertensive chronic kidney disease with stage 1 through stage 4 chronic kidney disease, or unspecified chronic kidney disease: Secondary | ICD-10-CM | POA: Insufficient documentation

## 2023-09-03 DIAGNOSIS — N189 Chronic kidney disease, unspecified: Secondary | ICD-10-CM | POA: Insufficient documentation

## 2023-09-03 DIAGNOSIS — M5412 Radiculopathy, cervical region: Secondary | ICD-10-CM | POA: Diagnosis not present

## 2023-09-03 DIAGNOSIS — J449 Chronic obstructive pulmonary disease, unspecified: Secondary | ICD-10-CM | POA: Diagnosis not present

## 2023-09-03 DIAGNOSIS — M25512 Pain in left shoulder: Secondary | ICD-10-CM | POA: Diagnosis not present

## 2023-09-03 DIAGNOSIS — E1122 Type 2 diabetes mellitus with diabetic chronic kidney disease: Secondary | ICD-10-CM | POA: Diagnosis not present

## 2023-09-03 DIAGNOSIS — M19012 Primary osteoarthritis, left shoulder: Secondary | ICD-10-CM | POA: Diagnosis not present

## 2023-09-03 MED ORDER — LIDOCAINE 5 % EX PTCH: 1.0000 | MEDICATED_PATCH | Freq: Two times a day (BID) | CUTANEOUS | 0 refills | Status: AC

## 2023-09-03 MED ORDER — LIDOCAINE 5 % EX PTCH
1.0000 | MEDICATED_PATCH | CUTANEOUS | Status: DC
  Administered 2023-09-03: 1 via TRANSDERMAL
  Filled 2023-09-03: qty 1

## 2023-09-03 MED ORDER — OXYCODONE HCL 5 MG PO TABS: 5.0000 mg | ORAL_TABLET | Freq: Four times a day (QID) | ORAL | 0 refills | Status: AC | PRN

## 2023-09-03 MED ORDER — OXYCODONE-ACETAMINOPHEN 5-325 MG PO TABS
1.0000 | ORAL_TABLET | Freq: Once | ORAL | Status: AC
  Administered 2023-09-03: 1 via ORAL
  Filled 2023-09-03: qty 1

## 2023-09-03 NOTE — ED Provider Notes (Signed)
 [facility] Provider Note    Event Date/Time   First MD Initiated Contact with Patient 09/03/23 2236     (approximate)   History   Chief Complaint Shoulder Pain and Spasms   HPI  Mary Nicholson is a 68 y.o. female with past medical history of hypertension, hyperlipidemia, diabetes, COPD, CKD, and schizophrenia who presents to the ED complaining of shoulder pain.  Patient reports that she has been dealing with increasing pain over the left side of her neck radiating into her left shoulder and arm since yesterday evening.  She denies any falls or other trauma to this area, states she has had issues with her neck in the past but pain has never been this severe.  She states that her arm "feels funny" with some tingling, but she denies any numbness or weakness in the arm.  She has been taking gabapentin  at home along with a muscle relaxer without significant relief in pain.     Physical Exam   Triage Vital Signs: ED Triage Vitals  Encounter Vitals Group     BP 09/03/23 2155 (!) 169/87     Systolic BP Percentile --      Diastolic BP Percentile --      Pulse Rate 09/03/23 2155 100     Resp 09/03/23 2155 18     Temp 09/03/23 2155 98.5 F (36.9 C)     Temp Source 09/03/23 2155 Oral     SpO2 09/03/23 2155 96 %     Weight 09/03/23 2153 140 lb (63.5 kg)     Height 09/03/23 2153 5' (1.524 m)     Head Circumference --      Peak Flow --      Pain Score 09/03/23 2153 10     Pain Loc --      Pain Education --      Exclude from Growth Chart --     Most recent vital signs: Vitals:   09/03/23 2155  BP: (!) 169/87  Pulse: 100  Resp: 18  Temp: 98.5 F (36.9 C)  SpO2: 96%    Constitutional: Alert and oriented. Eyes: Conjunctivae are normal. Head: Atraumatic. Nose: No congestion/rhinnorhea. Mouth/Throat: Mucous membranes are moist.  Neck: Tenderness to palpation over left paraspinal area as well as left trapezius. Cardiovascular: Normal rate,  regular rhythm. Grossly normal heart sounds.  2+ radial pulses bilaterally. Respiratory: Normal respiratory effort.  No retractions. Lungs CTAB. Gastrointestinal: Soft and nontender. No distention. Musculoskeletal: No lower extremity tenderness nor edema.  No tenderness to palpation of the left shoulder, range of motion intact with minimal pain. Neurologic:  Normal speech and language. No gross focal neurologic deficits are appreciated.    ED Results / Procedures / Treatments   Labs (all labs ordered are listed, but only abnormal results are displayed) Labs Reviewed - No data to display   RADIOLOGY Left shoulder x-ray reviewed and interpreted by me with no fracture or dislocation.  PROCEDURES:  Critical Care performed: No  Procedures   MEDICATIONS ORDERED IN ED: Medications  lidocaine  (LIDODERM ) 5 % 1 patch (1 patch Transdermal Patch Applied 09/03/23 2303)  oxyCODONE -acetaminophen  (PERCOCET/ROXICET) 5-325 MG per tablet 1 tablet (1 tablet Oral Given 09/03/23 2303)     IMPRESSION / MDM / ASSESSMENT AND PLAN / ED COURSE  I reviewed the triage vital signs and the nursing notes.  68 y.o. female with past medical history of hypertension, hyperlipidemia, diabetes, COPD, CKD, and schizophrenia who presents to the ED complaining of pain at her left neck radiating into her left arm over the past 24 hours.  Patient's presentation is most consistent with acute complicated illness / injury requiring diagnostic workup.  Differential diagnosis includes, but is not limited to, cervical radiculopathy, cervical myelopathy, cervical strain, fracture, dislocation.  Patient nontoxic-appearing and in no acute distress, vital signs are unremarkable.  Her pain is reproducible with palpation of her left cervical paraspinal area as well as palpation of the left trapezius.  Strength and sensation are intact to her left upper extremity, doubt significant cervical myelopathy  but symptoms do seem consistent with a radiculopathy.  We will treat symptomatically with Lidoderm  patch and dose of pain medication, but she is appropriate for outpatient management with neurosurgery follow-up.  Referral provided and she was counseled to return to the ED for new or worsening symptoms.  Patient and family agree with plan.      FINAL CLINICAL IMPRESSION(S) / ED DIAGNOSES   Final diagnoses:  Cervical radiculopathy     Rx / DC Orders   ED Discharge Orders          Ordered    oxyCODONE  (ROXICODONE ) 5 MG immediate release tablet  Every 6 hours PRN        09/03/23 2258    lidocaine  (LIDODERM ) 5 %  Every 12 hours        09/03/23 2258             Note:  This document was prepared using Dragon voice recognition software and may include unintentional dictation errors.   Twilla Galea, MD 09/03/23 2306

## 2023-09-03 NOTE — ED Triage Notes (Signed)
 Pt presents to the ED via POV with complaints of L shoulder pain x 2 days. She notes taking Gabapentin  and Muscle relaxer today without much improvement. Rates the pain 10/10. A&Ox4 at this time. Denies CP or SOB.

## 2023-09-05 DIAGNOSIS — H02005 Unspecified entropion of left lower eyelid: Secondary | ICD-10-CM | POA: Diagnosis not present

## 2023-09-08 ENCOUNTER — Other Ambulatory Visit: Payer: Self-pay | Admitting: Family Medicine

## 2023-09-08 DIAGNOSIS — E119 Type 2 diabetes mellitus without complications: Secondary | ICD-10-CM

## 2023-09-11 DIAGNOSIS — H02006 Unspecified entropion of left eye, unspecified eyelid: Secondary | ICD-10-CM | POA: Diagnosis not present

## 2023-09-11 DIAGNOSIS — E119 Type 2 diabetes mellitus without complications: Secondary | ICD-10-CM | POA: Diagnosis not present

## 2023-09-11 DIAGNOSIS — M3501 Sicca syndrome with keratoconjunctivitis: Secondary | ICD-10-CM | POA: Diagnosis not present

## 2023-09-12 NOTE — Progress Notes (Unsigned)
 Referring Physician:  No referring provider defined for this encounter.  Primary Physician:  Ziglar, Susan K, MD  History of Present Illness: 09/17/2023 [name] is here today with a chief complaint of neck pain that goes into the top of her left shoulder.  She also has noticed weakness in her left hand and is concerned that she is dropping things often.  Specifically she has numbness in her 2nd through 4th digits.   She has that she feels as though she cannot walk in a straight line and often loses her balance.  Gabapentin  helps her pain some but does not take it away.     Weakness: none Bowel/Bladder Dysfunction: none  Conservative measures:  Physical therapy:  has not participated in for her neck Annette Barters over 1 year ago Multimodal medical therapy including regular antiinflammatories:  tylenol , gabapentin , lidocaine  patches, oxycodone , tizanidine  Injections:  no epidural steroid injections for her neck  Past Surgery:  11/08/21: Thoracic Laminectomy fro Spinal Cord Stimulator Implant([employer])  Mary Nicholson has some symptoms of potential cervical myelopathy.  The symptoms are causing a significant impact on the patient's life.   Review of Systems:  A 10 point review of systems is negative, except for the pertinent positives and negatives detailed in the HPI.  Past Medical History: Past Medical History:  Diagnosis Date   Anemia    Arthritis    Bipolar 1 disorder (HCC)    Cancer (HCC)    cervical   Chronic kidney disease    COPD (chronic obstructive pulmonary disease) (HCC)    Diabetes mellitus without complication (HCC)    Dyspnea    GERD (gastroesophageal reflux disease)    History of kidney stones    Hx of right breast biopsy 1988   RIGHT excisional bx 1988   Hyperlipidemia    Hypertension    Hypothyroidism    Pneumonia    PTSD (post-traumatic stress disorder)    Schizophrenia (HCC)    Spinal stenosis    Stroke (HCC)    mild 2013    Thyroid  disease     Past Surgical History: Past Surgical History:  Procedure Laterality Date   ABDOMINAL HYSTERECTOMY     BACK SURGERY     BREAST BIOPSY Right    negative 1988   BREAST EXCISIONAL BIOPSY Right 1988   CHOLECYSTECTOMY     ESOPHAGOGASTRODUODENOSCOPY (EGD) WITH PROPOFOL  N/A 08/08/2021   Procedure: ESOPHAGOGASTRODUODENOSCOPY (EGD) WITH PROPOFOL ;  Surgeon: Marnee Sink, MD;  Location: [facility];  Service: Endoscopy;  Laterality: N/A;   THORACIC LAMINECTOMY FOR SPINAL CORD STIMULATOR N/A 11/08/2021   Procedure: THORACIC LAMINECTOMY FOR SPINAL CORD STIMULATOR IMPLANT ([employer]);  Surgeon: Jodeen Munch, MD;  Location: [facility] OR[facility];  Service: Neurosurgery;  Laterality: N/A;   TUBAL LIGATION      Allergies: Allergies as of 09/17/2023   (No Known Allergies)    Medications: Outpatient Encounter Medications as of 09/17/2023  Medication Sig   Acetaminophen  (TYLENOL  ARTHRITIS EXT RELIEF PO) Take 1 capsule by mouth 3 (three) times daily.   albuterol  (VENTOLIN  HFA) 108 (90 Base) MCG/ACT inhaler INHALE 1-2 PUFFS BY MOUTH EVERY 6 HOURS AS NEEDED FOR WHEEZE OR SHORTNESS OF BREATH   amLODipine  (NORVASC ) 10 MG tablet TAKE 1 TABLET BY MOUTH EVERY DAY   aspirin 81 MG chewable tablet Chew by mouth.   atorvastatin  (LIPITOR) 80 MG tablet Take 1 tablet (80 mg total) by mouth daily.   b complex vitamins capsule Take 1 capsule by mouth daily.   Biotin  5 MG CAPS Take 1 capsule by mouth daily.   busPIRone  (BUSPAR ) 15 MG tablet Take 1 tablet (15 mg total) by mouth 3 (three) times daily.   carboxymethylcellulose (REFRESH PLUS) 0.5 % SOLN Place 1 drop into both eyes daily. 1 drop in left eye BiD and 1 drop to right eye daily   [EXPIRED] diazepam  (VALIUM ) 5 MG tablet Take 1 tablet (5 mg total) by mouth once for 1 dose. Take 30 min prior to MRI   famotidine  (PEPCID ) 40 MG tablet Take 1 tablet (40 mg total) by mouth daily.   fluticasone  (FLONASE ) 50 MCG/ACT nasal spray SPRAY 2 SPRAYS INTO  EACH NOSTRIL EVERY DAY   Fluticasone -Umeclidin-Vilant (TRELEGY ELLIPTA ) 100-62.5-25 MCG/ACT AEPB INHALE 1 PUFF BY MOUTH EVERY DAY   gabapentin  (NEURONTIN ) 300 MG capsule Take 1 capsule (300 mg total) by mouth 3 (three) times daily.   glipiZIDE  (GLUCOTROL  XL) 10 MG 24 hr tablet Take 1 tablet (10 mg total) by mouth daily.   hydrochlorothiazide  (HYDRODIURIL ) 25 MG tablet Take 1 tablet (25 mg total) by mouth daily.   hydrOXYzine  (ATARAX ) 25 MG tablet Take 25 mg by mouth 2 (two) times daily.   lamoTRIgine  (LAMICTAL ) 100 MG tablet Take 1 tablet (100 mg total) by mouth 2 (two) times daily. cbc   levothyroxine  (SYNTHROID ) 100 MCG tablet Take 1 tablet (100 mcg total) by mouth daily.   lidocaine  (LIDODERM ) 5 % Place 1 patch onto the skin every 12 (twelve) hours. Remove & Discard patch within 12 hours or as directed by MD   lisinopril  (ZESTRIL ) 10 MG tablet Take 10 mg by mouth daily.   metFORMIN  (GLUCOPHAGE ) 1000 MG tablet Take 1 tablet (1,000 mg total) by mouth 2 (two) times daily with a meal.   metoprolol  succinate (TOPROL -XL) 50 MG 24 hr tablet Take 1 tablet (50 mg total) by mouth daily.   Multiple Vitamins-Minerals (CENTRUM SILVER PO) Take 1 each by mouth daily.   mupirocin  ointment (BACTROBAN ) 2 % APPLY TO AFFECTED AREA TWICE A DAY   neomycin-polymyxin b-dexamethasone  (MAXITROL) 3.5-10000-0.1 SUSP    OneTouch Delica Lancets 33G MISC USE 1 EACH ONCE DAILY USE AS INSTRUCTED.   ONETOUCH ULTRA test strip 3 (three) times daily.   oxybutynin  (DITROPAN -XL) 5 MG 24 hr tablet Take 1 tablet (5 mg total) by mouth at bedtime.   oxyCODONE  (ROXICODONE ) 5 MG immediate release tablet Take 1 tablet (5 mg total) by mouth every 6 (six) hours as needed.   pioglitazone  (ACTOS ) 15 MG tablet Take 1 tablet (15 mg total) by mouth daily.   REXULTI 0.5 MG TABS Take 1 tablet by mouth daily.   SODIUM FLUORIDE 5000 PPM 1.1 % PSTE See admin instructions.   traZODone  (DESYREL ) 50 MG tablet Take 1 tablet (50 mg total) by mouth at  bedtime as needed.   venlafaxine  XR (EFFEXOR -XR) 75 MG 24 hr capsule Take 2 capsules (150 mg total) by mouth in the morning and at bedtime.   [DISCONTINUED] tiZANidine  (ZANAFLEX ) 4 MG tablet Take 1 tablet (4 mg total) by mouth 3 (three) times daily.   No facility-administered encounter medications on file as of 09/17/2023.    Social History: Social History   Tobacco Use   Smoking status: Every Day    Current packs/day: 1.00    Average packs/day: 1 pack/day for 57.0 years (57.0 ttl pk-yrs)    Types: Cigarettes    Passive exposure: Current   Smokeless tobacco: Never   Tobacco comments:    Has cut back to 1/2 PPD  Vaping Use   Vaping status: Never Used  Substance Use Topics   Alcohol  use: Yes    Comment: Occasional   Drug use: Never    Family Medical History: Family History  Problem Relation Age of Onset   Diabetes Mother    Heart failure Mother    Ovarian cancer Mother 21       question dx?   Alzheimer's disease Father    COPD Father    Breast cancer Neg Hx     Physical Examination: @VITALWITHPAIN @  General: Patient is well developed, well nourished, calm, collected, and in no apparent distress. Attention to examination is appropriate.  Psychiatric: Patient is non-anxious.  Head:  Pupils equal, round, and reactive to light.  ENT:  Oral mucosa appears well hydrated.  Neck:   Supple.  Some decreased range of motion noted.  Respiratory: Patient is breathing without any difficulty.  Extremities: No edema.  Vascular: Palpable dorsal pedal pulses.  Skin:   On exposed skin, there are no abnormal skin lesions.  NEUROLOGICAL:     Awake, alert, oriented to person, place, and time.  Speech is clear and fluent. Fund of knowledge is appropriate.   Cranial Nerves: Pupils equal round and reactive to light.  Facial tone is symmetric.    ROM of spine: Some minimal tenderness palpation over cervical paraspinals.  Positive Spurling's.   Some positive provocative  maneuvers for carpal tunnel syndrome.  Positive carpal compression test.  Positive Phalen's.   Strength: 5/5 strength bilateral upper extremities with the exception of intrinsic weakness left hand.    Nodule on palmar aspect of hand in close proximity to palmar crease. Reflexes are 2+ and symmetric at the biceps, triceps, brachioradialis. Hoffman's is absent.  Bilateral upper and lower extremity sensation is intact to light touch.    Slight difficulty with tandem gait.  Medical Decision Making  Imaging:  No recent imaging of her cervical spine. Assessment and Plan: Ms. Esmeralda Malay is a pleasant 68 y.o. female is here today with a chief complaint of neck pain that goes into the top of her left shoulder.  Previous implant of spinal cord stimulator.  She also has noticed weakness in her left hand and is concerned that she is dropping things often.  Specifically she has numbness in her 2nd through 4th digits.  She does not feel like her neck pain radiates all the way down from her neck to her hand however.  She has that she feels as though she cannot walk in a straight line and often loses her balance.  Gabapentin  helps her pain some but does not take it away.  On examination,Some minimal tenderness palpation over cervical paraspinals.  Positive Spurling's.  Intrinsic weakness of her left hand noted on examination.  Nodule palmar aspect of hand and close proximally to palmar crease.  Pleasure to see patient in clinic today.  Plan includes the following:  -Cervical x-rays today including flexion-extension - MRI of cervical spine - Physical therapy referral - Referral to neurology for EMG of left upper extremity to rule out carpal tunnel syndrome versus cervical radiculopathy. - Soft tissue ultrasound for left hand due to nodule.   Will review tests once they are complete.  Encouraged patient to reach out to me for any questions or concerns in the future.  Thank you for involving me in the  care of this patient.   I spent a total of 45 minutes in both face-to-face and non-face-to-face activities for this visit on the  date of this encounter including reviewing history, performing detailed history and physical examination, ordering additional tests, dictation of note, and coordination of care.  Ludwig Safer, PA-C Dept. of Neurosurgery

## 2023-09-17 ENCOUNTER — Encounter: Payer: Self-pay | Admitting: Physician Assistant

## 2023-09-17 ENCOUNTER — Ambulatory Visit (INDEPENDENT_AMBULATORY_CARE_PROVIDER_SITE_OTHER): Admitting: Physician Assistant

## 2023-09-17 ENCOUNTER — Ambulatory Visit
Admission: RE | Admit: 2023-09-17 | Discharge: 2023-09-17 | Disposition: A | Discharge status: Home / Self Care | Source: Ambulatory Visit | Attending: Physician Assistant | Admitting: Physician Assistant

## 2023-09-17 ENCOUNTER — Ambulatory Visit
Admission: RE | Admit: 2023-09-17 | Discharge: 2023-09-17 | Disposition: A | Discharge status: Home / Self Care | Attending: Physician Assistant | Admitting: Physician Assistant

## 2023-09-17 VITALS — BP 142/88 | Ht 60.0 in | Wt 142.0 lb

## 2023-09-17 DIAGNOSIS — M4802 Spinal stenosis, cervical region: Secondary | ICD-10-CM | POA: Insufficient documentation

## 2023-09-17 DIAGNOSIS — R2232 Localized swelling, mass and lump, left upper limb: Secondary | ICD-10-CM

## 2023-09-17 DIAGNOSIS — M542 Cervicalgia: Secondary | ICD-10-CM | POA: Diagnosis not present

## 2023-09-17 DIAGNOSIS — R2689 Other abnormalities of gait and mobility: Secondary | ICD-10-CM

## 2023-09-17 DIAGNOSIS — R29898 Other symptoms and signs involving the musculoskeletal system: Secondary | ICD-10-CM

## 2023-09-17 DIAGNOSIS — M50321 Other cervical disc degeneration at C4-C5 level: Secondary | ICD-10-CM | POA: Diagnosis not present

## 2023-09-17 DIAGNOSIS — R200 Anesthesia of skin: Secondary | ICD-10-CM | POA: Diagnosis not present

## 2023-09-17 MED ORDER — DIAZEPAM 5 MG PO TABS: 5.0000 mg | ORAL_TABLET | Freq: Once | ORAL | 0 refills | Status: AC

## 2023-09-18 ENCOUNTER — Other Ambulatory Visit: Payer: Self-pay

## 2023-09-18 DIAGNOSIS — M5412 Radiculopathy, cervical region: Secondary | ICD-10-CM

## 2023-09-18 MED ORDER — TIZANIDINE HCL 4 MG PO TABS: 4.0000 mg | ORAL_TABLET | Freq: Three times a day (TID) | ORAL | 0 refills | Status: AC

## 2023-09-27 ENCOUNTER — Ambulatory Visit: Attending: Physician Assistant

## 2023-09-27 ENCOUNTER — Ambulatory Visit: Admission: RE | Admit: 2023-09-27 | Source: Ambulatory Visit

## 2023-09-30 ENCOUNTER — Ambulatory Visit: Payer: Self-pay | Admitting: Physician Assistant

## 2023-10-02 ENCOUNTER — Ambulatory Visit: Payer: 59

## 2023-10-10 DIAGNOSIS — M3501 Sicca syndrome with keratoconjunctivitis: Secondary | ICD-10-CM | POA: Diagnosis not present

## 2023-10-10 DIAGNOSIS — H02035 Senile entropion of left lower eyelid: Secondary | ICD-10-CM | POA: Diagnosis not present

## 2023-10-15 ENCOUNTER — Other Ambulatory Visit: Payer: Self-pay

## 2023-10-15 DIAGNOSIS — N3281 Overactive bladder: Secondary | ICD-10-CM

## 2023-10-15 DIAGNOSIS — E7801 Familial hypercholesterolemia: Secondary | ICD-10-CM

## 2023-10-15 DIAGNOSIS — J449 Chronic obstructive pulmonary disease, unspecified: Secondary | ICD-10-CM

## 2023-10-15 DIAGNOSIS — K219 Gastro-esophageal reflux disease without esophagitis: Secondary | ICD-10-CM

## 2023-10-15 DIAGNOSIS — E039 Hypothyroidism, unspecified: Secondary | ICD-10-CM

## 2023-10-15 DIAGNOSIS — F32A Depression, unspecified: Secondary | ICD-10-CM

## 2023-10-15 DIAGNOSIS — R1319 Other dysphagia: Secondary | ICD-10-CM

## 2023-10-15 DIAGNOSIS — F3160 Bipolar disorder, current episode mixed, unspecified: Secondary | ICD-10-CM

## 2023-10-15 DIAGNOSIS — E119 Type 2 diabetes mellitus without complications: Secondary | ICD-10-CM

## 2023-10-15 DIAGNOSIS — J301 Allergic rhinitis due to pollen: Secondary | ICD-10-CM

## 2023-10-15 MED ORDER — TRAZODONE HCL 50 MG PO TABS: 50.0000 mg | ORAL_TABLET | Freq: Every evening | ORAL | 0 refills | Status: AC | PRN

## 2023-10-15 MED ORDER — LEVOTHYROXINE SODIUM 100 MCG PO TABS: 100.0000 ug | ORAL_TABLET | Freq: Every day | ORAL | 0 refills | Status: DC

## 2023-10-15 MED ORDER — AMLODIPINE BESYLATE 10 MG PO TABS: 10.0000 mg | ORAL_TABLET | Freq: Every day | ORAL | 0 refills | Status: AC

## 2023-10-15 MED ORDER — OXYBUTYNIN CHLORIDE ER 5 MG PO TB24: 5.0000 mg | ORAL_TABLET | Freq: Every day | ORAL | 0 refills | Status: DC

## 2023-10-15 MED ORDER — LAMOTRIGINE 100 MG PO TABS: 100.0000 mg | ORAL_TABLET | Freq: Two times a day (BID) | ORAL | 0 refills | Status: AC

## 2023-10-15 MED ORDER — METOPROLOL SUCCINATE ER 50 MG PO TB24: 50.0000 mg | ORAL_TABLET | Freq: Every day | ORAL | 0 refills | Status: DC

## 2023-10-15 MED ORDER — HYDROCHLOROTHIAZIDE 25 MG PO TABS: 25.0000 mg | ORAL_TABLET | Freq: Every day | ORAL | 0 refills | Status: DC

## 2023-10-15 MED ORDER — VENLAFAXINE HCL ER 75 MG PO CP24: 150.0000 mg | ORAL_CAPSULE | Freq: Two times a day (BID) | ORAL | 0 refills | Status: AC

## 2023-10-15 MED ORDER — FLUTICASONE PROPIONATE 50 MCG/ACT NA SUSP: NASAL | 0 refills | Status: DC

## 2023-10-15 MED ORDER — PIOGLITAZONE HCL 15 MG PO TABS
15.0000 mg | ORAL_TABLET | Freq: Every day | ORAL | 0 refills | Status: DC
Start: 2023-10-15 — End: 2024-01-24

## 2023-10-15 MED ORDER — BUSPIRONE HCL 15 MG PO TABS: 15.0000 mg | ORAL_TABLET | Freq: Three times a day (TID) | ORAL | 0 refills | Status: AC

## 2023-10-15 MED ORDER — TRELEGY ELLIPTA 100-62.5-25 MCG/ACT IN AEPB: INHALATION_SPRAY | RESPIRATORY_TRACT | 0 refills | Status: DC

## 2023-10-15 MED ORDER — GLIPIZIDE ER 10 MG PO TB24
10.0000 mg | ORAL_TABLET | Freq: Every day | ORAL | 0 refills | Status: DC
Start: 2023-10-15 — End: 2024-01-24

## 2023-10-15 MED ORDER — FAMOTIDINE 40 MG PO TABS: 40.0000 mg | ORAL_TABLET | Freq: Every day | ORAL | 0 refills | Status: DC

## 2023-10-15 MED ORDER — METFORMIN HCL 1000 MG PO TABS: 1000.0000 mg | ORAL_TABLET | Freq: Two times a day (BID) | ORAL | 0 refills | Status: AC

## 2023-10-15 MED ORDER — ATORVASTATIN CALCIUM 80 MG PO TABS: 80.0000 mg | ORAL_TABLET | Freq: Every day | ORAL | 0 refills | Status: DC

## 2023-10-15 MED ORDER — ALBUTEROL SULFATE HFA 108 (90 BASE) MCG/ACT IN AERS
INHALATION_SPRAY | RESPIRATORY_TRACT | 0 refills | Status: AC
Start: 2023-10-15 — End: ?

## 2023-11-06 IMAGING — CR DG MYELOGRAM LUMBAR
3 series · 3 of 3 positions shown · non-contrast
Comparison: Lumbar spine MRI 1153

CLINICAL DATA: Bilateral low back and lower extremity pain and
weakness
TECHNIQUE: Contiguous axial images were obtained through the Lumbar spine after
the intrathecal infusion of infusion. Coronal and sagittal
reconstructions were obtained of the axial image sets.

[cp_standard]
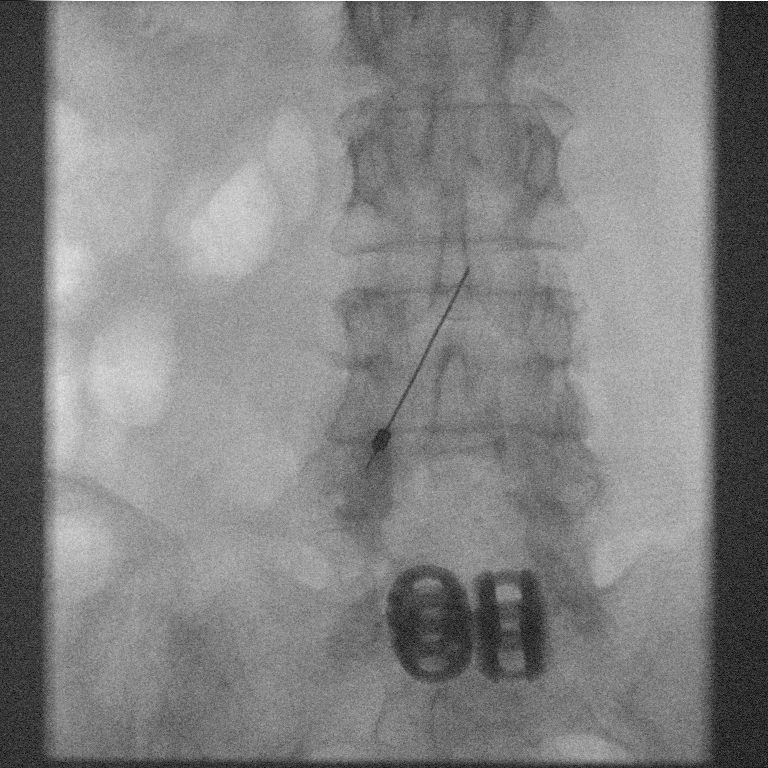

[t lumbar spine ap]
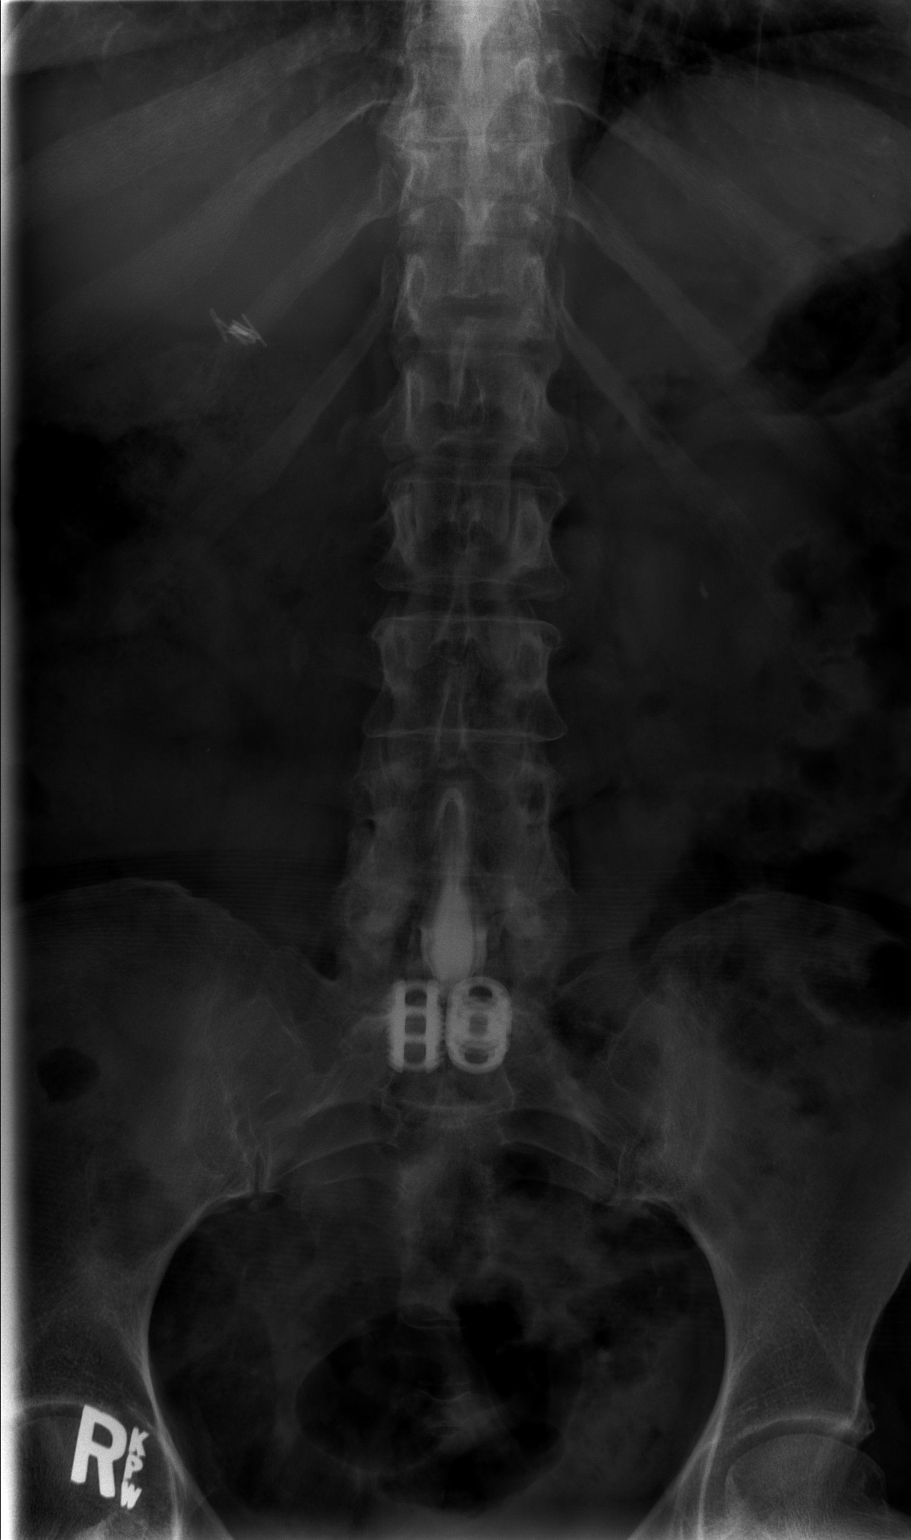

[t lumbar spine lat]
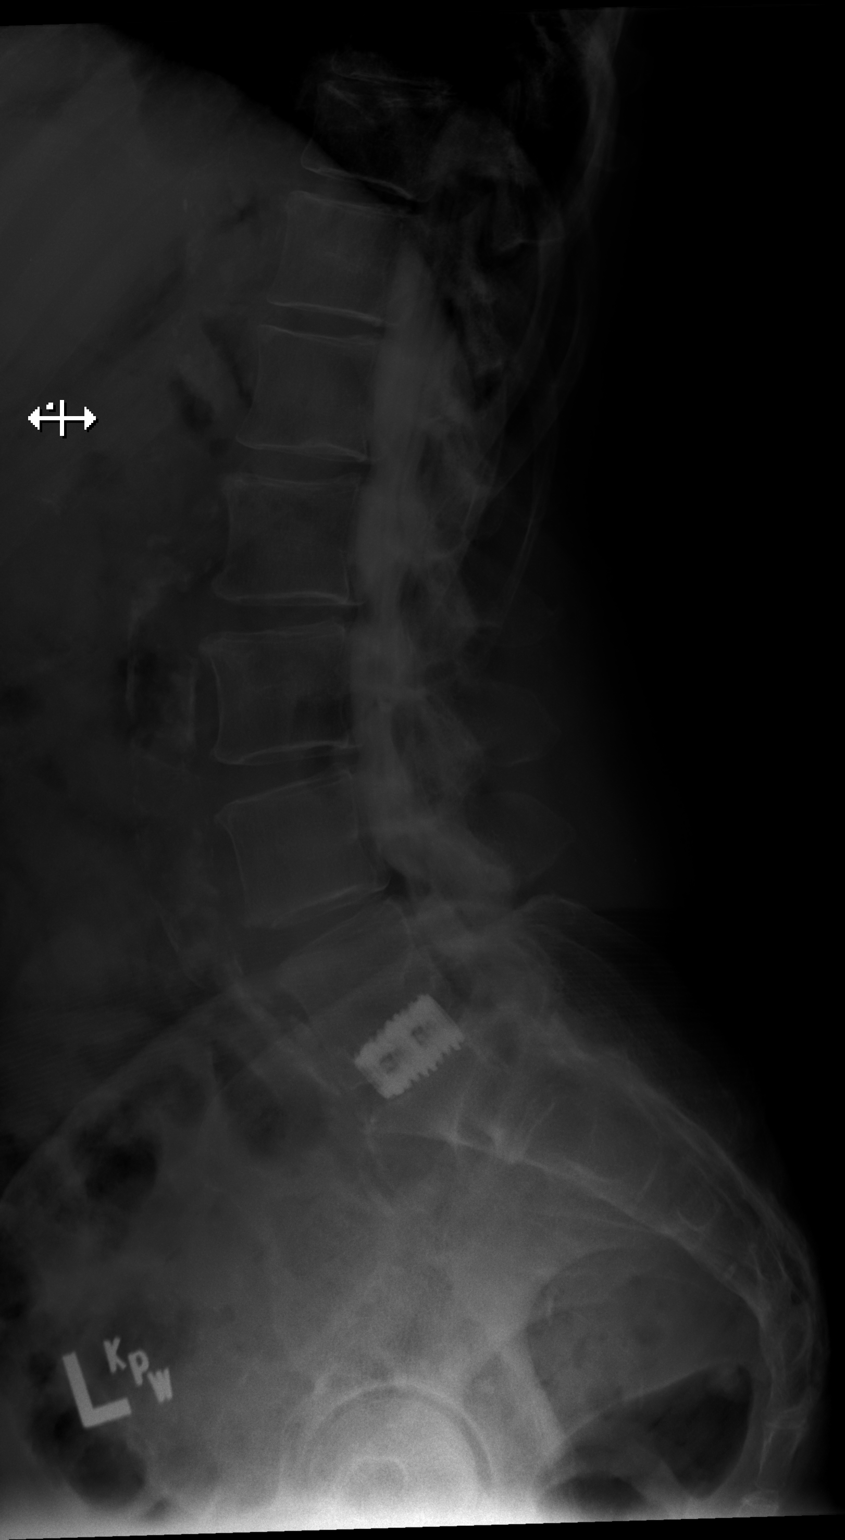

[3 of 3 positions shown; findings below may reference images not displayed]

EXAM:
LUMBAR MYELOGRAM

FLUOROSCOPY TIME:  12 seconds; 1.6 mGy

PROCEDURE:
After thorough discussion of risks and benefits of the procedure
including bleeding, infection, injury to nerves, blood vessels,
adjacent structures as well as headache and CSF leak, written and
oral informed consent was obtained. Consent was obtained by Dr.
Denisse Lieb. Time out form was completed.

Patient was positioned prone on the fluoroscopy table. Local
anesthesia was provided with 1% lidocaine without epinephrine after
prepped and draped in the usual sterile fashion. Puncture was
performed at L3-L4 using a 3 1/2 inch 22-gauge spinal needle via
paramedian approach. Using a single pass through the dura, the
needle was placed within the thecal sac, with return of clear CSF.
18 mL of Omnipaque 180 was injected into the thecal sac, with normal
opacification of the nerve roots and cauda equina consistent with
free flow within the subarachnoid space.

I personally performed the lumbar puncture and administered the
intrathecal contrast. I also personally supervised acquisition of
the myelogram images.
FINDINGS: LUMBAR MYELOGRAM FINDINGS:

Vertebral body heights and alignment are maintained. There is an
interbody fusion device at L5-S1. Ventral epidural filling defects
are present at this levels reflecting disc bulges, greatest at
L4-L5.

CT LUMBAR MYELOGRAM FINDINGS:

Anteroposterior alignment is maintained. Vertebral body heights are
preserved. There is an interbody fusion device at L5-S1.
Laminectomies are present at this level. No evidence of
complication. There is no destructive osseous lesion. There is
adequate opacification of the subarachnoid space. Conus terminates
at the L1-L2 level. There is no clumping of cauda equina nerve
roots.

L1-L2: Mild facet hypertrophy. No significant canal or foraminal
stenosis.

L2-L3: Minimal disc bulge. Mild facet hypertrophy with ligamentum
flavum thickening. No significant canal or foraminal stenosis.

L3-L4: Minimal disc bulge. Mild facet hypertrophy with ligamentum
flavum thickening. Small focus of dorsal epidural air related to
lumbar puncture. No significant canal or foraminal stenosis.

L4-L5: Disc bulge. Moderate facet hypertrophy with ligamentum flavum
thickening. Small focus of left lateral epidural air related to
lumbar puncture. No significant canal stenosis. Mild foraminal
stenosis.

L5-S1: Postoperative level. Small endplate osteophytes are present.
Mild to moderate facet hypertrophy. The spinal canal is
decompressed. There is no foraminal stenosis.
IMPRESSION: Technically successful fluoroscopic guided lumbar puncture for CT
myelogram.

Lumbar degenerative and postoperative changes as detailed above.
There is no significant canal or foraminal stenosis. No substantial
progression since 1153 MRI.

## 2023-11-06 IMAGING — CT CT L SPINE W/ CM
3 of 4 series · 12 of 33 positions shown, 13 images · non-contrast
Comparison: Lumbar spine MRI 1153

CLINICAL DATA: Bilateral low back and lower extremity pain and
weakness
TECHNIQUE: Contiguous axial images were obtained through the Lumbar spine after
the intrathecal infusion of infusion. Coronal and sagittal
reconstructions were obtained of the axial image sets.

[Series 4: l spine soft · axial · 0.33mm/px · z∈[-842,-688]mm · 4 of 113 slices shown, 5 images]
[im 18/113  soft-tissue]
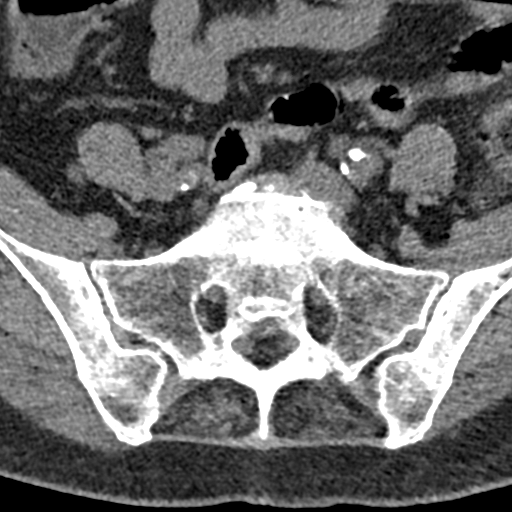
[im 18/113  bone]
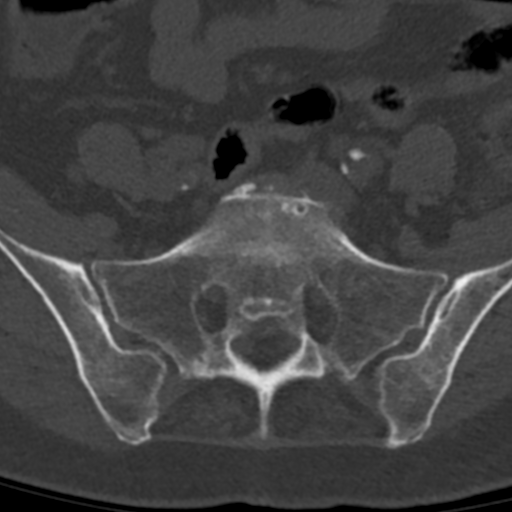
[im 44/113  bone]
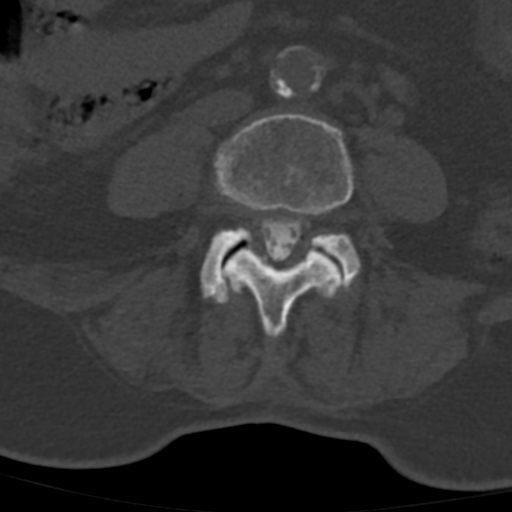
[im 69/113  bone]
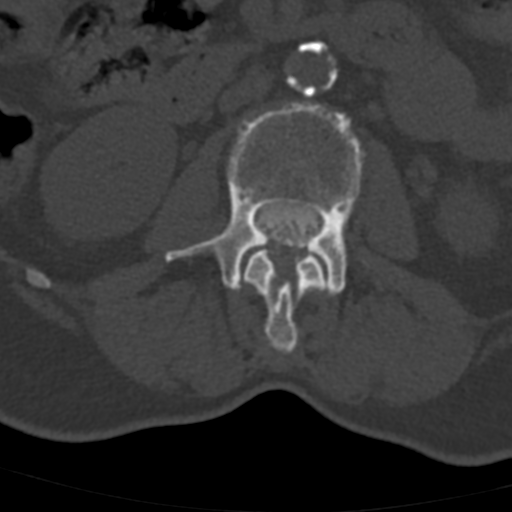
[im 95/113  bone]
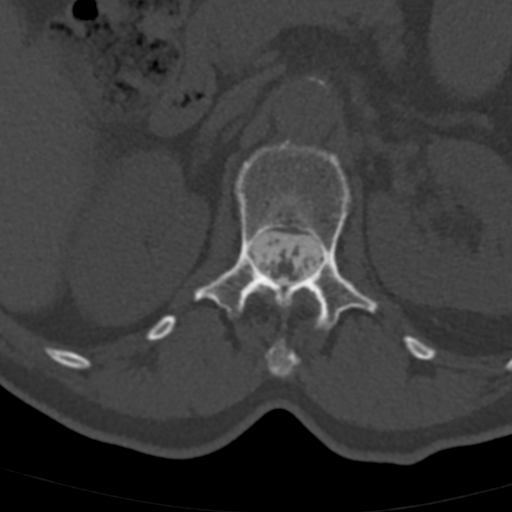

[Series 6: coronal bone · coronal · 0.30mm/px · 3 of 80 slices shown]
[im 16/80  bone]
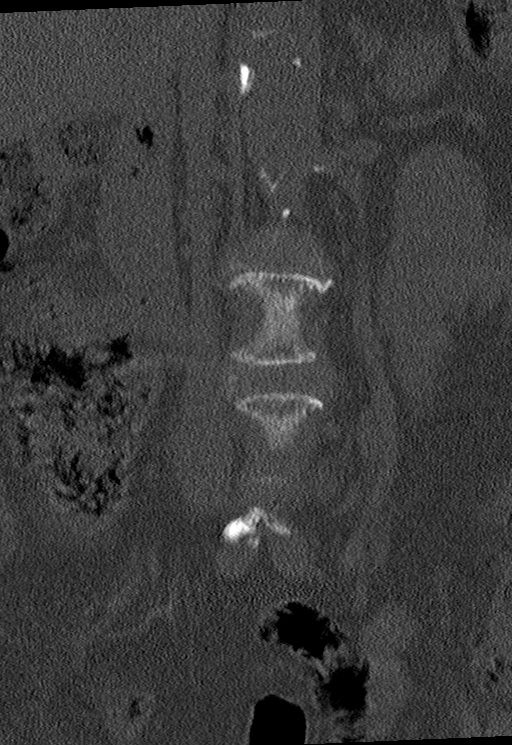
[im 32/80  bone]
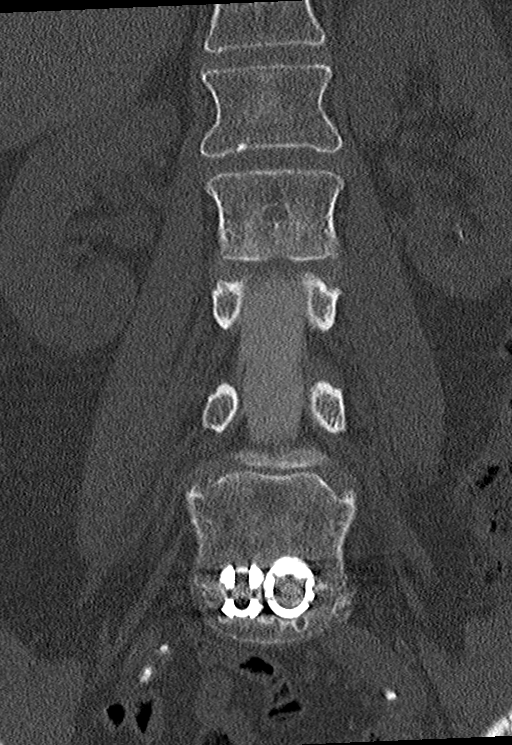
[im 48/80  bone]
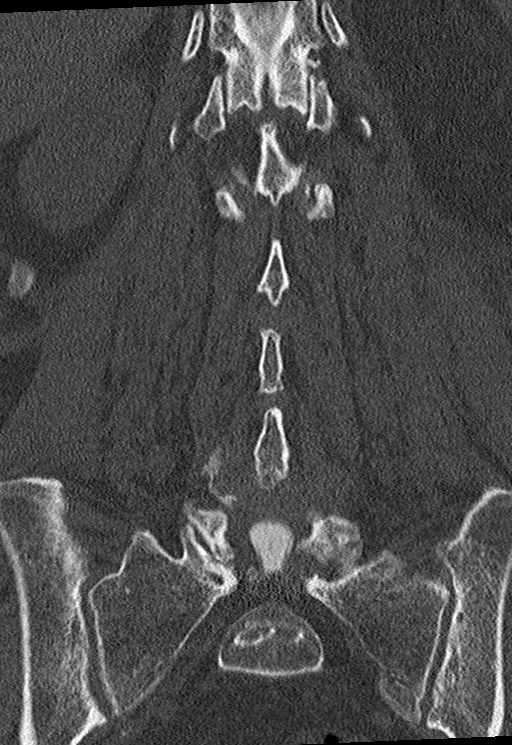

[Series 7: sagittal st · sagittal · 0.31mm/px · 5 of 79 slices shown]
[im 14/79  bone]
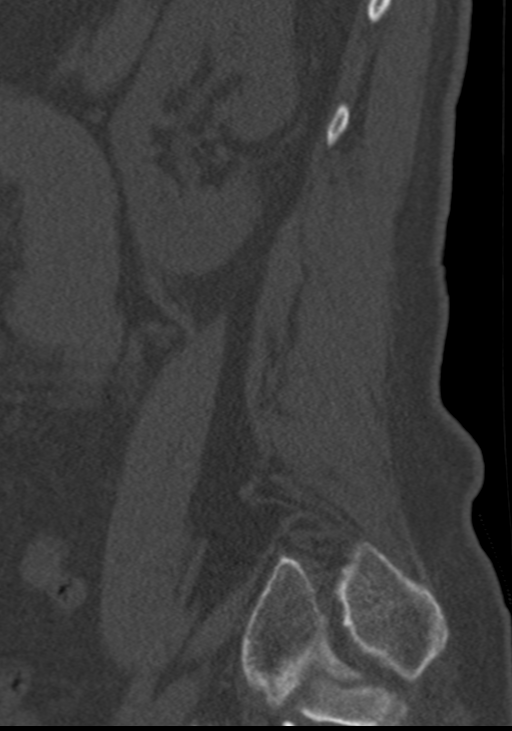
[im 27/79  bone]
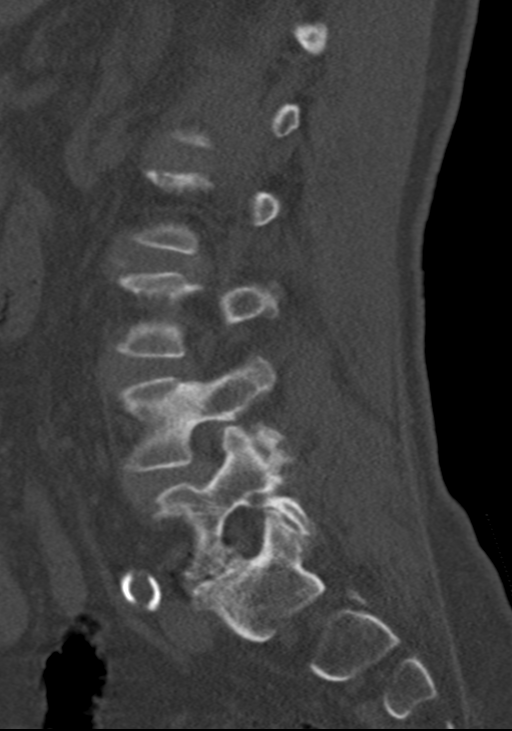
[im 40/79  bone]
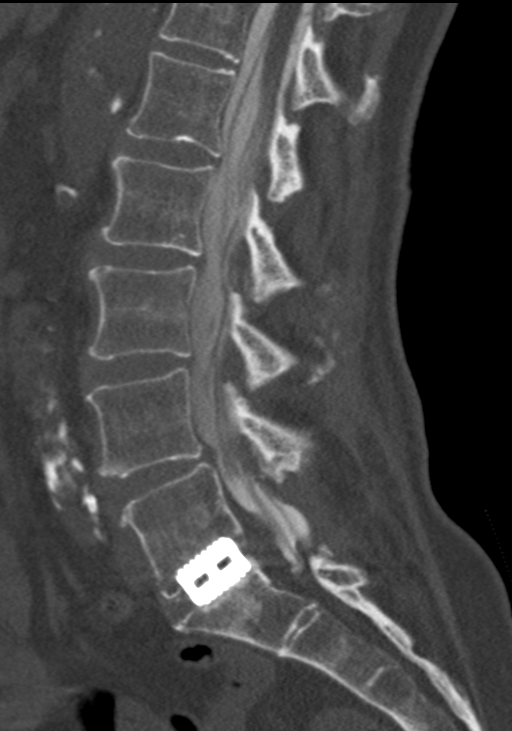
[im 53/79  bone]
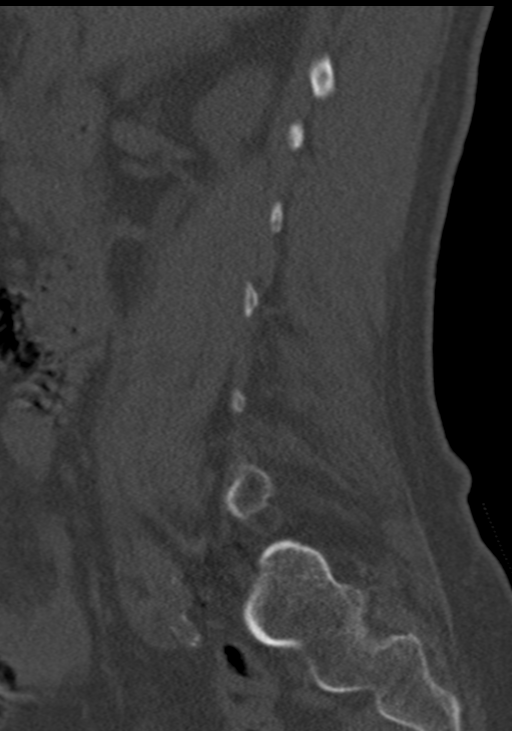
[im 66/79  bone]
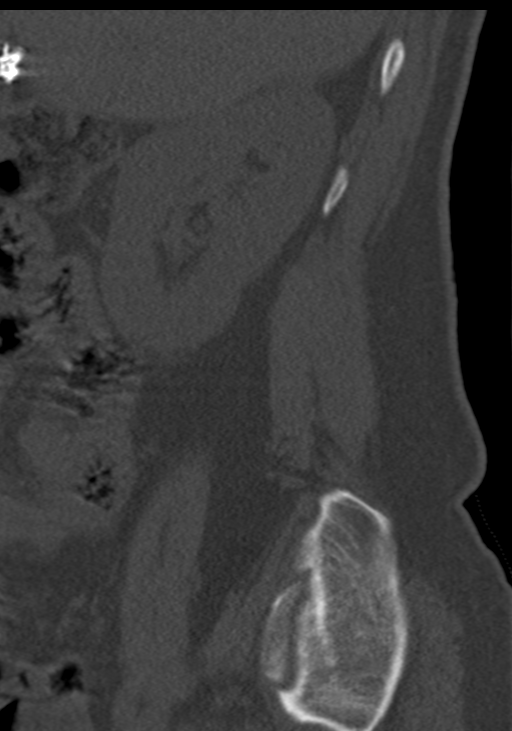

[12 of 33 positions shown; findings below may reference images not displayed]

EXAM:
LUMBAR MYELOGRAM

FLUOROSCOPY TIME:  12 seconds; 1.6 mGy

PROCEDURE:
After thorough discussion of risks and benefits of the procedure
including bleeding, infection, injury to nerves, blood vessels,
adjacent structures as well as headache and CSF leak, written and
oral informed consent was obtained. Consent was obtained by Dr.
Denisse Lieb. Time out form was completed.

Patient was positioned prone on the fluoroscopy table. Local
anesthesia was provided with 1% lidocaine without epinephrine after
prepped and draped in the usual sterile fashion. Puncture was
performed at L3-L4 using a 3 1/2 inch 22-gauge spinal needle via
paramedian approach. Using a single pass through the dura, the
needle was placed within the thecal sac, with return of clear CSF.
18 mL of Omnipaque 180 was injected into the thecal sac, with normal
opacification of the nerve roots and cauda equina consistent with
free flow within the subarachnoid space.

I personally performed the lumbar puncture and administered the
intrathecal contrast. I also personally supervised acquisition of
the myelogram images.
FINDINGS: LUMBAR MYELOGRAM FINDINGS:

Vertebral body heights and alignment are maintained. There is an
interbody fusion device at L5-S1. Ventral epidural filling defects
are present at this levels reflecting disc bulges, greatest at
L4-L5.

CT LUMBAR MYELOGRAM FINDINGS:

Anteroposterior alignment is maintained. Vertebral body heights are
preserved. There is an interbody fusion device at L5-S1.
Laminectomies are present at this level. No evidence of
complication. There is no destructive osseous lesion. There is
adequate opacification of the subarachnoid space. Conus terminates
at the L1-L2 level. There is no clumping of cauda equina nerve
roots.

L1-L2: Mild facet hypertrophy. No significant canal or foraminal
stenosis.

L2-L3: Minimal disc bulge. Mild facet hypertrophy with ligamentum
flavum thickening. No significant canal or foraminal stenosis.

L3-L4: Minimal disc bulge. Mild facet hypertrophy with ligamentum
flavum thickening. Small focus of dorsal epidural air related to
lumbar puncture. No significant canal or foraminal stenosis.

L4-L5: Disc bulge. Moderate facet hypertrophy with ligamentum flavum
thickening. Small focus of left lateral epidural air related to
lumbar puncture. No significant canal stenosis. Mild foraminal
stenosis.

L5-S1: Postoperative level. Small endplate osteophytes are present.
Mild to moderate facet hypertrophy. The spinal canal is
decompressed. There is no foraminal stenosis.
IMPRESSION: Technically successful fluoroscopic guided lumbar puncture for CT
myelogram.

Lumbar degenerative and postoperative changes as detailed above.
There is no significant canal or foraminal stenosis. No substantial
progression since 1153 MRI.

## 2023-11-12 ENCOUNTER — Encounter: Payer: Self-pay | Admitting: Physician Assistant

## 2023-11-12 ENCOUNTER — Ambulatory Visit (INDEPENDENT_AMBULATORY_CARE_PROVIDER_SITE_OTHER): Admitting: Physician Assistant

## 2023-11-12 VITALS — BP 132/74 | Ht 60.0 in | Wt 142.0 lb

## 2023-11-12 DIAGNOSIS — M4802 Spinal stenosis, cervical region: Secondary | ICD-10-CM | POA: Diagnosis not present

## 2023-11-12 DIAGNOSIS — M5412 Radiculopathy, cervical region: Secondary | ICD-10-CM | POA: Diagnosis not present

## 2023-11-12 DIAGNOSIS — G8929 Other chronic pain: Secondary | ICD-10-CM | POA: Diagnosis not present

## 2023-11-12 DIAGNOSIS — E1140 Type 2 diabetes mellitus with diabetic neuropathy, unspecified: Secondary | ICD-10-CM

## 2023-11-12 MED ORDER — GABAPENTIN 300 MG PO CAPS: 300.0000 mg | ORAL_CAPSULE | Freq: Three times a day (TID) | ORAL | 1 refills | Status: AC

## 2023-11-12 NOTE — Progress Notes (Signed)
 Referring Physician:  Ziglar, Susan K, MD [address] [place],  [place] [address]  Primary Physician:  Ziglar, Susan K, MD  History of Present Illness:  [name] is here for follow-up on her continued neck pain that was primarily radiating to her left arm causing numbness and tingling in her 2nd through 4th digits.  She now however is also having pain, numbness and tingling radiating into her right hand.  She has found that she is dropping things often and having tremendous trouble with her fine motor skills.  She adds that she cannot walk in a straight line and is concerned because she constantly feels off balance.  No saddle anesthesia or incontinence of bowel or bladder.   Weakness: none Bowel/Bladder Dysfunction: none  Conservative measures:  Physical therapy:  has not participated in for her neck Jackquline over 1 year ago Multimodal medical therapy including regular antiinflammatories:  tylenol , gabapentin , lidocaine  patches, oxycodone , tizanidine  Injections:  no epidural steroid injections for her neck  Past Surgery:  11/08/21: Thoracic Laminectomy fro Spinal Cord Stimulator Implant([employer])  Brentney Goldbach has some symptoms of potential cervical myelopathy.  The symptoms are causing a significant impact on the patient's life.   Review of Systems:  A 10 point review of systems is negative, except for the pertinent positives and negatives detailed in the HPI.  Past Medical History: Past Medical History:  Diagnosis Date   Anemia    Arthritis    Bipolar 1 disorder (HCC)    Cancer (HCC)    cervical   Chronic kidney disease    COPD (chronic obstructive pulmonary disease) (HCC)    Diabetes mellitus without complication (HCC)    Dyspnea    GERD (gastroesophageal reflux disease)    History of kidney stones    Hx of right breast biopsy 1988   RIGHT excisional bx 1988   Hyperlipidemia    Hypertension    Hypothyroidism    Pneumonia    PTSD  (post-traumatic stress disorder)    Schizophrenia (HCC)    Spinal stenosis    Stroke (HCC)    mild 2013   Thyroid  disease     Past Surgical History: Past Surgical History:  Procedure Laterality Date   ABDOMINAL HYSTERECTOMY     BACK SURGERY     BREAST BIOPSY Right    negative 1988   BREAST EXCISIONAL BIOPSY Right 1988   CHOLECYSTECTOMY     ESOPHAGOGASTRODUODENOSCOPY (EGD) WITH PROPOFOL  N/A 08/08/2021   Procedure: ESOPHAGOGASTRODUODENOSCOPY (EGD) WITH PROPOFOL ;  Surgeon: Jinny Carmine, MD;  Location: [facility];  Service: Endoscopy;  Laterality: N/A;   THORACIC LAMINECTOMY FOR SPINAL CORD STIMULATOR N/A 11/08/2021   Procedure: THORACIC LAMINECTOMY FOR SPINAL CORD STIMULATOR IMPLANT ([employer]);  Surgeon: Clois Fret, MD;  Location: [facility] ORS;  Service: Neurosurgery;  Laterality: N/A;   TUBAL LIGATION      Allergies: Allergies as of 11/12/2023   (No Known Allergies)    Medications: Outpatient Encounter Medications as of 11/12/2023  Medication Sig   Acetaminophen  (TYLENOL  ARTHRITIS EXT RELIEF PO) Take 1 capsule by mouth 3 (three) times daily.   albuterol  (VENTOLIN  HFA) 108 (90 Base) MCG/ACT inhaler INHALE 1-2 PUFFS BY MOUTH EVERY 6 HOURS AS NEEDED FOR WHEEZE OR SHORTNESS OF BREATH   amLODipine  (NORVASC ) 10 MG tablet Take 1 tablet (10 mg total) by mouth daily.   aspirin 81 MG chewable tablet Chew by mouth.   atorvastatin  (LIPITOR) 80 MG tablet Take 1 tablet (80 mg total) by mouth daily.  b complex vitamins capsule Take 1 capsule by mouth daily.   Biotin 5 MG CAPS Take 1 capsule by mouth daily.   busPIRone  (BUSPAR ) 15 MG tablet Take 1 tablet (15 mg total) by mouth 3 (three) times daily.   carboxymethylcellulose (REFRESH PLUS) 0.5 % SOLN Place 1 drop into both eyes daily. 1 drop in left eye BiD and 1 drop to right eye daily   famotidine  (PEPCID ) 40 MG tablet Take 1 tablet (40 mg total) by mouth daily.   fluticasone  (FLONASE ) 50 MCG/ACT nasal spray SPRAY 2 SPRAYS INTO EACH  NOSTRIL EVERY DAY   Fluticasone -Umeclidin-Vilant (TRELEGY ELLIPTA ) 100-62.5-25 MCG/ACT AEPB INHALE 1 PUFF BY MOUTH EVERY DAY   gabapentin  (NEURONTIN ) 300 MG capsule Take 1 capsule (300 mg total) by mouth 3 (three) times daily.   glipiZIDE  (GLUCOTROL  XL) 10 MG 24 hr tablet Take 1 tablet (10 mg total) by mouth daily.   hydrochlorothiazide  (HYDRODIURIL ) 25 MG tablet Take 1 tablet (25 mg total) by mouth daily.   hydrOXYzine  (ATARAX ) 25 MG tablet Take 25 mg by mouth 2 (two) times daily.   lamoTRIgine  (LAMICTAL ) 100 MG tablet Take 1 tablet (100 mg total) by mouth 2 (two) times daily. cbc   levothyroxine  (SYNTHROID ) 100 MCG tablet Take 1 tablet (100 mcg total) by mouth daily.   lidocaine  (LIDODERM ) 5 % Place 1 patch onto the skin every 12 (twelve) hours. Remove & Discard patch within 12 hours or as directed by MD   lisinopril  (ZESTRIL ) 10 MG tablet Take 10 mg by mouth daily.   metFORMIN  (GLUCOPHAGE ) 1000 MG tablet Take 1 tablet (1,000 mg total) by mouth 2 (two) times daily with a meal.   metoprolol  succinate (TOPROL -XL) 50 MG 24 hr tablet Take 1 tablet (50 mg total) by mouth daily.   Multiple Vitamins-Minerals (CENTRUM SILVER PO) Take 1 each by mouth daily.   mupirocin  ointment (BACTROBAN ) 2 % APPLY TO AFFECTED AREA TWICE A DAY   neomycin-polymyxin b-dexamethasone  (MAXITROL) 3.5-10000-0.1 SUSP    OneTouch Delica Lancets 33G MISC USE 1 EACH ONCE DAILY USE AS INSTRUCTED.   ONETOUCH ULTRA test strip 3 (three) times daily.   oxybutynin  (DITROPAN -XL) 5 MG 24 hr tablet Take 1 tablet (5 mg total) by mouth at bedtime.   oxyCODONE  (ROXICODONE ) 5 MG immediate release tablet Take 1 tablet (5 mg total) by mouth every 6 (six) hours as needed.   pioglitazone  (ACTOS ) 15 MG tablet Take 1 tablet (15 mg total) by mouth daily.   REXULTI 0.5 MG TABS Take 1 tablet by mouth daily.   SODIUM FLUORIDE 5000 PPM 1.1 % PSTE See admin instructions.   tiZANidine  (ZANAFLEX ) 4 MG tablet Take 1 tablet (4 mg total) by mouth 3 (three)  times daily.   traZODone  (DESYREL ) 50 MG tablet Take 1 tablet (50 mg total) by mouth at bedtime as needed.   venlafaxine  XR (EFFEXOR -XR) 75 MG 24 hr capsule Take 2 capsules (150 mg total) by mouth in the morning and at bedtime.   No facility-administered encounter medications on file as of 11/12/2023.    Social History: Social History   Tobacco Use   Smoking status: Every Day    Current packs/day: 1.00    Average packs/day: 1 pack/day for 57.0 years (57.0 ttl pk-yrs)    Types: Cigarettes    Passive exposure: Current   Smokeless tobacco: Never   Tobacco comments:    Has cut back to 1/2 PPD  Vaping Use   Vaping status: Never Used  Substance Use Topics  Alcohol  use: Yes    Comment: Occasional   Drug use: Never    Family Medical History: Family History  Problem Relation Age of Onset   Diabetes Mother    Heart failure Mother    Ovarian cancer Mother 37       question dx?   Alzheimer's disease Father    COPD Father    Breast cancer Neg Hx     Physical Examination: @VITALWITHPAIN @  General: Patient is well developed, well nourished, calm, collected, and in no apparent distress. Attention to examination is appropriate.  Psychiatric: Patient is non-anxious.  Head:  Pupils equal, round, and reactive to light.  ENT:  Oral mucosa appears well hydrated.  Neck:   Supple.  Some decreased range of motion noted.  Respiratory: Patient is breathing without any difficulty.  Extremities: No edema.  Vascular: Palpable dorsal pedal pulses.  Skin:   On exposed skin, there are no abnormal skin lesions.  NEUROLOGICAL:     Awake, alert, oriented to person, place, and time.  Speech is clear and fluent. Fund of knowledge is appropriate.   Cranial Nerves: Pupils equal round and reactive to light.  Facial tone is symmetric.    ROM of spine: Some minimal tenderness palpation over cervical paraspinals.  Positive Spurling's.   Some positive provocative maneuvers for carpal tunnel  syndrome.  Positive carpal compression test.  Positive Phalen's.   Strength: 5/5 strength bilateral upper extremities with the exception of intrinsic weakness right and left hand.    Nodule on palmar aspect of hand in close proximity to palmar crease. Reflexes are 2+ and symmetric at the biceps, triceps, brachioradialis. Hoffman's is absent.  Bilateral upper and lower extremity sensation is intact to light touch.    Slight difficulty with tandem gait.  Medical Decision Making  Imaging:  EXAM: CERVICAL SPINE - COMPLETE 4+ VIEW   COMPARISON:  None Available.   FINDINGS: Normal alignment. No abnormal motion on flexion or extension. No listhesis. Mild anterior spurring at C4-C5 and C5-C6 with slight disc space narrowing. There is mild multilevel facet hypertrophy. No evidence of fracture or focal bone abnormality. No prevertebral soft tissue thickening. Carotid calcifications.   IMPRESSION: 1. Mild degenerative disc disease at C4-C5 and C5-C6. 2. Mild multilevel facet hypertrophy. 3. No abnormal motion on flexion or extension.     Assessment and Plan: Ms. Luzelena Heeg is a pleasant 68 y.o. female is here today with a chief complaint of neck pain that goes into the top of her left shoulder.  Previous implant of spinal cord stimulator.  She also has noticed weakness in her left hand and is concerned that she is dropping things often.  Specifically she has numbness in her 2nd through 4th digits.  She does not feel like her neck pain radiates all the way down from her neck to her hand however.  She has that she feels as though she cannot walk in a straight line and often loses her balance.  Gabapentin  helps her pain some but does not take it away.  On examination,Some minimal tenderness palpation over cervical paraspinals.  Positive Spurling's.  Intrinsic weakness of her left hand noted on examination.  Nodule palmar aspect of hand and close proximally to palmar crease.  On examination  patient noted to have some intrinsic hand weakness.  Pleasure to see patient in clinic today.  Unfortunately patient was unable to start physical therapy, undergo her EMG or get her MRI.  Plan moving forward includes the following:  -Scheduling  cervical MRI - EMG of upper extremities to evaluate cervical radiculopathy versus peripheral neuropathy - Begin physical therapy  Will review tests once they are complete.  Encouraged patient to reach out to me for any questions or concerns in the future.  Thank you for involving me in the care of this patient.    Lyle Decamp, PA-C Dept. of Neurosurgery

## 2023-11-13 ENCOUNTER — Encounter: Payer: Self-pay | Admitting: Neurology

## 2023-11-14 ENCOUNTER — Other Ambulatory Visit: Payer: Self-pay

## 2023-11-14 DIAGNOSIS — R202 Paresthesia of skin: Secondary | ICD-10-CM

## 2023-11-14 DIAGNOSIS — H02035 Senile entropion of left lower eyelid: Secondary | ICD-10-CM | POA: Diagnosis not present

## 2023-11-15 ENCOUNTER — Other Ambulatory Visit: Payer: Self-pay | Admitting: Family Medicine

## 2023-11-15 DIAGNOSIS — E7801 Familial hypercholesterolemia: Secondary | ICD-10-CM

## 2023-11-18 ENCOUNTER — Ambulatory Visit: Admitting: Family Medicine

## 2023-11-20 ENCOUNTER — Other Ambulatory Visit: Payer: Self-pay | Admitting: Family Medicine

## 2023-11-20 ENCOUNTER — Ambulatory Visit
Admission: RE | Admit: 2023-11-20 | Discharge: 2023-11-20 | Disposition: A | Discharge status: Home / Self Care | Source: Ambulatory Visit | Attending: Physician Assistant | Admitting: Physician Assistant

## 2023-11-20 DIAGNOSIS — M5021 Other cervical disc displacement,  high cervical region: Secondary | ICD-10-CM | POA: Diagnosis not present

## 2023-11-20 DIAGNOSIS — M4802 Spinal stenosis, cervical region: Secondary | ICD-10-CM | POA: Insufficient documentation

## 2023-11-20 DIAGNOSIS — M50321 Other cervical disc degeneration at C4-C5 level: Secondary | ICD-10-CM | POA: Diagnosis not present

## 2023-11-20 DIAGNOSIS — M47812 Spondylosis without myelopathy or radiculopathy, cervical region: Secondary | ICD-10-CM | POA: Diagnosis not present

## 2023-11-20 DIAGNOSIS — J449 Chronic obstructive pulmonary disease, unspecified: Secondary | ICD-10-CM

## 2023-11-20 NOTE — Telephone Encounter (Signed)
 Refused Albuterol  inhaler because pt has established care with Dr. Susan Ziglar at [facility] on 06/10/2023.

## 2023-11-25 ENCOUNTER — Encounter: Payer: Self-pay | Admitting: Family Medicine

## 2023-11-25 ENCOUNTER — Ambulatory Visit (INDEPENDENT_AMBULATORY_CARE_PROVIDER_SITE_OTHER): Admitting: Family Medicine

## 2023-11-25 VITALS — BP 162/88 | HR 80 | Temp 98.4°F | Resp 18 | Ht 60.0 in | Wt 143.0 lb

## 2023-11-25 DIAGNOSIS — Z7984 Long term (current) use of oral hypoglycemic drugs: Secondary | ICD-10-CM | POA: Diagnosis not present

## 2023-11-25 DIAGNOSIS — E559 Vitamin D deficiency, unspecified: Secondary | ICD-10-CM | POA: Diagnosis not present

## 2023-11-25 DIAGNOSIS — N3941 Urge incontinence: Secondary | ICD-10-CM

## 2023-11-25 DIAGNOSIS — N1832 Chronic kidney disease, stage 3b: Secondary | ICD-10-CM | POA: Diagnosis not present

## 2023-11-25 DIAGNOSIS — E782 Mixed hyperlipidemia: Secondary | ICD-10-CM | POA: Diagnosis not present

## 2023-11-25 DIAGNOSIS — E1140 Type 2 diabetes mellitus with diabetic neuropathy, unspecified: Secondary | ICD-10-CM

## 2023-11-25 DIAGNOSIS — I10 Essential (primary) hypertension: Secondary | ICD-10-CM

## 2023-11-25 DIAGNOSIS — M5412 Radiculopathy, cervical region: Secondary | ICD-10-CM

## 2023-11-25 DIAGNOSIS — E039 Hypothyroidism, unspecified: Secondary | ICD-10-CM

## 2023-11-25 DIAGNOSIS — Z0000 Encounter for general adult medical examination without abnormal findings: Secondary | ICD-10-CM

## 2023-11-25 DIAGNOSIS — G6181 Chronic inflammatory demyelinating polyneuritis: Secondary | ICD-10-CM

## 2023-11-25 LAB — POCT GLYCOSYLATED HEMOGLOBIN (HGB A1C): Hemoglobin A1C: 7 % — AB (ref 4.0–5.6)

## 2023-11-25 MED ORDER — TOLTERODINE TARTRATE 1 MG PO TABS: 1.0000 mg | ORAL_TABLET | Freq: Two times a day (BID) | ORAL | 1 refills | Status: DC

## 2023-11-25 MED ORDER — OZEMPIC (0.25 OR 0.5 MG/DOSE) 2 MG/1.5ML ~~LOC~~ SOPN: PEN_INJECTOR | SUBCUTANEOUS | 1 refills | Status: DC

## 2023-11-25 NOTE — Progress Notes (Signed)
 Established Patient Office Visit  Subjective   Patient ID: Mary Nicholson, female    DOB: 03-31-1956  Age: 68 y.o. MRN: [id number]  Chief Complaint  Patient presents with   Medical Management of Chronic Issues    HPI  Delightful 68-yo woman with DMT2, GERD, CKD3a, mixed hyperlipidemia, COPD (still smoking), postlaminectomy syndrome, cervical radiculopathy, peripheral neuropathy, schizophrenia, hypothyroidism, s/p hypothyroidism, peripheral neuropathy.   She is having problems with her neck.  She has had an MRI of her cervical spine and she is scheduled for an EMG September 1.  She states that gabapentin  300 mg 3 times daily helps with her neck pain and her peripheral neuropathy.  She complains of urgency.  She is taking oxybutynin  but does not find it effective.  She is on metformin  1000 mg twice daily and glipizide  XL 10 mg daily.  She has been on glipizide  for 3 or 4 years now.  Reports fasting blood sugars range from the 90s to the 140s.  She takes her glipizide  when she gets up in the morning and she does eat breakfast which can consist of oatmeal, cereal, eggs, toast.  She reports that she has low blood sugars in the mid afternoon say 2 or 3:00 in the afternoon and she will check her blood sugar and it will be in the 60s to 80s.  She has sweats and feels really shaky when this occurs.  She can eat and it will resolve.  She does eat lunch that usually fruit and a sandwich.  She denies low blood sugars overnight.  A1c today is 7.0% down from 7.1% 07/31/2023.    She has not taken her blood pressure medicine today because she has not eaten.  She is on metoprolol  succinate 50 mg daily amlodipine  10 mg daily and hydrochlorothiazide  25 mg daily she also has lisinopril  10 mg daily.  Turns out she does not take her amlodipine  unless she thinks her blood pressure is high because her blood pressure gets too low when she takes all of those.  She can check her blood pressure at home with an above  the elbow cuff but she has not been doing this nor recording these.  She reports the last time she had a colonoscopy she was living in [place]  so expounded been 5 years ago.  She did have polyps and was told to follow-up in 5 years.  Last mammogram was 11/19/2022.  She would be willing to have this repeated.  PHQ-9 score is 13 and GAD-7 score is 14.  She has appointment with psychiatry tomorrow.  She is on Lamictal , BuSpar , resolved he, trazodone .  She sees Garrel Livings in [place].  She has times when her anxiety and depression are too much for her.  She is living with her sister and they do not get along very well and that seems to be the stress of that she is dealing with.  She denies suicidal and homicidal ideation. ROS    Objective:     BP (!) 162/88 (BP Location: Left Arm, Patient Position: Sitting)   Pulse 80   Temp 98.4 F (36.9 C) (Oral)   Resp 18   Ht 5' (1.524 m)   Wt 143 lb (64.9 kg)   SpO2 94%   BMI 27.93 kg/m    Physical Exam Vitals and nursing note reviewed.  Constitutional:      Appearance: Normal appearance.  HENT:     Head: Normocephalic and atraumatic.  Eyes:     Conjunctiva/sclera:  Conjunctivae normal.  Cardiovascular:     Rate and Rhythm: Normal rate and regular rhythm.  Pulmonary:     Effort: Pulmonary effort is normal.     Breath sounds: Normal breath sounds.  Musculoskeletal:     Right lower leg: No edema.     Left lower leg: No edema.  Skin:    General: Skin is warm and dry.  Neurological:     Mental Status: She is alert and oriented to person, place, and time.  Psychiatric:        Mood and Affect: Mood normal.        Behavior: Behavior normal.        Thought Content: Thought content normal.        Judgment: Judgment normal.         No results found for any visits on 11/25/23.    The ASCVD Risk score (Arnett DK, et al., 2019) failed to calculate for the following reasons:   Risk score cannot be calculated because patient has a  medical history suggesting prior/existing ASCVD    Assessment & Plan:  Type 2 diabetes mellitus with diabetic neuropathy, without long-term current use of insulin  (HCC) Assessment & Plan: Having episodes of hypoglycemia in the early afternoons despite eating breakfast and lunch.  She is on Glucotrol  XL 10 mg daily.  Stop Glucotrol  and trial Ozempic  0.25 mg weekly for 4 weeks and follow-up in the office.  Orders: -     POCT glycosylated hemoglobin (Hb A1C) -     Ozempic  (0.25 or 0.5 MG/DOSE); Inject 0.25 mg subq qwk for 4 wk then inject 0.5 mg subq qwk x4 wk, then go to full dose pen qwk  Dispense: 3 mL; Refill: 1 -     Lipid panel -     Comprehensive metabolic panel with GFR -     Microalbumin / creatinine urine ratio  Urgency incontinence -     Tolterodine  Tartrate; Take 1 tablet (1 mg total) by mouth 2 (two) times daily.  Dispense: 60 tablet; Refill: 1  Primary hypertension -     CBC with Differential/Platelet  Vitamin D  deficiency -     VITAMIN D  25 Hydroxy (Vit-D Deficiency, Fractures)  Acquired hypothyroidism Assessment & Plan: TSH was mildly elevated 3 months ago.  Will repeat today   Cervical radiculitis Assessment & Plan: Has got an MRI.  Is doing PT and is scheduled for an EMG study.   Essential hypertension Assessment & Plan: Has not taken her blood pressure medicine today.  Asked her to check her blood pressure at home she does have an above the elbow cuff.  She supposed to be taking metoprolol  succinate 50 mg, amlodipine  10 mg, HCTZ 25 mg and lisinopril  10 mg daily.  Please check your blood pressure at home and take your medication and record.  Will follow-up in a month.   Healthcare maintenance Assessment & Plan: She is due to repeat her colonoscopy and she is due for mammogram.  Both orders were placed.  Orders: -     Ambulatory referral to Gastroenterology -     3D Screening Mammogram, Left and Right; Future  Mixed hyperlipidemia Assessment & Plan: She  is on atorvastatin  80 mg daily.  goal is LDL 70 or less.  Checking her fasting lipid profile.    Chronic inflammatory demyelinating polyneuropathy (HCC) Assessment & Plan: Reports improvement in her symptoms with gabapentin  300 mg 3 times daily   Stage 3b chronic kidney disease (HCC) Assessment &  Plan: Checking CMP today.  She is supposed to be taking lisinopril  10 mg daily but compliance is an issue.      Return in about 4 weeks (around 12/23/2023).    Aymar Whitfill K Derrian Rodak, MD

## 2023-11-25 NOTE — Assessment & Plan Note (Signed)
 TSH was mildly elevated 3 months ago.  Will repeat today

## 2023-11-25 NOTE — Assessment & Plan Note (Signed)
 She is due to repeat her colonoscopy and she is due for mammogram.  Both orders were placed.

## 2023-11-25 NOTE — Assessment & Plan Note (Signed)
 Checking CMP today.  She is supposed to be taking lisinopril  10 mg daily but compliance is an issue.

## 2023-11-25 NOTE — Assessment & Plan Note (Signed)
 Reports improvement in her symptoms with gabapentin  300 mg 3 times daily

## 2023-11-25 NOTE — Assessment & Plan Note (Signed)
 Has got an MRI.  Is doing PT and is scheduled for an EMG study.

## 2023-11-25 NOTE — Assessment & Plan Note (Addendum)
 Has not taken her blood pressure medicine today.  Asked her to check her blood pressure at home she does have an above the elbow cuff.  She supposed to be taking metoprolol  succinate 50 mg, amlodipine  10 mg, HCTZ 25 mg and lisinopril  10 mg daily.  Please check your blood pressure at home and take your medication and record.  Will follow-up in a month.

## 2023-11-25 NOTE — Assessment & Plan Note (Signed)
 She is on atorvastatin  80 mg daily.  goal is LDL 70 or less.  Checking her fasting lipid profile.

## 2023-11-25 NOTE — Assessment & Plan Note (Signed)
 Having episodes of hypoglycemia in the early afternoons despite eating breakfast and lunch.  She is on Glucotrol  XL 10 mg daily.  Stop Glucotrol  and trial Ozempic  0.25 mg weekly for 4 weeks and follow-up in the office.

## 2023-11-26 ENCOUNTER — Ambulatory Visit: Payer: Self-pay | Admitting: Family Medicine

## 2023-11-26 LAB — LIPID PANEL
Chol/HDL Ratio: 2.7 ratio (ref 0.0–4.4)
Cholesterol, Total: 138 mg/dL (ref 100–199)
HDL: 52 mg/dL (ref >39)
LDL Chol Calc (NIH): 56 mg/dL (ref 0–99)
Triglycerides: 181 mg/dL — ABNORMAL HIGH (ref 0–149)
VLDL Cholesterol Cal: 30 mg/dL (ref 5–40)

## 2023-11-26 LAB — COMPREHENSIVE METABOLIC PANEL WITH GFR
ALT: 12 IU/L (ref 0–32)
AST: 15 IU/L (ref 0–40)
Albumin: 4.7 g/dL (ref 3.9–4.9)
Alkaline Phosphatase: 117 IU/L (ref 44–121)
BUN/Creatinine Ratio: 8 — ABNORMAL LOW (ref 12–28)
BUN: 6 mg/dL — ABNORMAL LOW (ref 8–27)
Bilirubin Total: 0.4 mg/dL (ref 0.0–1.2)
CO2: 23 mmol/L (ref 20–29)
Calcium: 9.8 mg/dL (ref 8.7–10.3)
Chloride: 88 mmol/L — ABNORMAL LOW (ref 96–106)
Creatinine, Ser: 0.8 mg/dL (ref 0.57–1.00)
Globulin, Total: 2.6 g/dL (ref 1.5–4.5)
Glucose: 113 mg/dL — ABNORMAL HIGH (ref 70–99)
Potassium: 4.5 mmol/L (ref 3.5–5.2)
Sodium: 128 mmol/L — ABNORMAL LOW (ref 134–144)
Total Protein: 7.3 g/dL (ref 6.0–8.5)
eGFR: 79 mL/min/1.73 (ref >59)

## 2023-11-26 LAB — CBC WITH DIFFERENTIAL/PLATELET
Basophils Absolute: 0 x10E3/uL (ref 0.0–0.2)
Basos: 0 %
EOS (ABSOLUTE): 0 x10E3/uL (ref 0.0–0.4)
Eos: 0 %
Hematocrit: 44.3 % (ref 34.0–46.6)
Hemoglobin: 14.2 g/dL (ref 11.1–15.9)
Immature Grans (Abs): 0.1 x10E3/uL (ref 0.0–0.1)
Immature Granulocytes: 1 %
Lymphocytes Absolute: 1.9 x10E3/uL (ref 0.7–3.1)
Lymphs: 24 %
MCH: 29.3 pg (ref 26.6–33.0)
MCHC: 32.1 g/dL (ref 31.5–35.7)
MCV: 91 fL (ref 79–97)
Monocytes Absolute: 0.6 x10E3/uL (ref 0.1–0.9)
Monocytes: 7 %
Neutrophils Absolute: 5.5 x10E3/uL (ref 1.4–7.0)
Neutrophils: 68 %
Platelets: 386 x10E3/uL (ref 150–450)
RBC: 4.85 x10E6/uL (ref 3.77–5.28)
RDW: 13.5 % (ref 11.7–15.4)
WBC: 8.1 x10E3/uL (ref 3.4–10.8)

## 2023-11-26 LAB — MICROALBUMIN / CREATININE URINE RATIO
Creatinine, Urine: 59.4 mg/dL
Microalb/Creat Ratio: 14 mg/g{creat} (ref 0–29)
Microalbumin, Urine: 8.6 ug/mL

## 2023-11-26 LAB — VITAMIN D 25 HYDROXY (VIT D DEFICIENCY, FRACTURES): Vit D, 25-Hydroxy: 39.5 ng/mL (ref 30.0–100.0)

## 2023-12-03 ENCOUNTER — Other Ambulatory Visit: Payer: Self-pay | Admitting: Family Medicine

## 2023-12-03 DIAGNOSIS — E119 Type 2 diabetes mellitus without complications: Secondary | ICD-10-CM

## 2023-12-06 NOTE — Telephone Encounter (Signed)
 Patient no longer under prescriber care.  Requested Prescriptions  Pending Prescriptions Disp Refills   amLODipine (NORVASC) 10 MG tablet [Pharmacy Med Name: AMLODIPINE BESYLATE 10 MG TAB] 90 tablet 1    Sig: TAKE 1 TABLET BY MOUTH EVERY DAY     Cardiovascular: Calcium Channel Blockers 2 Failed - 12/06/2023  8:20 AM      Failed - Last BP in normal range    BP Readings from Last 1 Encounters:  11/25/23 (!) 162/88         Failed - Valid encounter within last 6 months    Recent Outpatient Visits   None     Future Appointments             In 2 weeks Ziglar, Susan K, MD [facility] - Last Heart Rate in normal range    Pulse Readings from Last 1 Encounters:  11/25/23 80

## 2023-12-10 ENCOUNTER — Other Ambulatory Visit: Payer: Self-pay | Admitting: Family Medicine

## 2023-12-10 DIAGNOSIS — E119 Type 2 diabetes mellitus without complications: Secondary | ICD-10-CM

## 2023-12-11 NOTE — Telephone Encounter (Signed)
 Requested Prescriptions  Pending Prescriptions Disp Refills   amLODipine  (NORVASC ) 10 MG tablet [Pharmacy Med Name: AMLODIPINE  BESYLATE 10 MG TAB] 30 tablet 0    Sig: TAKE 1 TABLET BY MOUTH EVERY DAY     Cardiovascular: Calcium  Channel Blockers 2 Failed - 12/11/2023  2:49 PM      Failed - Last BP in normal range    BP Readings from Last 1 Encounters:  11/25/23 (!) 162/88         Failed - Valid encounter within last 6 months    Recent Outpatient Visits   None     Future Appointments             In 2 weeks Ziglar, Susan K, MD [facility] - Last Heart Rate in normal range    Pulse Readings from Last 1 Encounters:  11/25/23 80

## 2023-12-26 ENCOUNTER — Ambulatory Visit (INDEPENDENT_AMBULATORY_CARE_PROVIDER_SITE_OTHER): Admitting: Family Medicine

## 2023-12-26 VITALS — BP 129/79 | HR 87 | Temp 98.0°F | Resp 18 | Ht 60.0 in | Wt 135.0 lb

## 2023-12-26 DIAGNOSIS — E039 Hypothyroidism, unspecified: Secondary | ICD-10-CM

## 2023-12-26 DIAGNOSIS — R110 Nausea: Secondary | ICD-10-CM | POA: Diagnosis not present

## 2023-12-26 DIAGNOSIS — F17210 Nicotine dependence, cigarettes, uncomplicated: Secondary | ICD-10-CM

## 2023-12-26 DIAGNOSIS — E1140 Type 2 diabetes mellitus with diabetic neuropathy, unspecified: Secondary | ICD-10-CM

## 2023-12-26 DIAGNOSIS — Z7984 Long term (current) use of oral hypoglycemic drugs: Secondary | ICD-10-CM | POA: Diagnosis not present

## 2023-12-26 DIAGNOSIS — F17200 Nicotine dependence, unspecified, uncomplicated: Secondary | ICD-10-CM | POA: Insufficient documentation

## 2023-12-26 MED ORDER — ONETOUCH DELICA PLUS LANCET33G MISC: 1.0000 | Freq: Three times a day (TID) | 3 refills | Status: AC

## 2023-12-26 MED ORDER — OZEMPIC (0.25 OR 0.5 MG/DOSE) 2 MG/3ML ~~LOC~~ SOPN: 0.5000 mg | PEN_INJECTOR | SUBCUTANEOUS | 0 refills | Status: DC

## 2023-12-26 MED ORDER — ONETOUCH ULTRA VI STRP: 1.0000 | ORAL_STRIP | Freq: Three times a day (TID) | 3 refills | Status: AC

## 2023-12-26 MED ORDER — ONETOUCH VERIO FLEX SYSTEM DEVI: 1.0000 | Freq: Three times a day (TID) | 3 refills | Status: AC

## 2023-12-26 MED ORDER — ONDANSETRON HCL 4 MG PO TABS: 4.0000 mg | ORAL_TABLET | Freq: Three times a day (TID) | ORAL | 1 refills | Status: AC | PRN

## 2023-12-26 NOTE — Assessment & Plan Note (Signed)
 Was having low blood sugars in the mid afternoon.  Asked her to stop her Glucotrol .  She is smoking is on Actos  15 mg daily.  Ask her to stop Actos .  That leaves metformin  1000 mg twice daily and Ozempic .  Has nausea for the first 2 days after the shot and has lost 8 pounds.  Reports fasting blood sugar this morning was 141.

## 2023-12-26 NOTE — Progress Notes (Signed)
 Established Patient Office Visit  Subjective   Patient ID: Mary Nicholson, female    DOB: 26-May-1955  Age: 68 y.o. MRN: [id number]  Chief Complaint  Patient presents with   Medical Management of Chronic Issues    fasting    HPI Delightful 68-yo woman with DMT2, GERD, CKD3a, mixed hyperlipidemia, COPD (still smoking), postlaminectomy syndrome, cervical radiculopathy, peripheral neuropathy, schizophrenia, hypothyroidism, s/p hypothyroidism  Discussed the use of AI scribe software for clinical note transcription with the patient, who gave verbal consent to proceed.  History of Present Illness   Mary Nicholson is a 68 year old female with diabetes and hypothyroidism who presents for follow-up and medication management.  She is currently taking Ozempic  0.5 mg weekly for diabetes management, having transitioned from Glucotrol  and discontinued Actos . She experiences nausea and pain similar to acid reflux after taking Ozempic , leading to vomiting. Her blood sugar levels are well-managed, with a morning reading of 140 mg/dL and a midday reading of 84 mg/dL. She has lost eight pounds since starting Ozempic  and is mindful of her food intake. No current issues with low blood sugar in the afternoon, as she manages it with healthy snacks.  She has a long-standing history of hypothyroidism, managed with Synthroid  100 mcg daily.  She has a history of low sodium levels, with a recent lab result showing a sodium level of 128 mmol/L.  She has bladder issues and has recently changed her medication, which has helped, but she still experiences nocturia and urgency. She drinks a lot of water, especially before bed, which may contribute to her symptoms.  She is actively trying to reduce her cigarette consumption, currently smoking less than half of what she used to during the day. She engages in physical activities such as cutting grass with a push mower and doing yard work.  She reports a  sore neck and is scheduled for an EMG on September 11. She takes gabapentin  for pain relief, which helps until her arthritis flares up. She also takes arthritis medication for ankle pain.  She has not had a lung cancer screening test in the last two years and is due for one. She has not heard back about her colonoscopy appointment. No issues with her blood pressure medication, and her blood pressure was good at the visit.   Has seen her psychiatrist and there were no change in her medications I feel good.       ROS    Objective:     BP 129/79 (BP Location: Left Arm, Patient Position: Sitting, Cuff Size: Normal)   Pulse 87   Temp 98 F (36.7 C) (Oral)   Resp 18   Ht 5' (1.524 m)   Wt 135 lb (61.2 kg)   SpO2 97%   BMI 26.37 kg/m    Physical Exam Vitals and nursing note reviewed.  Constitutional:      Appearance: Normal appearance.  HENT:     Head: Normocephalic and atraumatic.  Eyes:     Conjunctiva/sclera: Conjunctivae normal.  Cardiovascular:     Rate and Rhythm: Normal rate and regular rhythm.     Pulses:          Dorsalis pedis pulses are 2+ on the right side and 2+ on the left side.  Pulmonary:     Effort: Pulmonary effort is normal.     Breath sounds: Normal breath sounds.  Musculoskeletal:     Right lower leg: No edema.     Left lower leg: No  edema.  Feet:     Right foot:     Protective Sensation: 5 sites tested.  5 sites sensed.     Skin integrity: Skin integrity normal.     Toenail Condition: Right toenails are abnormally thick. Fungal disease present.    Left foot:     Protective Sensation: 5 sites tested.  5 sites sensed.     Skin integrity: Skin integrity normal.     Toenail Condition: Left toenails are abnormally thick. Fungal disease present. Skin:    General: Skin is warm and dry.  Neurological:     Mental Status: She is alert and oriented to person, place, and time.  Psychiatric:        Mood and Affect: Mood normal.        Behavior: Behavior  normal.        Thought Content: Thought content normal.        Judgment: Judgment normal.          No results found for any visits on 12/26/23.    The ASCVD Risk score (Arnett DK, et al., 2019) failed to calculate for the following reasons:   Risk score cannot be calculated because patient has a medical history suggesting prior/existing ASCVD    Assessment & Plan:  Type 2 diabetes mellitus with diabetic neuropathy, without long-term current use of insulin  [facility]) Assessment & Plan: Was having low blood sugars in the mid afternoon.  Asked her to stop her Glucotrol .  She is smoking is on Actos  15 mg daily.  Ask her to stop Actos .  That leaves metformin  1000 mg twice daily and Ozempic .  Has nausea for the first 2 days after the shot and has lost 8 pounds.  Reports fasting blood sugar this morning was 141.  Orders: -     OneTouch Ultra; 1 each by Other route 3 (three) times daily.  Dispense: 270 each; Refill: 3 -     OneTouch Delica Plus Lancet33G; 1 each by Does not apply route in the morning, at noon, and at bedtime.  Dispense: 270 each; Refill: 3 -     OneTouch Verio Flex System; 1 each by Does not apply route in the morning, at noon, and at bedtime.  Dispense: 1 each; Refill: 3  Nausea  Cigarette nicotine dependence without complication Assessment & Plan: She agrees to a lung cancer screening test      Return in about 4 weeks (around 01/23/2024).    Cecile Guevara K Edu On, MD

## 2023-12-26 NOTE — Assessment & Plan Note (Signed)
 She agrees to a lung cancer screening test

## 2023-12-26 NOTE — Assessment & Plan Note (Signed)
 11/25/2023 TSH 5.980, FT4 0.98.  She is on levothyroxine  100 mcg daily.  1 day a week increase this to 2 tablets and we will recheck in 3 months.

## 2024-01-02 ENCOUNTER — Ambulatory Visit: Admitting: Neurology

## 2024-01-02 DIAGNOSIS — R202 Paresthesia of skin: Secondary | ICD-10-CM | POA: Diagnosis not present

## 2024-01-02 DIAGNOSIS — G5602 Carpal tunnel syndrome, left upper limb: Secondary | ICD-10-CM

## 2024-01-02 NOTE — Procedures (Signed)
  [facility]  [address] [place], [address]  [place], [place] [address] Tel: [phone] Fax: [phone] Test Date:  01/02/2024  Patient: Mary Nicholson DOB: Oct 01, 1955 Physician: Tonita Blanch, DO  Sex: Female Height: 5' 0 Ref Phys: Lyle Decamp, DEVONNA  ID#: [id number]   Technician:    History: This is a 68 year old female referred for evaluation of left upper extremity paresthesias.  NCV & EMG Findings: Extensive electrodiagnostic testing of the left upper extremity shows:  Left mixed palmar sensory responses show prolonged latency.  Left median and ulnar motor responses are within normal limits.  Of note, there is evidence of a left Martin-Gruber anastomosis, a normal anatomic variant. Left median and ulnar motor responses are within normal limits. There is no evidence of active or chronic motor axonal loss changes affecting any of the tested muscles.  Motor unit configuration and recruitment pattern is within normal limits.  Impression: Left median neuropathy at or distal to the wrist, consistent with a clinical diagnosis of carpal tunnel syndrome.  Overall, these findings are very mild in degree electrically.   ___________________________ Tonita Blanch, DO    Nerve Conduction Studies   Stim Site NR Peak (ms) Norm Peak (ms) O-P Amp (V) Norm O-P Amp  Left Median Anti Sensory (2nd Digit)  32 C  Wrist    3.4 <3.8 16.1 >10  Left Ulnar Anti Sensory (5th Digit)  32 C  Wrist    2.3 <3.2 19.2 >5     Stim Site NR Onset (ms) Norm Onset (ms) O-P Amp (mV) Norm O-P Amp Site1 Site2 Delta-0 (ms) Dist (cm) Vel (m/s) Norm Vel (m/s)  Left Median Motor (Abd Poll Brev)  32 C  Wrist    3.6 <4.0 5.4 >5 Ulnar-wrist crossover Wrist 5.1 32.0 63 >50  Ulnar-wrist crossover    8.7  5.1  Axilla Ulnar-wrist crossover 4.6 0.0    Axilla    4.1  5.1         Left Ulnar Motor (Abd Dig Minimi)  32 C  Wrist    2.6 <3.1 8.4 >7 B Elbow Wrist 4.0 22.0 55 >50  B Elbow    6.6  7.2  A  Elbow B Elbow 1.9 10.0 53 >50  A Elbow    8.5  6.4            Stim Site NR Peak (ms) Norm Peak (ms) P-T Amp (V) Site1 Site2 Delta-P (ms) Norm Delta (ms)  Left Median/Ulnar Palm Comparison (Wrist - 8cm)  32 C  Median Palm    1.9 <2.2 72.4 Median Palm Ulnar Palm *0.6   Ulnar Palm    1.3 <2.2 15.8       Electromyography   Side Muscle Ins.Act Fibs Fasc Recrt Amp Dur Poly Activation Comment  Left 1stDorInt Nml Nml Nml Nml Nml Nml Nml Nml N/A  Left Abd Poll Brev Nml Nml Nml Nml Nml Nml Nml Nml N/A  Left PronatorTeres Nml Nml Nml Nml Nml Nml Nml Nml N/A  Left Biceps Nml Nml Nml Nml Nml Nml Nml Nml N/A  Left Triceps Nml Nml Nml Nml Nml Nml Nml Nml N/A  Left Deltoid Nml Nml Nml Nml Nml Nml Nml Nml N/A      Waveforms:

## 2024-01-06 ENCOUNTER — Ambulatory Visit (INDEPENDENT_AMBULATORY_CARE_PROVIDER_SITE_OTHER): Admitting: Physician Assistant

## 2024-01-06 DIAGNOSIS — M549 Dorsalgia, unspecified: Secondary | ICD-10-CM

## 2024-01-06 DIAGNOSIS — M50321 Other cervical disc degeneration at C4-C5 level: Secondary | ICD-10-CM

## 2024-01-06 DIAGNOSIS — G5602 Carpal tunnel syndrome, left upper limb: Secondary | ICD-10-CM | POA: Diagnosis not present

## 2024-01-06 DIAGNOSIS — M542 Cervicalgia: Secondary | ICD-10-CM
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
## 2024-01-21 ENCOUNTER — Other Ambulatory Visit: Payer: Self-pay | Admitting: Family Medicine

## 2024-01-21 DIAGNOSIS — N3941 Urge incontinence: Secondary | ICD-10-CM

## 2024-01-22 DIAGNOSIS — M5030 Other cervical disc degeneration, unspecified cervical region: Secondary | ICD-10-CM | POA: Diagnosis not present

## 2024-01-22 DIAGNOSIS — M7918 Myalgia, other site: Secondary | ICD-10-CM | POA: Diagnosis not present

## 2024-01-24 ENCOUNTER — Ambulatory Visit (INDEPENDENT_AMBULATORY_CARE_PROVIDER_SITE_OTHER): Admitting: Family Medicine

## 2024-01-24 ENCOUNTER — Encounter: Payer: Self-pay | Admitting: Family Medicine

## 2024-01-24 VITALS — BP 134/75 | HR 84 | Temp 97.5°F | Resp 18 | Ht 60.0 in | Wt 137.0 lb

## 2024-01-24 DIAGNOSIS — R7989 Other specified abnormal findings of blood chemistry: Secondary | ICD-10-CM

## 2024-01-24 DIAGNOSIS — E039 Hypothyroidism, unspecified: Secondary | ICD-10-CM | POA: Diagnosis not present

## 2024-01-24 DIAGNOSIS — E1140 Type 2 diabetes mellitus with diabetic neuropathy, unspecified: Secondary | ICD-10-CM | POA: Diagnosis not present

## 2024-01-24 DIAGNOSIS — E871 Hypo-osmolality and hyponatremia: Secondary | ICD-10-CM | POA: Diagnosis not present

## 2024-01-24 DIAGNOSIS — Z23 Encounter for immunization: Secondary | ICD-10-CM | POA: Diagnosis not present

## 2024-01-24 MED ORDER — OZEMPIC (0.25 OR 0.5 MG/DOSE) 2 MG/3ML ~~LOC~~ SOPN: 0.5000 mg | PEN_INJECTOR | SUBCUTANEOUS | 2 refills | Status: DC

## 2024-01-24 NOTE — Assessment & Plan Note (Signed)
 FBS was 112 today.  Denies any low BS episodes like dizziness, HA , confusion and diaphoresis.  Stay on Ozempic  0.5mg  and metformin  1000 mg BID.  Symptoms for Ozempic  are minimal.

## 2024-01-24 NOTE — Assessment & Plan Note (Signed)
 TSH was mildly elevated.  Will check again today.  May need her levothyroxine  dosage changed.
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
## 2024-01-24 NOTE — Assessment & Plan Note (Signed)
 She takes lisinopril  10 mg daily and venlafaxine , Rexulti and hydrochlorothiazide  all of which hyponatremia may affect her sodium levels.  Will check sodium today.

## 2024-01-25 LAB — CMP14+EGFR
ALT: 17 IU/L (ref 0–32)
AST: 22 IU/L (ref 0–40)
Albumin: 4.5 g/dL (ref 3.9–4.9)
Alkaline Phosphatase: 110 IU/L (ref 49–135)
BUN/Creatinine Ratio: 6 — ABNORMAL LOW (ref 12–28)
BUN: 4 mg/dL — ABNORMAL LOW (ref 8–27)
Bilirubin Total: 0.4 mg/dL (ref 0.0–1.2)
CO2: 23 mmol/L (ref 20–29)
Calcium: 9.9 mg/dL (ref 8.7–10.3)
Chloride: 92 mmol/L — ABNORMAL LOW (ref 96–106)
Creatinine, Ser: 0.63 mg/dL (ref 0.57–1.00)
Globulin, Total: 2.5 g/dL (ref 1.5–4.5)
Glucose: 104 mg/dL — ABNORMAL HIGH (ref 70–99)
Potassium: 4.2 mmol/L (ref 3.5–5.2)
Sodium: 130 mmol/L — ABNORMAL LOW (ref 134–144)
Total Protein: 7 g/dL (ref 6.0–8.5)
eGFR: 95 mL/min/1.73 (ref >59)

## 2024-01-25 LAB — TSH+FREE T4
Free T4: 1.34 ng/dL (ref 0.82–1.77)
TSH: 2.34 u[IU]/mL (ref 0.450–4.500)

## 2024-01-28 ENCOUNTER — Ambulatory Visit: Payer: Self-pay | Admitting: Family Medicine

## 2024-01-29 ENCOUNTER — Other Ambulatory Visit: Payer: Self-pay | Admitting: Family Medicine

## 2024-01-29 DIAGNOSIS — K219 Gastro-esophageal reflux disease without esophagitis: Secondary | ICD-10-CM

## 2024-01-29 DIAGNOSIS — R1319 Other dysphagia: Secondary | ICD-10-CM

## 2024-01-31 NOTE — Telephone Encounter (Signed)
 Unable to refill per protocol, provider not at this practice.  Requested Prescriptions  Pending Prescriptions Disp Refills   hydrochlorothiazide  (HYDRODIURIL ) 25 MG tablet [Pharmacy Med Name: HYDROCHLOROTHIAZIDE  25 MG TAB] 90 tablet 1    Sig: TAKE 1 TABLET (25 MG TOTAL) BY MOUTH DAILY.     Cardiovascular: Diuretics - Thiazide Failed - 01/31/2024 11:16 AM      Failed - Na in normal range and within 180 days    Sodium  Date Value Ref Range Status  01/24/2024 130 (L) 134 - 144 mmol/L Final         Failed - Valid encounter within last 6 months    Recent Outpatient Visits   None            Passed - Cr in normal range and within 180 days    Creatinine, Ser  Date Value Ref Range Status  01/24/2024 0.63 0.57 - 1.00 mg/dL Final   Creatinine, POC  Date Value Ref Range Status  06/10/2023 50 mg/dL Final         Passed - K in normal range and within 180 days    Potassium  Date Value Ref Range Status  01/24/2024 4.2 3.5 - 5.2 mmol/L Final         Passed - Last BP in normal range    BP Readings from Last 1 Encounters:  01/24/24 134/75          famotidine  (PEPCID ) 40 MG tablet [Pharmacy Med Name: FAMOTIDINE  40 MG TABLET] 90 tablet 1    Sig: TAKE 1 TABLET BY MOUTH EVERY DAY     Gastroenterology:  H2 Antagonists Failed - 01/31/2024 11:16 AM      Failed - Valid encounter within last 12 months    Recent Outpatient Visits   None

## 2024-02-02 ENCOUNTER — Other Ambulatory Visit: Payer: Self-pay | Admitting: Family Medicine

## 2024-02-02 DIAGNOSIS — E119 Type 2 diabetes mellitus without complications: Secondary | ICD-10-CM

## 2024-02-02 DIAGNOSIS — R1319 Other dysphagia: Secondary | ICD-10-CM

## 2024-02-02 DIAGNOSIS — K219 Gastro-esophageal reflux disease without esophagitis: Secondary | ICD-10-CM

## 2024-02-02 DIAGNOSIS — E039 Hypothyroidism, unspecified: Secondary | ICD-10-CM

## 2024-02-04 NOTE — Telephone Encounter (Signed)
 No longer under prescriber care. Requested Prescriptions  Refused Prescriptions Disp Refills   famotidine  (PEPCID ) 40 MG tablet [Pharmacy Med Name: FAMOTIDINE  40 MG TABLET] 30 tablet 0    Sig: TAKE 1 TABLET BY MOUTH EVERY DAY     Gastroenterology:  H2 Antagonists Failed - 02/04/2024  2:24 PM      Failed - Valid encounter within last 12 months    Recent Outpatient Visits   None             hydrochlorothiazide  (HYDRODIURIL ) 25 MG tablet [Pharmacy Med Name: HYDROCHLOROTHIAZIDE  25 MG TAB] 30 tablet 0    Sig: TAKE 1 TABLET (25 MG TOTAL) BY MOUTH DAILY.     Cardiovascular: Diuretics - Thiazide Failed - 02/04/2024  2:24 PM      Failed - Na in normal range and within 180 days    Sodium  Date Value Ref Range Status  01/24/2024 130 (L) 134 - 144 mmol/L Final         Failed - Valid encounter within last 6 months    Recent Outpatient Visits   None            Passed - Cr in normal range and within 180 days    Creatinine, Ser  Date Value Ref Range Status  01/24/2024 0.63 0.57 - 1.00 mg/dL Final   Creatinine, POC  Date Value Ref Range Status  06/10/2023 50 mg/dL Final         Passed - K in normal range and within 180 days    Potassium  Date Value Ref Range Status  01/24/2024 4.2 3.5 - 5.2 mmol/L Final         Passed - Last BP in normal range    BP Readings from Last 1 Encounters:  01/24/24 134/75          levothyroxine  (SYNTHROID ) 100 MCG tablet [Pharmacy Med Name: LEVOTHYROXINE  100 MCG TABLET] 30 tablet 0    Sig: TAKE 1 TABLET BY MOUTH EVERY DAY     Endocrinology:  Hypothyroid Agents Failed - 02/04/2024  2:24 PM      Failed - Valid encounter within last 12 months    Recent Outpatient Visits   None            Passed - TSH in normal range and within 360 days    TSH  Date Value Ref Range Status  01/24/2024 2.340 0.450 - 4.500 uIU/mL Final          pioglitazone  (ACTOS ) 15 MG tablet [Pharmacy Med Name: PIOGLITAZONE  HCL 15 MG TABLET] 30 tablet 0    Sig: TAKE  1 TABLET (15 MG TOTAL) BY MOUTH DAILY.     Endocrinology:  Diabetes - Glitazones - pioglitazone  Failed - 02/04/2024  2:24 PM      Failed - Valid encounter within last 6 months    Recent Outpatient Visits   None            Passed - HBA1C is between 0 and 7.9 and within 180 days    Hemoglobin A1C  Date Value Ref Range Status  11/25/2023 7.0 (A) 4.0 - 5.6 % Final  03/24/2020 7.4  Final   Hgb A1c MFr Bld  Date Value Ref Range Status  04/19/2023 6.9 (H) 4.8 - 5.6 % Final    Comment:             Prediabetes: 5.7 - 6.4          Diabetes: >6.4  Glycemic control for adults with diabetes: <7.0

## 2024-02-13 ENCOUNTER — Other Ambulatory Visit: Payer: Self-pay | Admitting: Family Medicine

## 2024-02-13 DIAGNOSIS — E119 Type 2 diabetes mellitus without complications: Secondary | ICD-10-CM

## 2024-02-19 ENCOUNTER — Telehealth: Payer: Self-pay | Admitting: Family Medicine

## 2024-02-19 NOTE — Telephone Encounter (Signed)
 Copied from CRM [id number]. Topic: Referral - Request for Referral >> Feb 18, 2024  2:59 PM Dedra B wrote: Did the patient discuss referral with their provider in the last year? Yes, mammogram previously ordered but pt says she needs a referral   Appointment offered? No, since previously discussed  Type of order/referral and detailed reason for visit: Pt said she needs mammogram for R breast tenderness  Preference of office, provider, location: [place]  If referral order, have you been seen by this specialty before? Yes, 10/2022 [facility] [place]    Can we respond through MyChart? No, pls call home phone [phone]    11/25/23 referral for test placed, needs to call [place] [facility] at [phone]. The order is in there and the scheduling dept was unable to reach  her to schedule per the notes

## 2024-02-21 DIAGNOSIS — H35373 Puckering of macula, bilateral: Secondary | ICD-10-CM | POA: Diagnosis not present

## 2024-02-21 DIAGNOSIS — E119 Type 2 diabetes mellitus without complications: Secondary | ICD-10-CM | POA: Diagnosis not present

## 2024-02-21 DIAGNOSIS — Z961 Presence of intraocular lens: Secondary | ICD-10-CM | POA: Diagnosis not present

## 2024-02-21 DIAGNOSIS — H43813 Vitreous degeneration, bilateral: Secondary | ICD-10-CM | POA: Diagnosis not present

## 2024-03-14 ENCOUNTER — Other Ambulatory Visit: Payer: Self-pay | Admitting: Family Medicine

## 2024-03-30 ENCOUNTER — Telehealth: Payer: Self-pay | Admitting: Pharmacist

## 2024-03-30 ENCOUNTER — Other Ambulatory Visit: Payer: Self-pay | Admitting: Pharmacist

## 2024-03-30 DIAGNOSIS — I10 Essential (primary) hypertension: Secondary | ICD-10-CM

## 2024-03-30 NOTE — Progress Notes (Unsigned)
 03/30/2024  Patient ID: Mary Nicholson, female   DOB: [birth year], 68 y.o.   MRN: [id number]  This patient is appearing on a report for the adherence measure for hypertension (ACEi/ARB) medications this calendar year.   Medication: lisinopril  10 mg Last fill date:11/07/2023 for 90 day supply  Outreach to patient today by telephone   Hypertension:  Current antihypertensives:  - metoprolol  ER 50 mg daily - Reports ran out of HCTZ 25 mg ~middle of October - Reports ran out of lisinopril  10 mg ~ middle of October  Reports also previously taking amlodipine  10 mg daily, but ran out ~August and stopped taking as no refills remaining and it was not renewed  States did not contact office when ran out of amlodipine , lisinopril  or HCTZ because she assumed that if it was not renewed, then she no longer needed to take these. - From review of chart, note patient switched PCP providers from Dr. Joshua to Dr. Ziglar in February 2025  Patient has a home BP machine. Reports recent home blood pressure readings:  - Today: 122/66, HR 84 - Last night 154/77, HR 85 - 12/6: 146/73, HR 97  Denies adding salt to her food. Admits to eating some packaged food high in sodium, but has been cutting back  Caffeine intake: Reports currently drinking 2-3 x 10 oz cups of coffee/day (reports has cut back from previously drinking 4-5 cups/day   Tobacco Abuse:  Currently smokes 10 or more cigarettes/day (Reports has cut down from 1 pack/day)  Reports smokes within ~15 minutes of waking  Barriers to quitting: feels edgy with cutting back  Reports previously noticed benefit with use of nicotine patches when admitted to the hospital. Has also tried nicotine lozenges in the past, but did not like the taste   Objective  BP Readings from Last 3 Encounters:  01/24/24 134/75  12/26/23 129/79  11/25/23 (!) 162/88   Pulse Readings from Last 3 Encounters:  01/24/24 84  12/26/23 87  11/25/23 80      Current Outpatient Medications on File Prior to Visit  Medication Sig Dispense Refill   Acetaminophen  (TYLENOL  ARTHRITIS EXT RELIEF PO) Take 1 capsule by mouth 3 (three) times daily.     albuterol  (VENTOLIN  HFA) 108 (90 Base) MCG/ACT inhaler INHALE 1-2 PUFFS BY MOUTH EVERY 6 HOURS AS NEEDED FOR WHEEZE OR SHORTNESS OF BREATH 3 each 0   amLODipine  (NORVASC ) 10 MG tablet Take 1 tablet (10 mg total) by mouth daily. 30 tablet 0   aspirin 81 MG chewable tablet Chew by mouth.     atorvastatin  (LIPITOR) 80 MG tablet TAKE 1 TABLET BY MOUTH EVERY DAY 90 tablet 1   b complex vitamins capsule Take 1 capsule by mouth daily.     Biotin 5 MG CAPS Take 1 capsule by mouth daily.     Blood Glucose Monitoring Suppl (ONETOUCH VERIO FLEX SYSTEM) DEVI 1 each by Does not apply route in the morning, at noon, and at bedtime. 1 each 3   busPIRone  (BUSPAR ) 15 MG tablet Take 1 tablet (15 mg total) by mouth 3 (three) times daily. 45 tablet 0   carboxymethylcellulose (REFRESH PLUS) 0.5 % SOLN Place 1 drop into both eyes daily. 1 drop in left eye BiD and 1 drop to right eye daily     famotidine  (PEPCID ) 40 MG tablet Take 1 tablet (40 mg total) by mouth daily. 30 tablet 0   fluticasone  (FLONASE ) 50 MCG/ACT nasal spray SPRAY 2 SPRAYS INTO EACH NOSTRIL EVERY  DAY 48 mL 0   Fluticasone -Umeclidin-Vilant (TRELEGY ELLIPTA ) 100-62.5-25 MCG/ACT AEPB INHALE 1 PUFF BY MOUTH EVERY DAY 60 each 0   gabapentin  (NEURONTIN ) 300 MG capsule Take 1 capsule (300 mg total) by mouth 3 (three) times daily. 270 capsule 1   hydrochlorothiazide  (HYDRODIURIL ) 25 MG tablet TAKE 1 TABLET (25 MG TOTAL) BY MOUTH DAILY. 90 tablet 0   hydrOXYzine  (ATARAX ) 25 MG tablet Take 25 mg by mouth 2 (two) times daily.     lamoTRIgine  (LAMICTAL ) 100 MG tablet Take 1 tablet (100 mg total) by mouth 2 (two) times daily. cbc 60 tablet 0   Lancets (ONETOUCH DELICA PLUS LANCET33G) MISC 1 each by Does not apply route in the morning, at noon, and at bedtime. 270 each 3    levothyroxine  (SYNTHROID ) 100 MCG tablet Take 1 tablet (100 mcg total) by mouth daily. 30 tablet 0   lidocaine  (LIDODERM ) 5 % Place 1 patch onto the skin every 12 (twelve) hours. Remove & Discard patch within 12 hours or as directed by MD 10 patch 0   lisinopril  (ZESTRIL ) 10 MG tablet Take 10 mg by mouth daily.     metFORMIN  (GLUCOPHAGE ) 1000 MG tablet Take 1 tablet (1,000 mg total) by mouth 2 (two) times daily with a meal. 60 tablet 0   metoprolol  succinate (TOPROL -XL) 50 MG 24 hr tablet TAKE 1 TABLET BY MOUTH EVERY DAY 90 tablet 1   Multiple Vitamins-Minerals (CENTRUM SILVER PO) Take 1 each by mouth daily.     mupirocin  ointment (BACTROBAN ) 2 % APPLY TO AFFECTED AREA TWICE A DAY 22 g 0   neomycin-polymyxin b-dexamethasone  (MAXITROL) 3.5-10000-0.1 SUSP      ondansetron  (ZOFRAN ) 4 MG tablet Take 1 tablet (4 mg total) by mouth every 8 (eight) hours as needed for nausea or vomiting. 20 tablet 1   OneTouch Delica Lancets 33G MISC USE 1 EACH ONCE DAILY USE AS INSTRUCTED. 100 each 11   ONETOUCH ULTRA test strip 1 each by Other route 3 (three) times daily. 270 each 3   oxyCODONE  (ROXICODONE ) 5 MG immediate release tablet Take 1 tablet (5 mg total) by mouth every 6 (six) hours as needed. 12 tablet 0   REXULTI 0.5 MG TABS Take 1 tablet by mouth daily.     Semaglutide ,0.25 or 0.5MG /DOS, (OZEMPIC , 0.25 OR 0.5 MG/DOSE,) 2 MG/3ML SOPN Inject 0.5 mg into the skin once a week. 3 mL 2   SODIUM FLUORIDE 5000 PPM 1.1 % PSTE See admin instructions.     tiZANidine  (ZANAFLEX ) 4 MG tablet Take 1 tablet (4 mg total) by mouth 3 (three) times daily. 270 tablet 0   tolterodine  (DETROL ) 1 MG tablet Take 1 tablet (1 mg total) by mouth 2 (two) times daily. 60 tablet 1   traZODone  (DESYREL ) 50 MG tablet Take 1 tablet (50 mg total) by mouth at bedtime as needed. 30 tablet 0   venlafaxine  XR (EFFEXOR -XR) 75 MG 24 hr capsule Take 2 capsules (150 mg total) by mouth in the morning and at bedtime. 60 capsule 0   No current  facility-administered medications on file prior to visit.       Assessment/Plan:  Patient denies need to complete comprehensive medication review with pharmacist  Hypertension - Counseled on long term microvascular and macrovascular complications of uncontrolled hypertension - Discussed dietary modifications, such as reduced salt/sodium intake - Recommend to monitor home blood pressure, keep log of results and have this record to review at upcoming medical appointments. Patient to contact provider office sooner if  needed for readings outside of established parameters or symptoms - Will collaborate with PCP regarding patient's recent home blood pressure readings/hypertension medication management  Tobacco Abuse - Assess tobacco use and discuss strategies for reduction - Counseled on use of nicotine patches, including proper placement and potential side effects, including mild itching/redness at the location site, headache, trouble sleeping and/or vivid dreams. Advised to remove patch at night if development of trouble sleeping.  - Patch Schedule for >10 cigarettes daily: Apply one 21 mg patch daily for 6 weeks. Then, reduce to one 14 mg patch daily for another 2 weeks, if able. Then, reduce to one 7 mg patch for another 2 weeks, if able.  - Counsel on using nicotine gum 4 mg as needed. Counseled on chew and park method and potential side effects, including nausea, hiccups, cough, and heartburn. Advised to minimize the amount of nicotine swallowed to reduce incidence of GI side effects - Discuss with patient using health plan OTC savings card for obtaining nicotine replacement therapy  Sharyle Sia, PharmD, [facility] [phone]

## 2024-03-30 NOTE — Progress Notes (Unsigned)
 Dr. Ziglar,   Reached out to this patient today as she was appearing on insurance quality - medication adherence list for her lisinopril  10 mg daily (last filled 7/17 for 90 day supply).   I see that patient established with you early this year.   Today she shared that in terms of antihypertensives, she is currently only taking metoprolol  ER 50 mg daily. Previously she was taking HCTZ (ran out mid-October - did not yet pick up refill), lisinopril  (ran out mid-October - no refills left) and amlodipine  (ran out mid- August - no refills left). States did not contact office when ran out of amlodipine , lisinopril  or HCTZ because she assumed that if it was not renewed, then she no longer needed to take these.  Reports has been working on reducing salt/sodium in diet, has reduced caffeine intake and cut back on tobacco use (discussed further today)  Shared recent home BP readings: - Today: 122/66, HR 84 - Last night 154/77, HR 85 - 12/6: 146/73, HR 97  Patient's next appointment with you scheduled for 04/27/2024. At this time, would you please consider having office contact patient about being seen sooner?  Thank you!  Sharyle Sia, PharmD, [facility] [phone]

## 2024-03-31 NOTE — Telephone Encounter (Signed)
 Asked Zimmerman Rumple to contact patient and have her come in for an urgent appointment to get her BP medications filled.

## 2024-04-01 NOTE — Patient Instructions (Signed)
Check your blood pressure once daily, and any time you have concerning symptoms like headache, chest pain, dizziness, shortness of breath, or vision changes.   Our goal is less than 130/80.  To appropriately check your blood pressure, make sure you do the following:  1) Avoid caffeine, exercise, or tobacco products for 30 minutes before checking. Empty your bladder. 2) Sit with your back supported in a flat-backed chair. Rest your arm on something flat (arm of the chair, table, etc). 3) Sit still with your feet flat on the floor, resting, for at least 5 minutes.  4) Check your blood pressure. Take 1-2 readings.  5) Write down these readings and bring with you to any provider appointments.  Bring your home blood pressure machine with you to a provider's office for accuracy comparison at least once a year.   Make sure you take your blood pressure medications before you come to any office visit, even if you were asked to fast for labs.  Elisabeth Delles, PharmD, BCACP [facility] [phone]  

## 2024-04-02 ENCOUNTER — Other Ambulatory Visit: Payer: Self-pay | Admitting: Family Medicine

## 2024-04-02 ENCOUNTER — Ambulatory Visit: Admitting: Family Medicine

## 2024-04-02 ENCOUNTER — Encounter: Payer: Self-pay | Admitting: Family Medicine

## 2024-04-02 VITALS — BP 147/72 | HR 66 | Temp 98.2°F | Ht 60.0 in | Wt 138.4 lb

## 2024-04-02 DIAGNOSIS — I152 Hypertension secondary to endocrine disorders: Secondary | ICD-10-CM | POA: Diagnosis not present

## 2024-04-02 DIAGNOSIS — R1319 Other dysphagia: Secondary | ICD-10-CM | POA: Diagnosis not present

## 2024-04-02 DIAGNOSIS — K219 Gastro-esophageal reflux disease without esophagitis: Secondary | ICD-10-CM | POA: Diagnosis not present

## 2024-04-02 DIAGNOSIS — N3941 Urge incontinence: Secondary | ICD-10-CM | POA: Diagnosis not present

## 2024-04-02 DIAGNOSIS — E039 Hypothyroidism, unspecified: Secondary | ICD-10-CM | POA: Diagnosis not present

## 2024-04-02 DIAGNOSIS — G5601 Carpal tunnel syndrome, right upper limb: Secondary | ICD-10-CM | POA: Diagnosis not present

## 2024-04-02 DIAGNOSIS — E1159 Type 2 diabetes mellitus with other circulatory complications: Secondary | ICD-10-CM | POA: Diagnosis not present

## 2024-04-02 DIAGNOSIS — Z23 Encounter for immunization: Secondary | ICD-10-CM

## 2024-04-02 DIAGNOSIS — E1140 Type 2 diabetes mellitus with diabetic neuropathy, unspecified: Secondary | ICD-10-CM

## 2024-04-02 DIAGNOSIS — E1165 Type 2 diabetes mellitus with hyperglycemia: Secondary | ICD-10-CM | POA: Diagnosis not present

## 2024-04-02 DIAGNOSIS — E78019 Familial hypercholesterolemia, unspecified: Secondary | ICD-10-CM | POA: Diagnosis not present

## 2024-04-02 DIAGNOSIS — J301 Allergic rhinitis due to pollen: Secondary | ICD-10-CM

## 2024-04-02 DIAGNOSIS — Z7984 Long term (current) use of oral hypoglycemic drugs: Secondary | ICD-10-CM | POA: Diagnosis not present

## 2024-04-02 MED ORDER — OZEMPIC (0.25 OR 0.5 MG/DOSE) 2 MG/3ML ~~LOC~~ SOPN: 0.5000 mg | PEN_INJECTOR | SUBCUTANEOUS | 2 refills | Status: AC

## 2024-04-02 MED ORDER — FAMOTIDINE 40 MG PO TABS: 40.0000 mg | ORAL_TABLET | Freq: Every day | ORAL | 1 refills | Status: AC

## 2024-04-02 MED ORDER — TOLTERODINE TARTRATE 1 MG PO TABS: 1.0000 mg | ORAL_TABLET | Freq: Two times a day (BID) | ORAL | 1 refills | Status: AC

## 2024-04-02 MED ORDER — LISINOPRIL 10 MG PO TABS: 10.0000 mg | ORAL_TABLET | Freq: Every day | ORAL | 1 refills | Status: AC

## 2024-04-02 MED ORDER — ATORVASTATIN CALCIUM 80 MG PO TABS: 80.0000 mg | ORAL_TABLET | Freq: Every day | ORAL | 1 refills | Status: AC

## 2024-04-02 MED ORDER — METOPROLOL SUCCINATE ER 50 MG PO TB24: 50.0000 mg | ORAL_TABLET | Freq: Every day | ORAL | 1 refills | Status: AC

## 2024-04-02 MED ORDER — LEVOTHYROXINE SODIUM 100 MCG PO TABS: 100.0000 ug | ORAL_TABLET | Freq: Every day | ORAL | 1 refills | Status: AC

## 2024-04-02 MED ORDER — HYDROCHLOROTHIAZIDE 25 MG PO TABS: 25.0000 mg | ORAL_TABLET | Freq: Every day | ORAL | 1 refills | Status: AC

## 2024-04-03 NOTE — Telephone Encounter (Signed)
 Requested medication (s) are due for refill today: yes  Requested medication (s) are on the active medication list: yes  Last refill:  10/15/23  Future visit scheduled: {Yes/  Notes to clinic:  routing for review, should patient continue to take     Requested Prescriptions  Pending Prescriptions Disp Refills   fluticasone  (FLONASE ) 50 MCG/ACT nasal spray [Pharmacy Med Name: FLUTICASONE  PROP 50 MCG SPRAY] 48 mL 0    Sig: SPRAY 2 SPRAYS INTO EACH NOSTRIL EVERY DAY     Ear, Nose, and Throat: Nasal Preparations - Corticosteroids Failed - 04/03/2024  3:24 PM      Failed - Valid encounter within last 12 months    Recent Outpatient Visits   None

## 2024-04-05 DIAGNOSIS — E1165 Type 2 diabetes mellitus with hyperglycemia: Secondary | ICD-10-CM | POA: Insufficient documentation

## 2024-04-05 DIAGNOSIS — G5600 Carpal tunnel syndrome, unspecified upper limb: Secondary | ICD-10-CM | POA: Insufficient documentation

## 2024-04-05 NOTE — Assessment & Plan Note (Signed)
 Will try GLP-1, Ozempic .  No family history of men 2 or thyroid  cancer.

## 2024-04-05 NOTE — Assessment & Plan Note (Signed)
 Diagnosis confirmed with nerve conduction study.  Reports it is not bothering her currently.

## 2024-04-05 NOTE — Progress Notes (Signed)
 Established Patient Office Visit  Subjective   Patient ID: Mary Nicholson, female    DOB: 04-01-1956  Age: 68 y.o. MRN: [id number]  Chief Complaint  Patient presents with   Follow-up   Medication Refill    Medication Refill   Discussed the use of AI scribe software for clinical note transcription with the patient, who gave verbal consent to proceed.  History of Present Illness   Mary Nicholson is a 68 year old female who presents for medication refill and follow-up on carpal tunnel syndrome.  She ran out of her medications, including Synthroid , about four to five weeks ago. She attempted to refill them at [facility] but was informed there were no prescriptions left. She provided a list of medications with zero refills.   She had nerve conduction studies done, and the radiologist told her she had the beginning of carpal tunnel syndrome. She has not yet seen her neurosurgeon but was informed by the radiologist about the condition. Currently, her carpal tunnel symptoms are not significantly bothersome.  She is taking Ozempic , which has significantly reduced her appetite. She reports variable hunger levels and has lost weight, dropping from approximately 145-144 pounds to 138 pounds. No adverse effects from Ozempic , and she denies feeling sick from it.  Her sodium levels have been low. She has started using a salt substitute, Morton, to manage this.  She lives with her older sister, who has COPD and is on oxygen and three breathing treatments.  She smokes but is attempting to reduce her smoking by going outside to smoke. She discussed using nicotine patches with her pharmacist and plans to start with a 21 mg patch.  She confirms receiving a flu shot but has not yet had a pneumonia shot.         Objective:     BP (!) 147/72   Pulse 66   Temp 98.2 F (36.8 C) (Oral)   Ht 5' (1.524 m)   Wt 138 lb 6 oz (62.8 kg)   SpO2 94%   BMI 27.02 kg/m    Physical Exam Vitals  and nursing note reviewed.  Constitutional:      Appearance: Normal appearance.  HENT:     Head: Normocephalic and atraumatic.  Eyes:     Conjunctiva/sclera: Conjunctivae normal.  Cardiovascular:     Rate and Rhythm: Normal rate and regular rhythm.  Pulmonary:     Effort: Pulmonary effort is normal.     Breath sounds: Normal breath sounds.  Musculoskeletal:     Right lower leg: No edema.     Left lower leg: No edema.  Skin:    General: Skin is warm and dry.  Neurological:     Mental Status: She is alert and oriented to person, place, and time.  Psychiatric:        Mood and Affect: Mood normal.        Behavior: Behavior normal.        Thought Content: Thought content normal.        Judgment: Judgment normal.   Reports is not      No results found for any visits on 04/02/24.    The ASCVD Risk score (Arnett DK, et al., 2019) failed to calculate for the following reasons:   Risk score cannot be calculated because patient has a medical history suggesting prior/existing ASCVD   * - Cholesterol units were assumed    Assessment & Plan:  Hypertension associated with diabetes (HCC) -  hydroCHLOROthiazide ; Take 1 tablet (25 mg total) by mouth daily.  Dispense: 90 tablet; Refill: 1 -     Metoprolol  Succinate ER; Take 1 tablet (50 mg total) by mouth daily.  Dispense: 90 tablet; Refill: 1 -     Lisinopril ; Take 1 tablet (10 mg total) by mouth daily.  Dispense: 90 tablet; Refill: 1  Familial hypercholesterolemia -     Atorvastatin  Calcium ; Take 1 tablet (80 mg total) by mouth daily.  Dispense: 90 tablet; Refill: 1  Esophageal dysphagia -     Famotidine ; Take 1 tablet (40 mg total) by mouth daily.  Dispense: 90 tablet; Refill: 1  Gastroesophageal reflux disease without esophagitis -     Famotidine ; Take 1 tablet (40 mg total) by mouth daily.  Dispense: 90 tablet; Refill: 1  Acquired hypothyroidism -     Levothyroxine  Sodium; Take 1 tablet (100 mcg total) by mouth daily.   Dispense: 90 tablet; Refill: 1  Urgency incontinence -     Tolterodine  Tartrate; Take 1 tablet (1 mg total) by mouth 2 (two) times daily.  Dispense: 180 tablet; Refill: 1  Type 2 diabetes mellitus with diabetic neuropathy, without long-term current use of insulin  [facility]) Assessment & Plan: Will try GLP-1, Ozempic .  No family history of men 2 or thyroid  cancer.  Orders: -     Ozempic  (0.25 or 0.5 MG/DOSE); Inject 0.5 mg into the skin once a week.  Dispense: 3 mL; Refill: 2  Need for pneumococcal 20-valent conjugate vaccination -     Pneumococcal conjugate vaccine 20-valent  Carpal tunnel syndrome of right wrist Assessment & Plan: Diagnosis confirmed with nerve conduction study.  Reports it is not bothering her currently.   Type 2 diabetes mellitus with hyperglycemia, without long-term current use of insulin  (HCC)     Return in about 3 months (around 07/01/2024).    Mary Nicholson K Mary Kirstein, MD

## 2024-04-27 ENCOUNTER — Ambulatory Visit: Admitting: Family Medicine

## 2024-05-20 ENCOUNTER — Other Ambulatory Visit: Payer: Self-pay | Admitting: Family Medicine

## 2024-05-20 DIAGNOSIS — J449 Chronic obstructive pulmonary disease, unspecified: Secondary | ICD-10-CM

## 2024-05-21 NOTE — Telephone Encounter (Signed)
 Requested medication (s) are due for refill today: yes  Requested medication (s) are on the active medication list: yes  Last refill:  10/15/23  Future visit scheduled: {Yes  Notes to clinic:   Medication not assigned to a protocol, review manually.      Requested Prescriptions  Pending Prescriptions Disp Refills   TRELEGY ELLIPTA  100-62.5-25 MCG/ACT AEPB [Pharmacy Med Name: TRELEGY ELLIPTA  100-62.5-25] 60 each 0    Sig: INHALE 1 PUFF BY MOUTH EVERY DAY     Off-Protocol Failed - 05/21/2024 11:50 AM      Failed - Medication not assigned to a protocol, review manually.      Failed - Valid encounter within last 12 months    Recent Outpatient Visits   None

## 2024-05-21 NOTE — Telephone Encounter (Signed)
 Pease review sent to the wrong office.  KP

## 2024-05-25 ENCOUNTER — Ambulatory Visit: Admitting: Family Medicine

## 2024-05-26 ENCOUNTER — Ambulatory Visit: Admitting: Family Medicine

## 2024-05-26 ENCOUNTER — Encounter: Payer: Self-pay | Admitting: Family Medicine

## 2024-05-26 VITALS — BP 157/76 | HR 85 | Ht 60.0 in | Wt 129.0 lb

## 2024-05-26 DIAGNOSIS — N898 Other specified noninflammatory disorders of vagina: Secondary | ICD-10-CM | POA: Diagnosis not present

## 2024-05-26 DIAGNOSIS — E663 Overweight: Secondary | ICD-10-CM

## 2024-05-26 DIAGNOSIS — Z113 Encounter for screening for infections with a predominantly sexual mode of transmission: Secondary | ICD-10-CM | POA: Diagnosis not present

## 2024-05-27 DIAGNOSIS — N898 Other specified noninflammatory disorders of vagina: Secondary | ICD-10-CM | POA: Insufficient documentation

## 2024-05-27 DIAGNOSIS — E663 Overweight: Secondary | ICD-10-CM | POA: Insufficient documentation

## 2024-05-27 NOTE — Assessment & Plan Note (Signed)
 White, heavy, thick mucus progressed to greenish, thick and heavy discharge no associated itching, burning, fever or dysuria.  Had her collect vaginal swab screening for GC chlamydia and trichomoniasis.  If this is negative then we will get a general vaginal culture

## 2024-05-27 NOTE — Assessment & Plan Note (Signed)
 Is on Ozempic  0.5 mg weekly now with a total weight loss of 22 pounds.  She is down to 127 pounds.  She would like to lose about 10 more pounds.  Continue Ozempic  0.5 mg weekly.

## 2024-05-29 LAB — CHLAMYDIA/GONOCOCCUS/TRICHOMONAS, NAA
Chlamydia by NAA: NEGATIVE
Gonococcus by NAA: NEGATIVE
Trich vag by NAA: POSITIVE — AB

## 2024-07-01 ENCOUNTER — Ambulatory Visit: Admitting: Family Medicine
